# Patient Record
Sex: Female | Born: 1985 | Race: White | Hispanic: No | Marital: Married
Health system: Midwestern US, Academic
[De-identification: ages and names within clinical notes are randomized; demographics above are authoritative.]

---

## 2016-07-14 MED ORDER — ZOLPIDEM 10 MG PO TAB
ORAL_TABLET | Freq: Every day | ORAL | 0 refills | 30.00000 days | Status: DC | PRN
Start: 2016-07-14 — End: 2016-11-15

## 2016-07-21 MED ORDER — LIDOCAINE (PF) 10 MG/ML (1 %) IJ SOLN
5 mL | Freq: Once | SUBCUTANEOUS | 0 refills | Status: CP
Start: 2016-07-21 — End: ?

## 2016-07-21 MED ORDER — GOSERELIN 3.6 MG SC IMPL
3.6 mg | Freq: Once | SUBCUTANEOUS | 0 refills | Status: CP
Start: 2016-07-21 — End: ?

## 2016-08-16 MED ORDER — TAMOXIFEN 20 MG PO TAB
ORAL_TABLET | Freq: Every day | 1 refills | Status: DC
Start: 2016-08-16 — End: 2017-02-06

## 2016-08-19 MED ORDER — LIDOCAINE (PF) 10 MG/ML (1 %) IJ SOLN
5 mL | Freq: Once | SUBCUTANEOUS | 0 refills | Status: CP
Start: 2016-08-19 — End: ?

## 2016-08-19 MED ORDER — GOSERELIN 3.6 MG SC IMPL
3.6 mg | Freq: Once | SUBCUTANEOUS | 0 refills | Status: CP
Start: 2016-08-19 — End: ?

## 2016-09-12 ENCOUNTER — Encounter: Admit: 2016-09-12 | Discharge: 2016-09-12 | Payer: BC Managed Care – PPO

## 2016-09-12 DIAGNOSIS — C50919 Malignant neoplasm of unspecified site of unspecified female breast: Principal | ICD-10-CM

## 2016-09-12 NOTE — Telephone Encounter
Debra Fleming called wanting to change Debra Fleming Zoladex injection on Friday, June 22nd to Monday, June 25th. Appointment was rescheduled to 09/19/16 at 1100. Patient voiced understanding and had no further questions.

## 2016-09-13 ENCOUNTER — Ambulatory Visit: Admit: 2016-10-12 | Discharge: 2016-10-12 | Payer: BC Managed Care – PPO

## 2016-09-13 DIAGNOSIS — C50919 Malignant neoplasm of unspecified site of unspecified female breast: Secondary | ICD-10-CM

## 2016-09-19 ENCOUNTER — Encounter: Admit: 2016-09-19 | Discharge: 2016-09-19 | Payer: BC Managed Care – PPO

## 2016-09-19 DIAGNOSIS — Z5111 Encounter for antineoplastic chemotherapy: Principal | ICD-10-CM

## 2016-09-19 DIAGNOSIS — C50411 Malignant neoplasm of upper-outer quadrant of right female breast: ICD-10-CM

## 2016-09-19 DIAGNOSIS — Z9013 Acquired absence of bilateral breasts and nipples: ICD-10-CM

## 2016-09-19 DIAGNOSIS — Z79810 Long term (current) use of selective estrogen receptor modulators (SERMs): ICD-10-CM

## 2016-09-19 DIAGNOSIS — Z170 Estrogen receptor positive status [ER+]: ICD-10-CM

## 2016-09-19 DIAGNOSIS — C50511 Malignant neoplasm of lower-outer quadrant of right female breast: ICD-10-CM

## 2016-09-19 DIAGNOSIS — C50811 Malignant neoplasm of overlapping sites of right female breast: ICD-10-CM

## 2016-09-19 MED ORDER — LIDOCAINE (PF) 10 MG/ML (1 %) IJ SOLN
5 mL | Freq: Once | SUBCUTANEOUS | 0 refills | Status: CP
Start: 2016-09-19 — End: ?
  Administered 2016-09-19: 16:00:00 2 mL via SUBCUTANEOUS

## 2016-09-19 MED ORDER — GOSERELIN 3.6 MG SC IMPL
3.6 mg | Freq: Once | SUBCUTANEOUS | 0 refills | Status: CP
Start: 2016-09-19 — End: ?
  Administered 2016-09-19: 16:00:00 3.6 mg via SUBCUTANEOUS

## 2016-09-19 NOTE — Progress Notes
Patient received Zoladex injection and tolerated without difficulty.  No pertinent changes since last assessment.

## 2016-09-19 NOTE — Progress Notes
[facility] phone triage updates complete with patient for surgery on 10/12/16 with Dr. Rolanda Jay. She denies any major changes to medical hx or functional status since her last surgery on 04/01/16. She tolerated anesthesia well with that surgery but has a hx of severe PONV. Pt does not meet phone triage criteria for a [facility] appointment at this time.    Preoperative and medication instructions reviewed. Reminded no NSAIDs 7 days before surgery. NPO after 11 PM, may have water until two hours prior to check-in. Okay to take gabapentin, Tamoxifen, venlafaxine that morning with a sip of water as well. Pt verbalized understanding, declined needing a copy of these instructions sent to her.

## 2016-09-26 ENCOUNTER — Ambulatory Visit: Admit: 2016-09-26 | Discharge: 2016-09-26 | Payer: BC Managed Care – PPO

## 2016-09-26 ENCOUNTER — Encounter: Admit: 2016-09-26 | Discharge: 2016-09-26 | Payer: BC Managed Care – PPO

## 2016-09-26 DIAGNOSIS — N650 Deformity of reconstructed breast: ICD-10-CM

## 2016-09-26 DIAGNOSIS — K929 Disease of digestive system, unspecified: ICD-10-CM

## 2016-09-26 DIAGNOSIS — Z9221 Personal history of antineoplastic chemotherapy: ICD-10-CM

## 2016-09-26 DIAGNOSIS — Z789 Other specified health status: ICD-10-CM

## 2016-09-26 DIAGNOSIS — R112 Nausea with vomiting, unspecified: ICD-10-CM

## 2016-09-26 DIAGNOSIS — K219 Gastro-esophageal reflux disease without esophagitis: ICD-10-CM

## 2016-09-26 DIAGNOSIS — Z421 Encounter for breast reconstruction following mastectomy: Principal | ICD-10-CM

## 2016-09-26 DIAGNOSIS — C50911 Malignant neoplasm of unspecified site of right female breast: Principal | ICD-10-CM

## 2016-09-26 MED ORDER — DIAZEPAM 5 MG PO TAB
5 mg | ORAL_TABLET | ORAL | 0 refills | 7.00000 days | Status: AC | PRN
Start: 2016-09-26 — End: 2017-11-02

## 2016-09-26 MED ORDER — HYDROCODONE-ACETAMINOPHEN 5-325 MG PO TAB
1-2 | ORAL_TABLET | ORAL | 0 refills | 15.00000 days | Status: AC | PRN
Start: 2016-09-26 — End: 2017-06-26

## 2016-09-26 NOTE — Progress Notes
Debra Fleming is a 31 y.o. female.    Subjective:            Cancer Staging  Malignant neoplasm of upper-outer quadrant of right female breast [facility])  Staging form: Breast, AJCC 7th Edition  - Clinical: Stage IIA (T2, N0, cM0) - Signed by Cordelia Poche, MD on 09/26/2014  - Pathologic: Stage IIA (T2, N0, cM0) - Signed by Cordelia Poche, MD on 10/31/2014      HPI  Bilateral fat grafting from abdomen and thighs to breasts (right 280 cc and left 260 cc) with left breast mound revision and right breast reconstruction  (Bilateral)  DOS 04/01/16  Bilateral breast reconstruction with [id number] 05/04/2015  Have discussed ALCL  Anxious to exchange for larger implants that are smooth.  Review of Systems   Constitutional: Negative.    HENT: Negative.    Eyes: Negative.    Respiratory: Negative.    Cardiovascular: Negative.    Gastrointestinal: Negative.    Endocrine: Negative.    Genitourinary: Negative.    Musculoskeletal: Negative.    Skin: Negative.    Allergic/Immunologic: Negative.    Neurological: Negative.    Hematological: Negative.    Psychiatric/Behavioral: Negative.        Current Medication List:  ??? acetaminophen (TYLENOL) 500 mg tablet Take 500-1,000 mg by mouth every 6 hours as needed for Pain. Max of 4,000 mg of acetaminophen in 24 hours.   ??? amitriptyline (ELAVIL) 25 mg tablet Take 25 mg by mouth at bedtime as needed.   ??? diazePAM (VALIUM) 5 mg tablet Take 1 tablet by mouth every 8 hours as needed for Anxiety. Indications: MUSCLE SPASM   ??? gabapentin (NEURONTIN) 100 mg capsule Take 2 capsules by mouth every 8 hours.   ??? gabapentin (NEURONTIN) 300 mg capsule Take 3 capsules by mouth at bedtime daily.   ??? goserelin (ZOLADEX) 3.6 mg implant syringe Inject 3.6 mg under the skin once.   ??? LIDOCAINE HCL/PF (LIDOCAINE (PF) IJ) Inject  to area(s) as directed.   ??? SUMAtriptan succinate (IMITREX) 50 mg tablet Take 50 mg by mouth as Needed for Migraine symptoms. Dose may be repeated in 2 hours if needed. Max of 2 tablets in 24 hours.   ??? tamoxifen (NOLVADEX) 20 mg tablet TAKE ONE TABLET BY MOUTH ONCE DAILY   ??? venlafaxine XR (EFFEXOR XR) 37.5 mg capsule Take 1 capsule by mouth daily. Take with food.   ??? venlafaxine XR (EFFEXOR XR) 75 mg capsule Take 1 capsule by mouth daily. Take with food.   ??? zolpidem (AMBIEN) 10 mg tablet TAKE 1 TABLET BY MOUTH DAILY AT BEDTIME AS NEEDED FOR SLEEP     Vitals:    09/26/16 1131   BP: (!) 140/95   Pulse: 79   Weight: 89.4 kg (197 lb)   Height: 168.9 cm (66.5)     Body mass index is 31.32 kg/m???.       Physical Exam   Constitutional: She appears well-developed and well-nourished.   HENT:   Head: Normocephalic.   Neck: Normal range of motion. Neck supple.   Cardiovascular: Normal rate and regular rhythm.    Pulmonary/Chest: Effort normal and breath sounds normal.   Bilateral mastectomy with implants  17-18 cm max breast base width  Good soft tissue envelope  Axillary fold soft tissue excess   Abdominal: Soft. Bowel sounds are normal.   Psychiatric: She has a normal mood and affect. Her behavior is normal.   Nursing note and vitals reviewed.  Assessment and Plan:    The plan will be to proceed with bilateral breast mound revisions with implant exchange and liposuction of the axillary folds. The implants available will be Natrelle SCX-700, SCX-750 and SCX-800.  Will need 3 of each.  We discussed the expected result as well as the recovery.  Informed consent paperwork has been supplied.  She will be followed up in the office approximately 2 weeks postoperatively.

## 2016-10-11 ENCOUNTER — Ambulatory Visit: Admit: 2016-10-11 | Discharge: 2016-10-11 | Payer: BC Managed Care – PPO

## 2016-10-11 DIAGNOSIS — Z421 Encounter for breast reconstruction following mastectomy: Principal | ICD-10-CM

## 2016-10-12 ENCOUNTER — Encounter: Admit: 2016-10-12 | Discharge: 2016-10-12 | Payer: BC Managed Care – PPO

## 2016-10-12 ENCOUNTER — Ambulatory Visit: Admit: 2016-10-12 | Discharge: 2016-10-12 | Payer: BC Managed Care – PPO

## 2016-10-12 DIAGNOSIS — K929 Disease of digestive system, unspecified: ICD-10-CM

## 2016-10-12 DIAGNOSIS — Z9013 Acquired absence of bilateral breasts and nipples: ICD-10-CM

## 2016-10-12 DIAGNOSIS — Z9889 Other specified postprocedural states: ICD-10-CM

## 2016-10-12 DIAGNOSIS — Z789 Other specified health status: ICD-10-CM

## 2016-10-12 DIAGNOSIS — L987 Excessive and redundant skin and subcutaneous tissue: ICD-10-CM

## 2016-10-12 DIAGNOSIS — Z9221 Personal history of antineoplastic chemotherapy: ICD-10-CM

## 2016-10-12 DIAGNOSIS — Z421 Encounter for breast reconstruction following mastectomy: Principal | ICD-10-CM

## 2016-10-12 DIAGNOSIS — Z880 Allergy status to penicillin: ICD-10-CM

## 2016-10-12 DIAGNOSIS — C50911 Malignant neoplasm of unspecified site of right female breast: Principal | ICD-10-CM

## 2016-10-12 DIAGNOSIS — R112 Nausea with vomiting, unspecified: ICD-10-CM

## 2016-10-12 DIAGNOSIS — Z853 Personal history of malignant neoplasm of breast: ICD-10-CM

## 2016-10-12 DIAGNOSIS — K219 Gastro-esophageal reflux disease without esophagitis: ICD-10-CM

## 2016-10-12 LAB — PREGNANCY TEST-URINE
Lab: 1
Lab: NEGATIVE

## 2016-10-12 MED ORDER — LIDOCAINE (PF) 200 MG/10 ML (2 %) IJ SYRG
0 refills | Status: DC
Start: 2016-10-12 — End: 2016-10-12
  Administered 2016-10-12: 16:00:00 100 mg via INTRAVENOUS

## 2016-10-12 MED ORDER — OXYCODONE 5 MG PO TAB
5-10 mg | Freq: Once | ORAL | 0 refills | Status: CP | PRN
Start: 2016-10-12 — End: ?
  Administered 2016-10-12: 19:00:00 10 mg via ORAL

## 2016-10-12 MED ORDER — BUPIVACAINE-EPINEPHRINE 0.25 %-1:200,000 IJ SOLN
0 refills | Status: DC
Start: 2016-10-12 — End: 2016-10-12
  Administered 2016-10-12: 17:00:00 14 mL via INTRAMUSCULAR

## 2016-10-12 MED ORDER — FENTANYL CITRATE (PF) 50 MCG/ML IJ SOLN
25 ug | INTRAVENOUS | 0 refills | Status: DC | PRN
Start: 2016-10-12 — End: 2016-10-12

## 2016-10-12 MED ORDER — DIPHENHYDRAMINE HCL 50 MG/ML IJ SOLN
25 mg | Freq: Once | INTRAVENOUS | 0 refills | Status: DC | PRN
Start: 2016-10-12 — End: 2016-10-12

## 2016-10-12 MED ORDER — ORTHO IRRIGATION BOTTLE
0 refills | Status: DC
Start: 2016-10-12 — End: 2016-10-12
  Administered 2016-10-12 (×3): 1000 mL

## 2016-10-12 MED ORDER — CLINDAMYCIN IN 5 % DEXTROSE 900 MG/50 ML IV PGBK
0 refills | Status: DC
Start: 2016-10-12 — End: 2016-10-12
  Administered 2016-10-12: 17:00:00 900 mg via INTRAVENOUS

## 2016-10-12 MED ORDER — PROPOFOL 10 MG/ML IV EMUL (INFUSION)(AM)(OR)
INTRAVENOUS | 0 refills | Status: DC
Start: 2016-10-12 — End: 2016-10-12
  Administered 2016-10-12: 16:00:00 125 ug/kg/min via INTRAVENOUS

## 2016-10-12 MED ORDER — MUPIROCIN 2 % TP OINT
0 refills | Status: DC
Start: 2016-10-12 — End: 2016-10-12
  Administered 2016-10-12: 18:00:00 1 via TOPICAL

## 2016-10-12 MED ORDER — MEPERIDINE (PF) 25 MG/ML IJ SYRG
12.5 mg | INTRAVENOUS | 0 refills | Status: DC | PRN
Start: 2016-10-12 — End: 2016-10-12

## 2016-10-12 MED ORDER — KETAMINE 10 MG/ML IJ SOLN
0 refills | Status: DC
Start: 2016-10-12 — End: 2016-10-12
  Administered 2016-10-12: 17:00:00 10 mg via INTRAVENOUS
  Administered 2016-10-12: 17:00:00 20 mg via INTRAVENOUS

## 2016-10-12 MED ORDER — LACTATED RINGERS IV SOLP
1000 mL | INTRAVENOUS | 0 refills | Status: DC
Start: 2016-10-12 — End: 2016-10-12
  Administered 2016-10-12: 18:00:00 1000.000 mL via INTRAVENOUS

## 2016-10-12 MED ORDER — MIDAZOLAM 1 MG/ML IJ SOLN
INTRAVENOUS | 0 refills | Status: DC
Start: 2016-10-12 — End: 2016-10-12
  Administered 2016-10-12: 16:00:00 2 mg via INTRAVENOUS

## 2016-10-12 MED ORDER — LIDOCAINE (PF) 1% 30 ML/EPINEPHRINE 1MG/LR 1000 ML IRR (OR)
Freq: Once | 0 refills | Status: DC
Start: 2016-10-12 — End: 2016-10-12

## 2016-10-12 MED ORDER — PROMETHAZINE 25 MG/ML IJ SOLN
6.25 mg | INTRAVENOUS | 0 refills | Status: DC | PRN
Start: 2016-10-12 — End: 2016-10-12

## 2016-10-12 MED ORDER — FENTANYL CITRATE (PF) 50 MCG/ML IJ SOLN
50 ug | INTRAVENOUS | 0 refills | Status: DC | PRN
Start: 2016-10-12 — End: 2016-10-12
  Administered 2016-10-12: 19:00:00 50 ug via INTRAVENOUS

## 2016-10-12 MED ORDER — CEFAZOLIN INJ 1GM IVP
2 g | Freq: Once | INTRAVENOUS | 0 refills | Status: DC
Start: 2016-10-12 — End: 2016-10-12

## 2016-10-12 MED ORDER — ONDANSETRON HCL (PF) 4 MG/2 ML IJ SOLN
INTRAVENOUS | 0 refills | Status: DC
Start: 2016-10-12 — End: 2016-10-12
  Administered 2016-10-12: 18:00:00 4 mg via INTRAVENOUS

## 2016-10-12 MED ORDER — LIDOCAINE (PF) 1% 30 ML/EPINEPHRINE 1MG/LR 1000 ML IRR (OR)
0 refills | Status: DC
Start: 2016-10-12 — End: 2016-10-12
  Administered 2016-10-12 (×3): 300 mL

## 2016-10-12 MED ORDER — HALOPERIDOL LACTATE 5 MG/ML IJ SOLN
1 mg | Freq: Once | INTRAVENOUS | 0 refills | Status: DC | PRN
Start: 2016-10-12 — End: 2016-10-12

## 2016-10-12 MED ORDER — HYDROMORPHONE (PF) 2 MG/ML IJ SYRG
1 mg | INTRAVENOUS | 0 refills | Status: DC | PRN
Start: 2016-10-12 — End: 2016-10-12

## 2016-10-12 MED ORDER — FENTANYL CITRATE (PF) 50 MCG/ML IJ SOLN
0 refills | Status: DC
Start: 2016-10-12 — End: 2016-10-12
  Administered 2016-10-12 (×4): 25 ug via INTRAVENOUS
  Administered 2016-10-12 (×2): 50 ug via INTRAVENOUS

## 2016-10-12 MED ORDER — ACETAMINOPHEN 500 MG PO TAB
1000 mg | Freq: Once | ORAL | 0 refills | Status: CP
Start: 2016-10-12 — End: ?
  Administered 2016-10-12: 15:00:00 1000 mg via ORAL

## 2016-10-12 MED ORDER — LIDOCAINE (PF) 10 MG/ML (1 %) IJ SOLN
.1-2 mL | INTRAMUSCULAR | 0 refills | Status: DC | PRN
Start: 2016-10-12 — End: 2016-10-12

## 2016-10-12 MED ORDER — DEXAMETHASONE SODIUM PHOSPHATE 4 MG/ML IJ SOLN
INTRAVENOUS | 0 refills | Status: DC
Start: 2016-10-12 — End: 2016-10-12
  Administered 2016-10-12: 18:00:00 4 mg via INTRAVENOUS

## 2016-10-12 MED ORDER — PROPOFOL INJ 10 MG/ML IV VIAL
INTRAVENOUS | 0 refills | Status: DC
Start: 2016-10-12 — End: 2016-10-12
  Administered 2016-10-12: 17:00:00 70 mg via INTRAVENOUS
  Administered 2016-10-12: 16:00:00 10 mg via INTRAVENOUS

## 2016-10-12 MED ADMIN — LACTATED RINGERS IV SOLP [4318]: 1000 mL | INTRAVENOUS | @ 15:00:00 | Stop: 2016-10-12 | NDC 00338011704

## 2016-10-12 NOTE — Anesthesia Post-Procedure Evaluation
Post-Anesthesia Evaluation    Name: Debra Fleming      MRN: [id number]     DOB: 1985/04/10     Age: 31 y.o.     Sex: female   __________________________________________________________________________     Procedure Date: 10/12/2016  Procedure: Procedure(s) with comments:  BILATERAL BREAST REVISION WITH CASULOTOMY AND IMPLANT EXCHANGE - CASE LENGTH 2 HOURS  LIPOSUCTION TO AXILLARY FOLDS      Surgeon: Surgeon(s):  Marylyn Ishihara, MD  Korentager, Nanine Means, MD    Post-Anesthesia Vitals  BP: 147/81 (07/18 1345)  Temp: 36.6 C (97.9 F) (07/18 1322)  Pulse: 92 (07/18 1345)  Respirations: 16 PER MINUTE (07/18 1345)  SpO2: 100 % (07/18 1345)  O2 Delivery: None (Room Air) (07/18 1345)  SpO2 Pulse: 95 (07/18 1345)      Post Anesthesia Evaluation Note    Evaluation location: Pre/Post  Patient participation: recovered; patient participated in evaluation  Level of consciousness: alert  Pain management: adequate    Hydration: normovolemia  Temperature: 36.0C - 38.4C  Airway patency: adequate    Perioperative Events  Perioperative events:  no       Post-op nausea and vomiting: no PONV    Postoperative Status  Cardiovascular status: hemodynamically stable  Respiratory status: spontaneous ventilation  Follow-up needed: none      Perioperative Events  Perioperative Event: No

## 2016-10-12 NOTE — Progress Notes
Discharge instructions given to patient and family members. No further questions at this time. All verbalized understanding.

## 2016-10-12 NOTE — Operative Report(Direct Entry)
OPERATIVE REPORT    Name: Debra Fleming is a 31 y.o. female     DOB: 1985-10-05             MRN#: [id number]    DATE OF OPERATION: 10/12/2016    Surgeon(s) and Role:     Cleda Clarks, MD - Resident - Assisting     * Berna Gitto, Pearletha Furl, MD - Primary        Preoperative Diagnosis:    Malignant neoplasm of female breast, unspecified estrogen receptor status, unspecified laterality, unspecified site of breast (HCC) [C50.919]    Post-op Diagnosis      * Malignant neoplasm of female breast, unspecified estrogen receptor status, unspecified laterality, unspecified site of breast (HCC) [C50.919]    Procedure(s) (LRB):  BILATERAL BREAST MOUND REVISIONS WITH CAPSULOTOMIES AND IMPLANT EXCHANGE FOR NATRELLE INSPIRA SCX-800 (Bilateral)  LIPOSUCTION TO AXILLARY FOLDS (Bilateral)    Anesthesia Type: General    Description and Findings of Operative Procedure: The patient was taken to the operating room put to sleep with a general anesthetic and intubated with a laryngeal mask.  Initially on the left and then on the right the proposed incisions were infiltrated with 0.25% Marcaine with epinephrine.  The areas of the redundant adipose in the axillary fold region was infiltrated with tumescent solution.  Again first on the left and then on the right the old mastectomy scar was excised soft tissues divided exposing the capsule.  The capsule was entered and there was found to be a moderate amount of mucinous seroma fluid.  There is no indication of any infection.  The 410-615 implants show double capsule formation on the back wall.  They were removed and then the pockets copiously irrigated with antibiotic solution.  Medial superior and superior lateral capsulotomies were then completed.  The pockets were irrigated once again and any bleeding controlled with pressure and cautery.  Next the axillary fold regions were liposuction with a 3 mm cannula and a total of 300 cc was aspirated.  Approximately half of this was adipose.  Sizers were then used and I felt that the best option would be an 800 cc implant for her.  Two Natrelle Inspira cohesive breast implants were brought out onto the field.  They had a reference number of SCX???800 and the right had a lot number of [id number] and the left [id number].  They were placed in antibiotic solution.  After final check for hemostasis we changed gloves and reprepped with Betadine.  10 cc of the antibiotic Marcaine solution was placed into each pocket.  Implants were placed into their respective pockets.  This gave a very nice symmetric result.  Wounds were then closed utilizing 3-0 Monocryl running locking suture for the ADM that was well incorporated and 3-0 Monocryl as a deep dermal suture.  4 oh intracuticular Monocryl was used for skin.  The axillary stab incisions were closed with 4-0 Monocryl.  She was then placed in the dressing with Therabond and Tegaderm along with antibiotic ointment and Band-Aids over the axillary liposuction sites.  She was placed into a postoperative bra.  She was then awakened taken recovery in stable condition.    Estimated Blood Loss: min.     Specimen(s) Removed/Disposition:   ID Type Source Tests Collected by Time Destination   1 : breast implants for disposal only Tissue Breast SURGICAL PATHOLOGY          Evianna Chandran, Pearletha Furl, MD 10/12/2016 1240  Attestation: I performed this procedure with a resident.    Ardelle Anton, MD  Pager [phone]

## 2016-10-12 NOTE — Interval H&P Note
History and Physical Update Note    Allergies:  Other [unclassified drug] and Penicillin g    Lab/Radiology/Other Diagnostic Tests:  No pertinent labs  Point of Care Testing:  (Last 24 hours):     Marked and ready for surgery    I have examined the patient, and there are no significant changes in their condition, from the previous H&P performed on in the office.    Ardelle Anton, MD  Pager [phone]    --------------------------------------------------------------------------------------------------------------------------------------------

## 2016-10-13 ENCOUNTER — Encounter: Admit: 2016-10-13 | Discharge: 2016-10-13 | Payer: BC Managed Care – PPO

## 2016-10-13 MED ORDER — DOXYCYCLINE HYCLATE 100 MG PO TAB
100 mg | ORAL_TABLET | Freq: Two times a day (BID) | ORAL | 0 refills | 8.00000 days | Status: AC
Start: 2016-10-13 — End: ?

## 2016-10-14 ENCOUNTER — Ambulatory Visit: Admit: 2016-10-14 | Discharge: 2016-10-14 | Payer: BC Managed Care – PPO

## 2016-10-14 ENCOUNTER — Encounter: Admit: 2016-10-14 | Discharge: 2016-10-14 | Payer: BC Managed Care – PPO

## 2016-10-14 DIAGNOSIS — Z421 Encounter for breast reconstruction following mastectomy: Principal | ICD-10-CM

## 2016-10-14 DIAGNOSIS — K929 Disease of digestive system, unspecified: ICD-10-CM

## 2016-10-14 DIAGNOSIS — Z79810 Long term (current) use of selective estrogen receptor modulators (SERMs): ICD-10-CM

## 2016-10-14 DIAGNOSIS — K219 Gastro-esophageal reflux disease without esophagitis: ICD-10-CM

## 2016-10-14 DIAGNOSIS — R112 Nausea with vomiting, unspecified: ICD-10-CM

## 2016-10-14 DIAGNOSIS — Z789 Other specified health status: ICD-10-CM

## 2016-10-14 DIAGNOSIS — Z170 Estrogen receptor positive status [ER+]: ICD-10-CM

## 2016-10-14 DIAGNOSIS — Z9221 Personal history of antineoplastic chemotherapy: ICD-10-CM

## 2016-10-14 DIAGNOSIS — Z9013 Acquired absence of bilateral breasts and nipples: ICD-10-CM

## 2016-10-14 DIAGNOSIS — C50811 Malignant neoplasm of overlapping sites of right female breast: Principal | ICD-10-CM

## 2016-10-14 DIAGNOSIS — C50911 Malignant neoplasm of unspecified site of right female breast: Principal | ICD-10-CM

## 2016-10-14 DIAGNOSIS — C50511 Malignant neoplasm of lower-outer quadrant of right female breast: ICD-10-CM

## 2016-10-14 DIAGNOSIS — C50411 Malignant neoplasm of upper-outer quadrant of right female breast: Secondary | ICD-10-CM

## 2016-10-14 MED ORDER — LIDOCAINE (PF) 10 MG/ML (1 %) IJ SOLN
5 mL | Freq: Once | SUBCUTANEOUS | 0 refills | Status: CP
Start: 2016-10-14 — End: ?
  Administered 2016-10-14: 16:00:00 5 mL via SUBCUTANEOUS

## 2016-10-14 MED ORDER — GOSERELIN 3.6 MG SC IMPL
3.6 mg | Freq: Once | SUBCUTANEOUS | 0 refills | Status: CP
Start: 2016-10-14 — End: ?
  Administered 2016-10-14: 16:00:00 3.6 mg via SUBCUTANEOUS

## 2016-10-14 NOTE — Progress Notes
Patient received Zoladex and tolerated without difficulty.  No pertinent changes since last assessment.

## 2016-10-14 NOTE — Progress Notes
Subjective:  Debra Fleming presents to the clinic 1 weeks following BILATERAL BREAST MOUND REVISIONS WITH CAPSULOTOMIES AND IMPLANT EXCHANGE FOR NATRELLE INSPIRA SCX-800 (Bilateral) . She is eating a regular diet without difficulty. Bowel movement are Normal. Pain is controlled with current analgesics.  Medication(s) being used: acetaminophen    Objective:  BP 126/87  - Pulse 102  - Ht 168.9 cm (66.5")  - Wt 89.4 kg (197 lb)  - BMI 31.32 kg/m   General: Appearance: alert and cooperative     Body Area:         Site: Breasts  Drains: No  Signs or symptoms of infection -             (erythema, induration, heat, pain): No  Seroma/hematoma: No  Drainage: yes - type:  serous and on the right likely coming from the axillary tumescent, amount -  minimally saturated  Periwound/surrounding skin: WNL  Incisions good, no separation, no necrotic edges     Assessment:  Doing well postoperatively.    Plan:  1. Continue any current medications.  2. Wound care: redressed.  3. Pt is to increase activities as tolerated.

## 2016-10-28 ENCOUNTER — Encounter: Admit: 2016-10-28 | Discharge: 2016-10-28 | Payer: BC Managed Care – PPO

## 2016-10-28 MED ORDER — FLUCONAZOLE 150 MG PO TAB
150 mg | ORAL_TABLET | Freq: Once | ORAL | 1 refills | 3.00000 days | Status: AC
Start: 2016-10-28 — End: ?

## 2016-10-31 ENCOUNTER — Ambulatory Visit: Admit: 2016-10-31 | Discharge: 2016-11-01 | Payer: BC Managed Care – PPO

## 2016-10-31 ENCOUNTER — Encounter: Admit: 2016-10-31 | Discharge: 2016-10-31 | Payer: BC Managed Care – PPO

## 2016-10-31 DIAGNOSIS — K929 Disease of digestive system, unspecified: ICD-10-CM

## 2016-10-31 DIAGNOSIS — K219 Gastro-esophageal reflux disease without esophagitis: ICD-10-CM

## 2016-10-31 DIAGNOSIS — R112 Nausea with vomiting, unspecified: ICD-10-CM

## 2016-10-31 DIAGNOSIS — Z9221 Personal history of antineoplastic chemotherapy: ICD-10-CM

## 2016-10-31 DIAGNOSIS — Z789 Other specified health status: ICD-10-CM

## 2016-10-31 DIAGNOSIS — C50911 Malignant neoplasm of unspecified site of right female breast: Principal | ICD-10-CM

## 2016-10-31 NOTE — Progress Notes
Debra Fleming is a 31 y.o. female.    Subjective:    HPI  BILATERAL BREAST MOUND REVISIONS WITH CAPSULOTOMIES AND IMPLANT EXCHANGE FOR NATRELLE INSPIRA SCX-800 (Bilateral)  LIPOSUCTION TO AXILLARY FOLDS (Bilateral)  Pleased and doing well  Review of Systems   Constitutional: Positive for diaphoresis and malaise/fatigue.        UNEXPECTED WEIGHT CHANGE   Psychiatric/Behavioral: The patient is nervous/anxious.    All other systems reviewed and are negative.      Current Outpatient Prescriptions   Medication Sig Dispense Refill   ??? amitriptyline (ELAVIL) 25 mg tablet Take 25 mg by mouth at bedtime as needed.     ??? diazePAM (VALIUM) 5 mg tablet Take 1 tablet by mouth every 8 hours as needed for Anxiety. 20 tablet 0   ??? gabapentin (NEURONTIN) 100 mg capsule Take 2 capsules by mouth every 8 hours. 120 capsule 3   ??? gabapentin (NEURONTIN) 300 mg capsule Take 3 capsules by mouth at bedtime daily. 270 capsule 2   ??? goserelin (ZOLADEX) 3.6 mg implant syringe Inject 3.6 mg under the skin once.     ??? HYDROcodone/acetaminophen (NORCO) 5/325 mg tablet Take 1-2 tablets by mouth every 4 hours as needed for Pain 25 tablet 0   ??? SUMAtriptan succinate (IMITREX) 50 mg tablet Take 50 mg by mouth as Needed for Migraine symptoms. Dose may be repeated in 2 hours if needed. Max of 2 tablets in 24 hours.     ??? tamoxifen (NOLVADEX) 20 mg tablet TAKE ONE TABLET BY MOUTH ONCE DAILY 90 tablet 1   ??? venlafaxine XR (EFFEXOR XR) 37.5 mg capsule Take 1 capsule by mouth daily. Take with food. 90 capsule 3   ??? venlafaxine XR (EFFEXOR XR) 75 mg capsule Take 1 capsule by mouth daily. Take with food. 90 capsule 3   ??? zolpidem (AMBIEN) 10 mg tablet TAKE 1 TABLET BY MOUTH DAILY AT BEDTIME AS NEEDED FOR SLEEP 30 tablet 0       Objective:    Vitals:    10/31/16 1621   BP: 135/83   Pulse: 87   Resp: 16   Temp: 36.7 ???C (98.1 ???F)   TempSrc: Oral   SpO2: 98%   Weight: 98.2 kg (216 lb 6.4 oz)   Height: 168.9 cm (66.5) Estimated body mass index is 34.4 kg/m??? as calculated from the following:    Height as of this encounter: 168.9 cm (66.5).    Weight as of this encounter: 98.2 kg (216 lb 6.4 oz).   Physical Exam  Shape and contour very good  Incisions good  Assessment:    No diagnosis found.    Plan:  Check in 4-6 months  No problem-specific Assessment & Plan notes found for this encounter.

## 2016-11-01 DIAGNOSIS — Z421 Encounter for breast reconstruction following mastectomy: Principal | ICD-10-CM

## 2016-11-08 ENCOUNTER — Encounter: Admit: 2016-11-08 | Discharge: 2016-11-08 | Payer: BC Managed Care – PPO

## 2016-11-10 ENCOUNTER — Encounter: Admit: 2016-11-10 | Discharge: 2016-11-10 | Payer: BC Managed Care – PPO

## 2016-11-10 NOTE — Telephone Encounter
Patient cancelling appointment today due to strep. Patient rescheduled to 8/23 patient accepted the date and time of the appointment and has no further questions at this time. Clinic notified of cancellation

## 2016-11-14 ENCOUNTER — Encounter: Admit: 2016-11-14 | Discharge: 2016-11-14 | Payer: BC Managed Care – PPO

## 2016-11-15 MED ORDER — GABAPENTIN 300 MG PO CAP
900 mg | ORAL_CAPSULE | Freq: Every evening | ORAL | 2 refills | Status: AC
Start: 2016-11-15 — End: 2017-07-14

## 2016-11-15 MED ORDER — ZOLPIDEM 10 MG PO TAB
ORAL_TABLET | Freq: Every day | ORAL | 3 refills | 30.00000 days | Status: AC | PRN
Start: 2016-11-15 — End: 2016-11-18

## 2016-11-16 ENCOUNTER — Encounter: Admit: 2016-11-16 | Discharge: 2016-11-16 | Payer: BC Managed Care – PPO

## 2016-11-17 ENCOUNTER — Encounter: Admit: 2016-11-17 | Discharge: 2016-11-17 | Payer: BC Managed Care – PPO

## 2016-11-17 DIAGNOSIS — C50811 Malignant neoplasm of overlapping sites of right female breast: Principal | ICD-10-CM

## 2016-11-17 DIAGNOSIS — C50411 Malignant neoplasm of upper-outer quadrant of right female breast: ICD-10-CM

## 2016-11-17 DIAGNOSIS — C50511 Malignant neoplasm of lower-outer quadrant of right female breast: ICD-10-CM

## 2016-11-17 DIAGNOSIS — Z79810 Long term (current) use of selective estrogen receptor modulators (SERMs): ICD-10-CM

## 2016-11-17 DIAGNOSIS — Z801 Family history of malignant neoplasm of trachea, bronchus and lung: ICD-10-CM

## 2016-11-17 DIAGNOSIS — Z170 Estrogen receptor positive status [ER+]: ICD-10-CM

## 2016-11-17 DIAGNOSIS — Z9013 Acquired absence of bilateral breasts and nipples: ICD-10-CM

## 2016-11-17 LAB — ESTRADIOL (E2): Lab: <20 pg/mL

## 2016-11-17 MED ORDER — LIDOCAINE (PF) 10 MG/ML (1 %) IJ SOLN
2 mL | Freq: Once | SUBCUTANEOUS | 0 refills | Status: CP
Start: 2016-11-17 — End: ?
  Administered 2016-11-17: 18:00:00 2 mL via SUBCUTANEOUS

## 2016-11-17 MED ORDER — GOSERELIN 3.6 MG SC IMPL
3.6 mg | Freq: Once | SUBCUTANEOUS | 0 refills | Status: CP
Start: 2016-11-17 — End: ?
  Administered 2016-11-17: 18:00:00 3.6 mg via SUBCUTANEOUS

## 2016-11-17 NOTE — Progress Notes
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  Debra Fleming felt a right breast lump in April 2016 and when it started to increase in size, she went to her PCP. Bilateral diagnostic mammogram 09/08/14 ([facility]) revealed a 2.3 cm irregular mass 9:00, 7 cm FTN in the right breast.  There were a few other focal symmetries in the right breast at 10:00, 12:00, and 6:00. In the left breast there was a 7 mm asymmetry in the lower inner quadrant, 7 cm FTN. Bilateral breast ultrasound 09/08/14 ([facility]) revealed in the right breast a 2.3 cm irregular hypoechoic mass at 9:00. There was also a 1.5 cm mass at 10:00, 7 cm FTN. Additional suspicious findings were noted at 6:00, 7 cm FTN measuring 7 mm and 12:00, 4 cm FTN measuring 5 mm. The right axilla appeared normal. In the left breast there was a 7 mm hypoechoic cluster of cysts with no internal vascularity. Right breast biopsy was recommended. A 6 month follow up left breast ultrasound was recommended (BI-RADS 5).      [facility]11[facility]14.  Bilateral breast US 09/11/14 revealed an irregular right breast mass measuring up to 3.5cm at 9:00, 7cm FTN.  There was also an 8mm area at 10:00, 5cm FTN, a 4mm area at 12:00 4cm FTN, 7mm focus at 6:00 4cm FTN, 1cm area at 12:00 2cm FTN, and at 12:30 subareolar ,measuring 2 cm x 0.5 cm.  The left breast revealed a 9x4x20mm mass at 9:30, 2cm FTN.     She had three core needle biopsies 09/11/14.  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  Right breast biopsy at 12:00, 2cm FTN revealed usual ductal hyperplasia, no evidence of malignancy.  Right breast biopsy at 9:00, 7cm FTN revealed grade II IDC (ER 85%, PR 98%, Her2 1+, Hi-67: 6%). She had a bilateral mastectomy on 10/22/14 which revealed multicentric right, grade II, IDC. Tumor #1 at 9:00 measured 2.3cm (ER 90%, PR 100%, Her2 2+, FISH: 1.5 with avg #Her2 signals 4.01 (negative), Ki-67: 6%).  Tumor #2 at 6:00 measured 6 mm (ER 96%, PR 100%, Her2 1+, Ki-67 7%).  Tumor #3 (found incidentally) in the Left outer quadrant measured 2mm (ER and PR 100%, HER2 IHC 2+, Ki-67 17%, HER2 FISH negative); 4 negative SLN.  There was a distance of 2.5cm between the first and second mass.  Distance between second and the third lesion are unknown.     She received 4 cycles of adjuvant AC (8/18-10/8/16) followed by Taxol 10/24-12/29/16 - last dose was dose dense.    PRESENT THERAPY: Tamoxifen (started 06/2015), zoladex 12/2015 (rise in E2)    CHANGES SINCE LAST INTERVENTION: Debra Fleming returns to clinic for follow-up. She continues on monthly zoladex and tamoxifen Fleming.         Review of Systems   Constitutional: Positive for fatigue (stable). Negative for unexpected weight change.        Hot flashes stable     HENT: Negative.    Eyes: Negative.  Negative for visual disturbance.   Respiratory: Negative.  Negative for cough and shortness of breath.    Cardiovascular: Negative.  Negative for  chest pain.   Gastrointestinal: Negative.  Negative for abdominal pain, constipation, diarrhea, nausea and vomiting.   Endocrine: Negative.    Genitourinary: Negative.  Negative for difficulty urinating.   Musculoskeletal: Negative.  Negative for arthralgias and back pain.   Skin: Negative.  Negative for rash.   Allergic/Immunologic: Negative.    Neurological: Positive for numbness (stable). Negative for dizziness, weakness and headaches.   Hematological: Negative.    Psychiatric/Behavioral: Positive for sleep disturbance (improved).       PMH: Negative  FAMILY HX: No family history of breast cancer.  Paternal grandmother (fallopian ca- genetic test negative). Maternal grandmother and grandfather with lung cancer. Maternal uncle throat cancer (age 54).  GYN: Menarche age 68. G2P2, ovaries/uterus intact.  On birth control x10 years, stopped at diagnosis  SOCIAL:  Hair dresser, married, lives in [place]    Objective:         ??? amitriptyline (ELAVIL) 25 mg tablet Take 25 mg by mouth at bedtime as needed.   ??? diazePAM (VALIUM) 5 mg tablet Take 1 tablet by mouth every 8 hours as needed for Anxiety.   ??? gabapentin (NEURONTIN) 100 mg capsule Take 2 capsules by mouth every 8 hours.   ??? gabapentin (NEURONTIN) 300 mg capsule TAKE 3 CAPSULES BY MOUTH AT BEDTIME Fleming.   ??? goserelin (ZOLADEX) 3.6 mg implant syringe Inject 3.6 mg under the skin once.   ??? HYDROcodone/acetaminophen (NORCO) 5/325 mg tablet Take 1-2 tablets by mouth every 4 hours as needed for Pain   ??? SUMAtriptan succinate (IMITREX) 50 mg tablet Take 50 mg by mouth as Needed for Migraine symptoms. Dose may be repeated in 2 hours if needed. Max of 2 tablets in 24 hours.   ??? tamoxifen (NOLVADEX) 20 mg tablet TAKE ONE TABLET BY MOUTH ONCE Fleming   ??? venlafaxine XR (EFFEXOR XR) 37.5 mg capsule Take 1 capsule by mouth Fleming. Take with food.   ??? venlafaxine XR (EFFEXOR XR) 75 mg capsule Take 1 capsule by mouth Fleming. Take with food.   ??? zolpidem (AMBIEN) 10 mg tablet TAKE 1 TABLET BY MOUTH Fleming AT BEDTIME AS NEEDED FOR SLEEP     There were no vitals filed for this visit.    There is no height or weight on file to calculate BMI.           Pain Addressed:  N/A    Patient Evaluated for a Clinical Trial: Patient not eligible for a treatment trial (including not needing treatment, needs palliative care, in remission).     [place] Cooperative Oncology Group performance status is 0, Fully active, able to carry on all pre-disease performance without restriction.Marland Kitchen     Physical Exam   Constitutional: She is oriented to person, place, and time. She appears well-developed and well-nourished. No distress.   HENT:   Head: Normocephalic and atraumatic. Eyes: Conjunctivae and EOM are normal. No scleral icterus.   Neck: Normal range of motion.   Cardiovascular: Normal rate, regular rhythm and normal heart sounds.    Pulmonary/Chest: Effort normal and breath sounds normal. She has no wheezes. She exhibits no tenderness.       Musculoskeletal: Normal range of motion. She exhibits no edema or tenderness.   Lymphadenopathy:     She has no cervical adenopathy.     She has no axillary adenopathy.        Right: No supraclavicular adenopathy present.        Left: No supraclavicular adenopathy present.   Neurological: She is  alert and oriented to person, place, and time. No cranial nerve deficit. Coordination normal.   Skin: Skin is warm and dry. No rash noted. No erythema.   Psychiatric: She has a normal mood and affect. Her behavior is normal. Judgment and thought content normal.   Vitals reviewed.            E2 05/14/15: 23  E2 07/02/15: 41  E2 10/01/15: 23  E2 12/31/15: 34  E2 07/21/16: 29  E2 11/17/16: <20           Assessment and Plan:  1.  31 y.o. pre-menopausal female with right, multicentric grade II IDC (ER 85%, PR 98%, Her2 1+, Hi-67: 6%).  S/P bilateral mastectomy/R SLNB, pT2N0.  She was found to have 3 areas of malignancy: Tumor #1 at 9:00 measured 2.3cm (ER 90%, PR 100%, Her2 2+, FISH: 1.5 with avg #Her2 signals 4.01 (negative), Ki-67: 6%).  Tumor #2 at 6:00 measured 6 mm (ER 96%, PR 100%, Her2 1+, Ki-67 7%).  Tumor #3 in the Left outer quadrant measured 2mm (ER and PR 100%, HER2 IHC 2+, Ki-67 17%, HER2 FISH negative); 4 negative SLN. Completed adjuvant AC-Taxol 02/2015.  Started adjuvant tamoxifen in 04/2015 and OFS in 12/2015 for rising E2.  Continue with tamoxifen as menopausal symptoms are still bothersome.    2. Long term plan includes 5-10 years of endocrine therapy.  E2 prior to starting tamoxifen was 23. Level from 09/2015 was 23 and was 220 on 12/31/15. At that time, we started Zoladex for OFS. Tolerating reasonably well, except for hot flashes. 3. Hot flashes: Increase Effexor to 112.5 mg PO QDAY and Gabapentin 900 mg PO QHS.      4. 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  Also takes lorazepam at night.     5.  Genetic testing. [employer] 26 gene panel testing was negative.    6. Weight gain:  Patient reports 30-45 minutes of exercise 5 days a week and reports eating mostly protein and vegetables. Encouraged her to keep a food diary.     RTC in 6 months with zoladex       Marylene Land, APRN

## 2016-11-17 NOTE — Progress Notes
Patient received zoladex and tolerated without difficulty.  No pertinent changes since last assessment.

## 2016-11-18 MED ORDER — ZOLPIDEM 10 MG PO TAB
ORAL_TABLET | Freq: Every day | ORAL | 3 refills | 30.00000 days | Status: AC | PRN
Start: 2016-11-18 — End: 2017-03-07

## 2016-12-13 ENCOUNTER — Encounter: Admit: 2016-12-13 | Discharge: 2016-12-13 | Payer: BC Managed Care – PPO

## 2016-12-13 MED ORDER — GABAPENTIN 100 MG PO CAP
200 mg | ORAL_CAPSULE | ORAL | 3 refills | Status: AC
Start: 2016-12-13 — End: 2017-02-06

## 2016-12-16 ENCOUNTER — Encounter: Admit: 2016-12-16 | Discharge: 2016-12-16 | Payer: BC Managed Care – PPO

## 2016-12-16 DIAGNOSIS — C50411 Malignant neoplasm of upper-outer quadrant of right female breast: Principal | ICD-10-CM

## 2016-12-16 DIAGNOSIS — Z170 Estrogen receptor positive status [ER+]: ICD-10-CM

## 2016-12-16 DIAGNOSIS — Z79818 Long term (current) use of other agents affecting estrogen receptors and estrogen levels: ICD-10-CM

## 2016-12-16 MED ORDER — GOSERELIN 3.6 MG SC IMPL
3.6 mg | Freq: Once | SUBCUTANEOUS | 0 refills | Status: CP
Start: 2016-12-16 — End: ?
  Administered 2016-12-16: 20:00:00 3.6 mg via SUBCUTANEOUS

## 2016-12-16 MED ORDER — LIDOCAINE (PF) 10 MG/ML (1 %) IJ SOLN
5 mL | Freq: Once | SUBCUTANEOUS | 0 refills | Status: CP
Start: 2016-12-16 — End: ?
  Administered 2016-12-16: 20:00:00 5 mL via SUBCUTANEOUS

## 2016-12-16 NOTE — Progress Notes
Patient received Zoladex and tolerated without difficulty.  No pertinent changes since last assessment. Pt stable and denies any other needs. Pt left ambulatory at 1517.

## 2017-01-02 ENCOUNTER — Encounter: Admit: 2017-01-02 | Discharge: 2017-01-02 | Payer: BC Managed Care – PPO

## 2017-01-02 MED ORDER — GABAPENTIN 100 MG PO CAP
200 mg | ORAL_CAPSULE | ORAL | 3 refills
Start: 2017-01-02 — End: ?

## 2017-01-13 ENCOUNTER — Encounter: Admit: 2017-01-13 | Discharge: 2017-01-13 | Payer: BC Managed Care – PPO

## 2017-01-13 DIAGNOSIS — Z79810 Long term (current) use of selective estrogen receptor modulators (SERMs): ICD-10-CM

## 2017-01-13 DIAGNOSIS — Z9013 Acquired absence of bilateral breasts and nipples: ICD-10-CM

## 2017-01-13 DIAGNOSIS — C50811 Malignant neoplasm of overlapping sites of right female breast: Principal | ICD-10-CM

## 2017-01-13 DIAGNOSIS — C50511 Malignant neoplasm of lower-outer quadrant of right female breast: ICD-10-CM

## 2017-01-13 DIAGNOSIS — Z170 Estrogen receptor positive status [ER+]: ICD-10-CM

## 2017-01-13 DIAGNOSIS — C50411 Malignant neoplasm of upper-outer quadrant of right female breast: ICD-10-CM

## 2017-01-13 MED ORDER — LIDOCAINE (PF) 10 MG/ML (1 %) IJ SOLN
5 mL | Freq: Once | SUBCUTANEOUS | 0 refills | Status: CP
Start: 2017-01-13 — End: ?
  Administered 2017-01-13: 18:00:00 5 mL via SUBCUTANEOUS

## 2017-01-13 MED ORDER — GOSERELIN 3.6 MG SC IMPL
3.6 mg | Freq: Once | SUBCUTANEOUS | 0 refills | Status: CP
Start: 2017-01-13 — End: ?
  Administered 2017-01-13: 18:00:00 3.6 mg via SUBCUTANEOUS

## 2017-01-13 NOTE — Progress Notes
Patient received lidocaine PF 1% (10 mg/mL) injection 5 mL      and tolerated without difficulty.  No pertinent changes since last assessment. INjection to RLQ

## 2017-01-30 ENCOUNTER — Encounter: Admit: 2017-01-30 | Discharge: 2017-01-30 | Payer: BC Managed Care – PPO

## 2017-01-30 NOTE — Telephone Encounter
Debra Fleming called to have her Zoladex injections on 11/16 and 12/14 moved up earlier in the day. Left message for Debra Fleming stating her 11/16 appt is scheduled for 0730 and her 12/14 appt is scheduled for 1130.

## 2017-02-06 ENCOUNTER — Encounter: Admit: 2017-02-06 | Discharge: 2017-02-06 | Payer: BC Managed Care – PPO

## 2017-02-06 MED ORDER — TAMOXIFEN 20 MG PO TAB
ORAL_TABLET | Freq: Every day | 1 refills | Status: AC
Start: 2017-02-06 — End: 2017-07-24

## 2017-02-06 MED ORDER — GABAPENTIN 100 MG PO CAP
200 mg | ORAL_CAPSULE | ORAL | 3 refills | Status: AC
Start: 2017-02-06 — End: 2017-06-05

## 2017-02-10 ENCOUNTER — Encounter: Admit: 2017-02-10 | Discharge: 2017-02-10 | Payer: BC Managed Care – PPO

## 2017-02-10 DIAGNOSIS — C50811 Malignant neoplasm of overlapping sites of right female breast: Principal | ICD-10-CM

## 2017-02-10 DIAGNOSIS — Z79810 Long term (current) use of selective estrogen receptor modulators (SERMs): ICD-10-CM

## 2017-02-10 DIAGNOSIS — C50511 Malignant neoplasm of lower-outer quadrant of right female breast: ICD-10-CM

## 2017-02-10 DIAGNOSIS — C50411 Malignant neoplasm of upper-outer quadrant of right female breast: ICD-10-CM

## 2017-02-10 DIAGNOSIS — Z9013 Acquired absence of bilateral breasts and nipples: ICD-10-CM

## 2017-02-10 DIAGNOSIS — Z79818 Long term (current) use of other agents affecting estrogen receptors and estrogen levels: ICD-10-CM

## 2017-02-10 DIAGNOSIS — Z170 Estrogen receptor positive status [ER+]: ICD-10-CM

## 2017-02-10 MED ORDER — GOSERELIN 3.6 MG SC IMPL
3.6 mg | Freq: Once | SUBCUTANEOUS | 0 refills | Status: CP
Start: 2017-02-10 — End: ?
  Administered 2017-02-10: 20:00:00 3.6 mg via SUBCUTANEOUS

## 2017-02-10 MED ORDER — LIDOCAINE (PF) 10 MG/ML (1 %) IJ SOLN
5 mL | Freq: Once | SUBCUTANEOUS | 0 refills | Status: CP
Start: 2017-02-10 — End: ?
  Administered 2017-02-10: 20:00:00 5 mL via SUBCUTANEOUS

## 2017-02-10 NOTE — Progress Notes
Patient received goserelin (ZOLADEX) implant syringe 3.6 mg   and tolerated without difficulty.  No pertinent changes since last assessment.

## 2017-03-07 ENCOUNTER — Encounter: Admit: 2017-03-07 | Discharge: 2017-03-07 | Payer: BC Managed Care – PPO

## 2017-03-07 MED ORDER — ZOLPIDEM 10 MG PO TAB
ORAL_TABLET | Freq: Every day | ORAL | 1 refills | 30.00000 days | Status: AC | PRN
Start: 2017-03-07 — End: 2017-05-09

## 2017-03-10 ENCOUNTER — Encounter: Admit: 2017-03-10 | Discharge: 2017-03-10 | Payer: BC Managed Care – PPO

## 2017-03-10 DIAGNOSIS — C50411 Malignant neoplasm of upper-outer quadrant of right female breast: ICD-10-CM

## 2017-03-10 DIAGNOSIS — C50511 Malignant neoplasm of lower-outer quadrant of right female breast: ICD-10-CM

## 2017-03-10 DIAGNOSIS — Z79818 Long term (current) use of other agents affecting estrogen receptors and estrogen levels: ICD-10-CM

## 2017-03-10 DIAGNOSIS — Z9013 Acquired absence of bilateral breasts and nipples: ICD-10-CM

## 2017-03-10 DIAGNOSIS — Z5111 Encounter for antineoplastic chemotherapy: Principal | ICD-10-CM

## 2017-03-10 DIAGNOSIS — Z170 Estrogen receptor positive status [ER+]: ICD-10-CM

## 2017-03-10 DIAGNOSIS — C50811 Malignant neoplasm of overlapping sites of right female breast: ICD-10-CM

## 2017-03-10 MED ORDER — LIDOCAINE (PF) 10 MG/ML (1 %) IJ SOLN
2 mL | Freq: Once | SUBCUTANEOUS | 0 refills | Status: CP
Start: 2017-03-10 — End: ?
  Administered 2017-03-10: 17:00:00 2 mL via SUBCUTANEOUS

## 2017-03-10 MED ORDER — GOSERELIN 3.6 MG SC IMPL
3.6 mg | Freq: Once | SUBCUTANEOUS | 0 refills | Status: CP
Start: 2017-03-10 — End: ?
  Administered 2017-03-10: 17:00:00 3.6 mg via SUBCUTANEOUS

## 2017-03-10 NOTE — Progress Notes
Patient received zoladex and tolerated without difficulty.  No pertinent changes since last assessment.  Pt denies need for AVS, left ambulatory at 1116.

## 2017-04-03 ENCOUNTER — Encounter: Admit: 2017-04-03 | Discharge: 2017-04-03 | Payer: BC Managed Care – PPO

## 2017-04-04 ENCOUNTER — Encounter: Admit: 2017-04-04 | Discharge: 2017-04-04 | Payer: BC Managed Care – PPO

## 2017-04-06 ENCOUNTER — Encounter: Admit: 2017-04-06 | Discharge: 2017-04-06 | Payer: BC Managed Care – PPO

## 2017-04-06 DIAGNOSIS — C50411 Malignant neoplasm of upper-outer quadrant of right female breast: ICD-10-CM

## 2017-04-06 DIAGNOSIS — Z170 Estrogen receptor positive status [ER+]: ICD-10-CM

## 2017-04-06 DIAGNOSIS — Z79810 Long term (current) use of selective estrogen receptor modulators (SERMs): ICD-10-CM

## 2017-04-06 DIAGNOSIS — Z79818 Long term (current) use of other agents affecting estrogen receptors and estrogen levels: ICD-10-CM

## 2017-04-06 DIAGNOSIS — C50511 Malignant neoplasm of lower-outer quadrant of right female breast: ICD-10-CM

## 2017-04-06 DIAGNOSIS — C50811 Malignant neoplasm of overlapping sites of right female breast: Principal | ICD-10-CM

## 2017-04-06 DIAGNOSIS — Z9013 Acquired absence of bilateral breasts and nipples: ICD-10-CM

## 2017-04-06 MED ORDER — GOSERELIN 3.6 MG SC IMPL
3.6 mg | Freq: Once | SUBCUTANEOUS | 0 refills | Status: CP
Start: 2017-04-06 — End: ?
  Administered 2017-04-06: 23:00:00 3.6 mg via SUBCUTANEOUS

## 2017-04-06 MED ORDER — LIDOCAINE (PF) 10 MG/ML (1 %) IJ SOLN
2 mL | Freq: Once | SUBCUTANEOUS | 0 refills | Status: CP
Start: 2017-04-06 — End: ?
  Administered 2017-04-06: 23:00:00 2 mL via SUBCUTANEOUS

## 2017-05-01 ENCOUNTER — Encounter: Admit: 2017-05-01 | Discharge: 2017-05-01 | Payer: BC Managed Care – PPO

## 2017-05-01 ENCOUNTER — Ambulatory Visit: Admit: 2017-05-01 | Discharge: 2017-05-02 | Payer: BC Managed Care – PPO

## 2017-05-01 DIAGNOSIS — Z789 Other specified health status: ICD-10-CM

## 2017-05-01 DIAGNOSIS — R112 Nausea with vomiting, unspecified: ICD-10-CM

## 2017-05-01 DIAGNOSIS — K929 Disease of digestive system, unspecified: ICD-10-CM

## 2017-05-01 DIAGNOSIS — Z9221 Personal history of antineoplastic chemotherapy: ICD-10-CM

## 2017-05-01 DIAGNOSIS — C50911 Malignant neoplasm of unspecified site of right female breast: Principal | ICD-10-CM

## 2017-05-01 DIAGNOSIS — K219 Gastro-esophageal reflux disease without esophagitis: ICD-10-CM

## 2017-05-02 DIAGNOSIS — Z421 Encounter for breast reconstruction following mastectomy: Principal | ICD-10-CM

## 2017-05-04 ENCOUNTER — Encounter: Admit: 2017-05-04 | Discharge: 2017-05-04 | Payer: BC Managed Care – PPO

## 2017-05-04 DIAGNOSIS — R112 Nausea with vomiting, unspecified: ICD-10-CM

## 2017-05-04 DIAGNOSIS — Z170 Estrogen receptor positive status [ER+]: ICD-10-CM

## 2017-05-04 DIAGNOSIS — K219 Gastro-esophageal reflux disease without esophagitis: ICD-10-CM

## 2017-05-04 DIAGNOSIS — Z9221 Personal history of antineoplastic chemotherapy: ICD-10-CM

## 2017-05-04 DIAGNOSIS — Z789 Other specified health status: ICD-10-CM

## 2017-05-04 DIAGNOSIS — Z79810 Long term (current) use of selective estrogen receptor modulators (SERMs): ICD-10-CM

## 2017-05-04 DIAGNOSIS — C50911 Malignant neoplasm of unspecified site of right female breast: Principal | ICD-10-CM

## 2017-05-04 DIAGNOSIS — K929 Disease of digestive system, unspecified: ICD-10-CM

## 2017-05-04 DIAGNOSIS — C50411 Malignant neoplasm of upper-outer quadrant of right female breast: Principal | ICD-10-CM

## 2017-05-04 MED ORDER — GOSERELIN 3.6 MG SC IMPL
3.6 mg | Freq: Once | SUBCUTANEOUS | 0 refills | Status: CP
Start: 2017-05-04 — End: ?
  Administered 2017-05-04: 20:00:00 3.6 mg via SUBCUTANEOUS

## 2017-05-04 MED ORDER — LIDOCAINE (PF) 10 MG/ML (1 %) IJ SOLN
5 mL | Freq: Once | SUBCUTANEOUS | 0 refills | Status: CP
Start: 2017-05-04 — End: ?
  Administered 2017-05-04: 20:00:00 5 mL via SUBCUTANEOUS

## 2017-05-09 ENCOUNTER — Encounter: Admit: 2017-05-09 | Discharge: 2017-05-09 | Payer: BC Managed Care – PPO

## 2017-05-09 MED ORDER — ZOLPIDEM 10 MG PO TAB
ORAL_TABLET | Freq: Every day | 1 refills | Status: CN | PRN
Start: 2017-05-09 — End: ?

## 2017-05-09 MED ORDER — ZOLPIDEM 10 MG PO TAB
ORAL_TABLET | Freq: Every day | ORAL | 1 refills | 30.00000 days | Status: AC | PRN
Start: 2017-05-09 — End: 2017-06-30

## 2017-05-22 ENCOUNTER — Encounter: Admit: 2017-05-22 | Discharge: 2017-05-22 | Payer: BC Managed Care – PPO

## 2017-06-02 ENCOUNTER — Encounter: Admit: 2017-06-02 | Discharge: 2017-06-02 | Payer: BC Managed Care – PPO

## 2017-06-02 DIAGNOSIS — C50511 Malignant neoplasm of lower-outer quadrant of right female breast: ICD-10-CM

## 2017-06-02 DIAGNOSIS — Z79818 Long term (current) use of other agents affecting estrogen receptors and estrogen levels: ICD-10-CM

## 2017-06-02 DIAGNOSIS — C50411 Malignant neoplasm of upper-outer quadrant of right female breast: Secondary | ICD-10-CM

## 2017-06-02 DIAGNOSIS — C50811 Malignant neoplasm of overlapping sites of right female breast: ICD-10-CM

## 2017-06-02 DIAGNOSIS — Z79810 Long term (current) use of selective estrogen receptor modulators (SERMs): ICD-10-CM

## 2017-06-02 DIAGNOSIS — Z9013 Acquired absence of bilateral breasts and nipples: ICD-10-CM

## 2017-06-02 DIAGNOSIS — Z5111 Encounter for antineoplastic chemotherapy: Principal | ICD-10-CM

## 2017-06-02 DIAGNOSIS — Z170 Estrogen receptor positive status [ER+]: ICD-10-CM

## 2017-06-02 MED ORDER — LIDOCAINE (PF) 10 MG/ML (1 %) IJ SOLN
5 mL | Freq: Once | SUBCUTANEOUS | 0 refills | Status: CP
Start: 2017-06-02 — End: ?
  Administered 2017-06-02: 20:00:00 5 mL via SUBCUTANEOUS

## 2017-06-02 MED ORDER — GOSERELIN 3.6 MG SC IMPL
3.6 mg | Freq: Once | SUBCUTANEOUS | 0 refills | Status: CP
Start: 2017-06-02 — End: ?
  Administered 2017-06-02: 20:00:00 3.6 mg via SUBCUTANEOUS

## 2017-06-05 ENCOUNTER — Encounter: Admit: 2017-06-05 | Discharge: 2017-06-05 | Payer: BC Managed Care – PPO

## 2017-06-05 MED ORDER — GABAPENTIN 100 MG PO CAP
200 mg | ORAL_CAPSULE | ORAL | 3 refills | Status: AC
Start: 2017-06-05 — End: 2017-09-25

## 2017-06-26 ENCOUNTER — Ambulatory Visit: Admit: 2017-06-26 | Discharge: 2017-06-26 | Payer: BC Managed Care – PPO

## 2017-06-26 ENCOUNTER — Encounter: Admit: 2017-06-26 | Discharge: 2017-06-26 | Payer: BC Managed Care – PPO

## 2017-06-26 DIAGNOSIS — Z9221 Personal history of antineoplastic chemotherapy: ICD-10-CM

## 2017-06-26 DIAGNOSIS — C50911 Malignant neoplasm of unspecified site of right female breast: Principal | ICD-10-CM

## 2017-06-26 DIAGNOSIS — R112 Nausea with vomiting, unspecified: ICD-10-CM

## 2017-06-26 DIAGNOSIS — K929 Disease of digestive system, unspecified: ICD-10-CM

## 2017-06-26 DIAGNOSIS — K219 Gastro-esophageal reflux disease without esophagitis: ICD-10-CM

## 2017-06-26 DIAGNOSIS — R52 Pain, unspecified: ICD-10-CM

## 2017-06-26 DIAGNOSIS — Z789 Other specified health status: ICD-10-CM

## 2017-06-26 DIAGNOSIS — N650 Deformity of reconstructed breast: Principal | ICD-10-CM

## 2017-06-26 DIAGNOSIS — Z421 Encounter for breast reconstruction following mastectomy: ICD-10-CM

## 2017-06-29 ENCOUNTER — Encounter: Admit: 2017-06-29 | Discharge: 2017-06-29 | Payer: BC Managed Care – PPO

## 2017-06-29 DIAGNOSIS — C50911 Malignant neoplasm of unspecified site of right female breast: Principal | ICD-10-CM

## 2017-06-29 DIAGNOSIS — Z789 Other specified health status: ICD-10-CM

## 2017-06-29 DIAGNOSIS — K219 Gastro-esophageal reflux disease without esophagitis: ICD-10-CM

## 2017-06-29 DIAGNOSIS — K929 Disease of digestive system, unspecified: ICD-10-CM

## 2017-06-29 DIAGNOSIS — R112 Nausea with vomiting, unspecified: ICD-10-CM

## 2017-06-29 DIAGNOSIS — Z9221 Personal history of antineoplastic chemotherapy: ICD-10-CM

## 2017-06-30 ENCOUNTER — Encounter: Admit: 2017-06-30 | Discharge: 2017-06-30 | Payer: BC Managed Care – PPO

## 2017-06-30 DIAGNOSIS — Z79818 Long term (current) use of other agents affecting estrogen receptors and estrogen levels: ICD-10-CM

## 2017-06-30 DIAGNOSIS — Z5111 Encounter for antineoplastic chemotherapy: Principal | ICD-10-CM

## 2017-06-30 DIAGNOSIS — Z170 Estrogen receptor positive status [ER+]: ICD-10-CM

## 2017-06-30 DIAGNOSIS — C50411 Malignant neoplasm of upper-outer quadrant of right female breast: ICD-10-CM

## 2017-06-30 MED ORDER — LIDOCAINE (PF) 10 MG/ML (1 %) IJ SOLN
5 mL | Freq: Once | SUBCUTANEOUS | 0 refills | Status: CP
Start: 2017-06-30 — End: ?
  Administered 2017-06-30: 17:00:00 2 mL via SUBCUTANEOUS

## 2017-06-30 MED ORDER — GOSERELIN 3.6 MG SC IMPL
3.6 mg | Freq: Once | SUBCUTANEOUS | 0 refills | Status: CP
Start: 2017-06-30 — End: ?
  Administered 2017-06-30: 17:00:00 3.6 mg via SUBCUTANEOUS

## 2017-06-30 MED ORDER — VENLAFAXINE 37.5 MG PO CP24
37.5 mg | ORAL_CAPSULE | Freq: Every day | ORAL | 3 refills | Status: AC
Start: 2017-06-30 — End: 2018-08-14

## 2017-06-30 MED ORDER — VENLAFAXINE 75 MG PO CP24
75 mg | ORAL_CAPSULE | Freq: Every day | ORAL | 3 refills | Status: AC
Start: 2017-06-30 — End: 2018-06-11

## 2017-06-30 MED ORDER — ZOLPIDEM 10 MG PO TAB
ORAL_TABLET | Freq: Every day | ORAL | 1 refills | 30.00000 days | Status: AC | PRN
Start: 2017-06-30 — End: 2017-08-28

## 2017-06-30 MED ORDER — ZOLPIDEM 10 MG PO TAB
ORAL_TABLET | Freq: Every day | 1 refills | PRN
Start: 2017-06-30 — End: ?

## 2017-07-04 ENCOUNTER — Encounter: Admit: 2017-07-04 | Discharge: 2017-07-04 | Payer: BC Managed Care – PPO

## 2017-07-04 DIAGNOSIS — Z9882 Breast implant status: ICD-10-CM

## 2017-07-04 DIAGNOSIS — C50911 Malignant neoplasm of unspecified site of right female breast: Principal | ICD-10-CM

## 2017-07-06 ENCOUNTER — Encounter: Admit: 2017-07-06 | Discharge: 2017-07-06 | Payer: BC Managed Care – PPO

## 2017-07-13 ENCOUNTER — Encounter: Admit: 2017-07-13 | Discharge: 2017-07-13 | Payer: BC Managed Care – PPO

## 2017-07-14 ENCOUNTER — Encounter: Admit: 2017-07-14 | Discharge: 2017-07-14 | Payer: BC Managed Care – PPO

## 2017-07-14 DIAGNOSIS — Z789 Other specified health status: ICD-10-CM

## 2017-07-14 DIAGNOSIS — K929 Disease of digestive system, unspecified: ICD-10-CM

## 2017-07-14 DIAGNOSIS — R112 Nausea with vomiting, unspecified: ICD-10-CM

## 2017-07-14 DIAGNOSIS — K219 Gastro-esophageal reflux disease without esophagitis: ICD-10-CM

## 2017-07-14 DIAGNOSIS — Z9221 Personal history of antineoplastic chemotherapy: ICD-10-CM

## 2017-07-14 DIAGNOSIS — C50911 Malignant neoplasm of unspecified site of right female breast: Principal | ICD-10-CM

## 2017-07-24 ENCOUNTER — Encounter: Admit: 2017-07-24 | Discharge: 2017-07-24 | Payer: BC Managed Care – PPO

## 2017-07-24 MED ORDER — TAMOXIFEN 20 MG PO TAB
ORAL_TABLET | Freq: Every day | 1 refills | Status: AC
Start: 2017-07-24 — End: 2018-01-18

## 2017-07-25 MED ORDER — GABAPENTIN 300 MG PO CAP
900 mg | ORAL_CAPSULE | Freq: Every evening | ORAL | 2 refills | Status: AC
Start: 2017-07-25 — End: 2018-08-28

## 2017-07-26 ENCOUNTER — Ambulatory Visit: Admit: 2017-07-26 | Discharge: 2017-07-26 | Payer: BC Managed Care – PPO

## 2017-07-26 ENCOUNTER — Encounter: Admit: 2017-07-26 | Discharge: 2017-07-26 | Payer: BC Managed Care – PPO

## 2017-07-26 DIAGNOSIS — Z853 Personal history of malignant neoplasm of breast: ICD-10-CM

## 2017-07-26 DIAGNOSIS — R112 Nausea with vomiting, unspecified: ICD-10-CM

## 2017-07-26 DIAGNOSIS — N650 Deformity of reconstructed breast: Principal | ICD-10-CM

## 2017-07-26 DIAGNOSIS — K929 Disease of digestive system, unspecified: ICD-10-CM

## 2017-07-26 DIAGNOSIS — Z9013 Acquired absence of bilateral breasts and nipples: ICD-10-CM

## 2017-07-26 DIAGNOSIS — Z87891 Personal history of nicotine dependence: ICD-10-CM

## 2017-07-26 DIAGNOSIS — C50911 Malignant neoplasm of unspecified site of right female breast: Principal | ICD-10-CM

## 2017-07-26 DIAGNOSIS — Z9221 Personal history of antineoplastic chemotherapy: ICD-10-CM

## 2017-07-26 DIAGNOSIS — K219 Gastro-esophageal reflux disease without esophagitis: ICD-10-CM

## 2017-07-26 DIAGNOSIS — Z789 Other specified health status: ICD-10-CM

## 2017-07-26 MED ORDER — DIPHENHYDRAMINE HCL 50 MG/ML IJ SOLN
12.5 mg | Freq: Once | INTRAVENOUS | 0 refills | Status: DC | PRN
Start: 2017-07-26 — End: 2017-07-26

## 2017-07-26 MED ORDER — GENTAMICIN 40 MG/ML IJ SOLN
0 refills | Status: DC
Start: 2017-07-26 — End: 2017-07-26
  Administered 2017-07-26: 16:00:00 2 mL via TOPICAL

## 2017-07-26 MED ORDER — BACITRACIN 50,000 UN NS 1000 ML IRR BAG (OR)
0 refills | Status: DC
Start: 2017-07-26 — End: 2017-07-26
  Administered 2017-07-26 (×2): 1000 mL

## 2017-07-26 MED ORDER — OXYCODONE 5 MG PO TAB
5 mg | ORAL_TABLET | ORAL | 0 refills | 6.00000 days | Status: AC | PRN
Start: 2017-07-26 — End: 2017-07-26
  Filled 2017-07-26: qty 35, 9d supply, fill #1

## 2017-07-26 MED ORDER — ONDANSETRON 8 MG PO TBDI
8 mg | ORAL_TABLET | ORAL | 0 refills | 8.00000 days | Status: AC | PRN
Start: 2017-07-26 — End: 2017-07-26
  Filled 2017-07-26: qty 15, 5d supply, fill #1

## 2017-07-26 MED ORDER — DOXYCYCLINE HYCLATE 100 MG PO CAP
100 mg | ORAL_CAPSULE | Freq: Two times a day (BID) | ORAL | 0 refills | 8.00000 days | Status: AC
Start: 2017-07-26 — End: ?

## 2017-07-26 MED ORDER — DOXYCYCLINE HYCLATE 100 MG PO CAP
100 mg | ORAL_CAPSULE | Freq: Two times a day (BID) | ORAL | 0 refills | 8.00000 days | Status: AC
Start: 2017-07-26 — End: 2017-07-26

## 2017-07-26 MED ORDER — LACTATED RINGERS IV SOLP
INTRAVENOUS | 0 refills | Status: DC
Start: 2017-07-26 — End: 2017-07-26
  Administered 2017-07-26 (×2): 1000.000 mL via INTRAVENOUS

## 2017-07-26 MED ORDER — LIDOCAINE 1%-EPI 1:100,000 50 ML LR 1000 ML IRR (OR)
0 refills | Status: DC
Start: 2017-07-26 — End: 2017-07-26
  Administered 2017-07-26 (×6): 1000 mL

## 2017-07-26 MED ORDER — ACETAMINOPHEN 500 MG PO TAB
1000 mg | Freq: Once | ORAL | 0 refills | Status: CP
Start: 2017-07-26 — End: ?
  Administered 2017-07-26: 14:00:00 1000 mg via ORAL

## 2017-07-26 MED ORDER — OXYCODONE 5 MG PO TAB
5 mg | ORAL_TABLET | ORAL | 0 refills | 6.00000 days | Status: AC | PRN
Start: 2017-07-26 — End: 2017-11-03
  Filled 2017-07-26: qty 35, 9d supply, fill #1

## 2017-07-26 MED ORDER — CEFAZOLIN INJ 1GM IVP
2 g | Freq: Once | INTRAVENOUS | 0 refills | Status: CP
Start: 2017-07-26 — End: ?
  Administered 2017-07-26: 16:00:00 2 g via INTRAVENOUS

## 2017-07-26 MED ORDER — LIDOCAINE (PF) 10 MG/ML (1 %) IJ SOLN
.1-2 mL | INTRAMUSCULAR | 0 refills | Status: DC | PRN
Start: 2017-07-26 — End: 2017-07-26

## 2017-07-26 MED ORDER — METOCLOPRAMIDE HCL 5 MG/ML IJ SOLN
10 mg | Freq: Once | INTRAVENOUS | 0 refills | Status: DC | PRN
Start: 2017-07-26 — End: 2017-07-26

## 2017-07-26 MED ORDER — OXYCODONE 5 MG PO TAB
5 mg | ORAL_TABLET | ORAL | 0 refills | 6.00000 days | Status: AC | PRN
Start: 2017-07-26 — End: 2017-07-26

## 2017-07-26 MED ORDER — OXYCODONE 5 MG PO TAB
5-10 mg | Freq: Once | ORAL | 0 refills | Status: DC | PRN
Start: 2017-07-26 — End: 2017-07-26

## 2017-07-26 MED ORDER — FENTANYL CITRATE (PF) 50 MCG/ML IJ SOLN
25 ug | INTRAVENOUS | 0 refills | Status: DC | PRN
Start: 2017-07-26 — End: 2017-07-26

## 2017-07-26 MED ORDER — HYDROMORPHONE (PF) 2 MG/ML IJ SYRG
.5 mg | INTRAVENOUS | 0 refills | Status: DC | PRN
Start: 2017-07-26 — End: 2017-07-26

## 2017-07-26 MED ORDER — FAMOTIDINE (PF) 20 MG/2 ML IV SOLN
0 refills | Status: DC
Start: 2017-07-26 — End: 2017-07-26
  Administered 2017-07-26: 16:00:00 20 mg via INTRAVENOUS

## 2017-07-26 MED ORDER — DEXAMETHASONE SODIUM PHOSPHATE 4 MG/ML IJ SOLN
INTRAVENOUS | 0 refills | Status: DC
Start: 2017-07-26 — End: 2017-07-26
  Administered 2017-07-26: 16:00:00 4 mg via INTRAVENOUS

## 2017-07-26 MED ORDER — MIDAZOLAM 1 MG/ML IJ SOLN
INTRAVENOUS | 0 refills | Status: DC
Start: 2017-07-26 — End: 2017-07-26
  Administered 2017-07-26: 15:00:00 2 mg via INTRAVENOUS

## 2017-07-26 MED ORDER — DOXYCYCLINE HYCLATE 100 MG PO CAP
100 mg | ORAL_CAPSULE | Freq: Two times a day (BID) | ORAL | 0 refills | 8.00000 days | Status: AC
Start: 2017-07-26 — End: 2017-07-26
  Filled 2017-07-26 (×2): qty 20, 10d supply, fill #1

## 2017-07-26 MED ORDER — PROPOFOL INJ 10 MG/ML IV VIAL
0 refills | Status: DC
Start: 2017-07-26 — End: 2017-07-26
  Administered 2017-07-26: 16:00:00 200 mg via INTRAVENOUS

## 2017-07-26 MED ORDER — HALOPERIDOL LACTATE 5 MG/ML IJ SOLN
1 mg | Freq: Once | INTRAVENOUS | 0 refills | Status: DC | PRN
Start: 2017-07-26 — End: 2017-07-26

## 2017-07-26 MED ORDER — ONDANSETRON 8 MG PO TBDI
8 mg | ORAL_TABLET | ORAL | 0 refills | 8.00000 days | Status: AC | PRN
Start: 2017-07-26 — End: 2017-11-02

## 2017-07-26 MED ORDER — FENTANYL CITRATE (PF) 50 MCG/ML IJ SOLN
50 ug | INTRAVENOUS | 0 refills | Status: DC | PRN
Start: 2017-07-26 — End: 2017-07-26
  Administered 2017-07-26 (×2): 50 ug via INTRAVENOUS

## 2017-07-26 MED ORDER — BUPIVACAINE-EPINEPHRINE 0.5 %-1:200,000 IJ SOLN
0 refills | Status: DC
Start: 2017-07-26 — End: 2017-07-26
  Administered 2017-07-26: 16:00:00 7 mL via INTRAMUSCULAR

## 2017-07-26 MED ORDER — FENTANYL CITRATE (PF) 50 MCG/ML IJ SOLN
0 refills | Status: DC
Start: 2017-07-26 — End: 2017-07-26
  Administered 2017-07-26: 16:00:00 50 ug via INTRAVENOUS
  Administered 2017-07-26 (×2): 100 ug via INTRAVENOUS
  Administered 2017-07-26: 17:00:00 50 ug via INTRAVENOUS

## 2017-07-26 MED ORDER — ONDANSETRON HCL (PF) 4 MG/2 ML IJ SOLN
INTRAVENOUS | 0 refills | Status: DC
Start: 2017-07-26 — End: 2017-07-26
  Administered 2017-07-26: 17:00:00 4 mg via INTRAVENOUS

## 2017-07-26 MED ORDER — PROPOFOL 10 MG/ML IV EMUL (INFUSION)(AM)(OR)
0 refills | Status: DC
Start: 2017-07-26 — End: 2017-07-26
  Administered 2017-07-26: 16:00:00 150 ug/kg/min via INTRAVENOUS

## 2017-07-26 MED ORDER — LIDOCAINE (PF) 200 MG/10 ML (2 %) IJ SYRG
0 refills | Status: DC
Start: 2017-07-26 — End: 2017-07-26
  Administered 2017-07-26: 16:00:00 100 mg via INTRAVENOUS

## 2017-07-26 MED ADMIN — LIDOCAINE (PF) 10 MG/ML (1 %) IJ SOLN [95838]: 0.2 mL | INTRAMUSCULAR | @ 14:00:00 | Stop: 2017-07-26 | NDC 63323049227

## 2017-07-27 ENCOUNTER — Encounter: Admit: 2017-07-27 | Discharge: 2017-07-27 | Payer: BC Managed Care – PPO

## 2017-07-28 ENCOUNTER — Encounter: Admit: 2017-07-28 | Discharge: 2017-07-28 | Payer: BC Managed Care – PPO

## 2017-07-28 DIAGNOSIS — K929 Disease of digestive system, unspecified: ICD-10-CM

## 2017-07-28 DIAGNOSIS — Z789 Other specified health status: ICD-10-CM

## 2017-07-28 DIAGNOSIS — R112 Nausea with vomiting, unspecified: ICD-10-CM

## 2017-07-28 DIAGNOSIS — Z9221 Personal history of antineoplastic chemotherapy: ICD-10-CM

## 2017-07-28 DIAGNOSIS — C50911 Malignant neoplasm of unspecified site of right female breast: Principal | ICD-10-CM

## 2017-07-28 DIAGNOSIS — K219 Gastro-esophageal reflux disease without esophagitis: ICD-10-CM

## 2017-08-01 ENCOUNTER — Encounter: Admit: 2017-08-01 | Discharge: 2017-08-01 | Payer: BC Managed Care – PPO

## 2017-08-01 ENCOUNTER — Emergency Department
Admit: 2017-08-01 | Discharge: 2017-08-02 | Disposition: A | Discharge status: Home / Self Care | Payer: BC Managed Care – PPO

## 2017-08-01 ENCOUNTER — Emergency Department: Admit: 2017-08-01 | Discharge: 2017-08-01 | Payer: BC Managed Care – PPO

## 2017-08-01 DIAGNOSIS — Z9221 Personal history of antineoplastic chemotherapy: ICD-10-CM

## 2017-08-01 DIAGNOSIS — R112 Nausea with vomiting, unspecified: ICD-10-CM

## 2017-08-01 DIAGNOSIS — C50911 Malignant neoplasm of unspecified site of right female breast: Principal | ICD-10-CM

## 2017-08-01 DIAGNOSIS — K929 Disease of digestive system, unspecified: ICD-10-CM

## 2017-08-01 DIAGNOSIS — K219 Gastro-esophageal reflux disease without esophagitis: ICD-10-CM

## 2017-08-01 DIAGNOSIS — Z789 Other specified health status: ICD-10-CM

## 2017-08-01 LAB — POC TROPONIN: Lab: 0 ng/mL (ref 0.00–0.05)

## 2017-08-01 MED ORDER — SODIUM CHLORIDE 0.9 % IJ SOLN
50 mL | Freq: Once | INTRAVENOUS | 0 refills | Status: CP
Start: 2017-08-01 — End: ?
  Administered 2017-08-02: 03:00:00 50 mL via INTRAVENOUS

## 2017-08-01 MED ORDER — KETOROLAC 15 MG/ML IJ SOLN
15 mg | Freq: Once | INTRAVENOUS | 0 refills | Status: DC
Start: 2017-08-01 — End: 2017-08-02

## 2017-08-01 MED ORDER — ACETAMINOPHEN/LIDOCAINE/ANTACID DS(#) 1:1:3  PO SUSP
30 mL | Freq: Once | ORAL | 0 refills | Status: CP
Start: 2017-08-01 — End: ?
  Administered 2017-08-02: 04:00:00 30 mL via ORAL

## 2017-08-01 MED ORDER — IOHEXOL 350 MG IODINE/ML IV SOLN
70 mL | Freq: Once | INTRAVENOUS | 0 refills | Status: CP
Start: 2017-08-01 — End: ?
  Administered 2017-08-02: 03:00:00 70 mL via INTRAVENOUS

## 2017-08-01 MED ORDER — KETOROLAC 15 MG/ML IJ SOLN
15 mg | Freq: Once | INTRAVENOUS | 0 refills | Status: CP
Start: 2017-08-01 — End: ?
  Administered 2017-08-02: 04:00:00 15 mg via INTRAVENOUS

## 2017-08-01 MED ORDER — SODIUM CHLORIDE 0.9 % IV SOLP
1000 mL | INTRAVENOUS | 0 refills | Status: CP
Start: 2017-08-01 — End: ?
  Administered 2017-08-02: 01:00:00 1000 mL via INTRAVENOUS

## 2017-08-02 ENCOUNTER — Encounter: Admit: 2017-08-02 | Discharge: 2017-08-02 | Payer: BC Managed Care – PPO

## 2017-08-02 DIAGNOSIS — R079 Chest pain, unspecified: Principal | ICD-10-CM

## 2017-08-02 DIAGNOSIS — Z853 Personal history of malignant neoplasm of breast: ICD-10-CM

## 2017-08-02 DIAGNOSIS — Z87891 Personal history of nicotine dependence: ICD-10-CM

## 2017-08-02 LAB — TSH WITH FREE T4 REFLEX: Lab: 1.7 uU/mL (ref 0.35–5.00)

## 2017-08-02 LAB — COMPREHENSIVE METABOLIC PANEL
Lab: 0.2 mg/dL — ABNORMAL LOW (ref 0.3–1.2)
Lab: 0.7 mg/dL — ABNORMAL LOW (ref 0.4–1.00)
Lab: 10 mg/dL — ABNORMAL HIGH (ref 7–25)
Lab: 11 U/L (ref 7–56)
Lab: 128 mg/dL — ABNORMAL HIGH (ref 70–100)
Lab: 138 MMOL/L — ABNORMAL LOW (ref 137–147)
Lab: 26 MMOL/L (ref 21–30)
Lab: 3.9 g/dL (ref 3.5–5.0)
Lab: 6 10*3/uL (ref 3–12)
Lab: 7 g/dL (ref 6.0–8.0)
Lab: 83 U/L (ref 25–110)
Lab: 9 mg/dL (ref 8.5–10.6)
Lab: >60 mL/min (ref >60)
Lab: >60 mL/min (ref >60)

## 2017-08-02 LAB — CBC AND DIFF
Lab: 0 10*3/uL (ref 0–0.20)
Lab: 0.2 10*3/uL (ref 0–0.45)
Lab: 7.1 10*3/uL — ABNORMAL HIGH (ref 4.5–11.0)

## 2017-08-02 LAB — D-DIMER: Lab: 544 ng{FEU}/mL — ABNORMAL HIGH (ref <500)

## 2017-08-02 LAB — POC TROPONIN: Lab: 0 ng/mL (ref 0.00–0.05)

## 2017-08-02 LAB — POC LACTATE: Lab: 1.4 MMOL/L (ref 0.5–2.0)

## 2017-08-03 ENCOUNTER — Encounter: Admit: 2017-08-03 | Discharge: 2017-08-03 | Payer: BC Managed Care – PPO

## 2017-08-04 ENCOUNTER — Encounter: Admit: 2017-08-04 | Discharge: 2017-08-04 | Payer: BC Managed Care – PPO

## 2017-08-04 ENCOUNTER — Ambulatory Visit: Admit: 2017-08-04 | Discharge: 2017-08-05 | Payer: BC Managed Care – PPO

## 2017-08-04 DIAGNOSIS — C50911 Malignant neoplasm of unspecified site of right female breast: Principal | ICD-10-CM

## 2017-08-04 DIAGNOSIS — K929 Disease of digestive system, unspecified: ICD-10-CM

## 2017-08-04 DIAGNOSIS — Z5111 Encounter for antineoplastic chemotherapy: Principal | ICD-10-CM

## 2017-08-04 DIAGNOSIS — K219 Gastro-esophageal reflux disease without esophagitis: ICD-10-CM

## 2017-08-04 DIAGNOSIS — Z170 Estrogen receptor positive status [ER+]: ICD-10-CM

## 2017-08-04 DIAGNOSIS — Z421 Encounter for breast reconstruction following mastectomy: Principal | ICD-10-CM

## 2017-08-04 DIAGNOSIS — Z9221 Personal history of antineoplastic chemotherapy: ICD-10-CM

## 2017-08-04 DIAGNOSIS — R112 Nausea with vomiting, unspecified: ICD-10-CM

## 2017-08-04 DIAGNOSIS — Z789 Other specified health status: ICD-10-CM

## 2017-08-04 DIAGNOSIS — C50411 Malignant neoplasm of upper-outer quadrant of right female breast: ICD-10-CM

## 2017-08-04 MED ORDER — GOSERELIN 3.6 MG SC IMPL
3.6 mg | Freq: Once | SUBCUTANEOUS | 0 refills | Status: CP
Start: 2017-08-04 — End: ?
  Administered 2017-08-04: 18:00:00 3.6 mg via SUBCUTANEOUS

## 2017-08-04 MED ORDER — LIDOCAINE (PF) 10 MG/ML (1 %) IJ SOLN
5 mL | Freq: Once | SUBCUTANEOUS | 0 refills | Status: CP
Start: 2017-08-04 — End: ?
  Administered 2017-08-04: 18:00:00 5 mL via SUBCUTANEOUS

## 2017-08-25 ENCOUNTER — Encounter: Admit: 2017-08-25 | Discharge: 2017-08-25 | Payer: BC Managed Care – PPO

## 2017-08-28 ENCOUNTER — Encounter: Admit: 2017-08-28 | Discharge: 2017-08-28 | Payer: BC Managed Care – PPO

## 2017-08-28 MED ORDER — ZOLPIDEM 10 MG PO TAB
ORAL_TABLET | Freq: Every day | ORAL | 1 refills | 30.00000 days | Status: AC | PRN
Start: 2017-08-28 — End: 2017-11-15

## 2017-09-01 ENCOUNTER — Encounter: Admit: 2017-09-01 | Discharge: 2017-09-01 | Payer: BC Managed Care – PPO

## 2017-09-01 DIAGNOSIS — C50411 Malignant neoplasm of upper-outer quadrant of right female breast: ICD-10-CM

## 2017-09-01 DIAGNOSIS — C50511 Malignant neoplasm of lower-outer quadrant of right female breast: ICD-10-CM

## 2017-09-01 DIAGNOSIS — C50811 Malignant neoplasm of overlapping sites of right female breast: Principal | ICD-10-CM

## 2017-09-01 DIAGNOSIS — Z79818 Long term (current) use of other agents affecting estrogen receptors and estrogen levels: ICD-10-CM

## 2017-09-01 DIAGNOSIS — Z79810 Long term (current) use of selective estrogen receptor modulators (SERMs): ICD-10-CM

## 2017-09-01 DIAGNOSIS — Z170 Estrogen receptor positive status [ER+]: ICD-10-CM

## 2017-09-01 MED ORDER — LIDOCAINE (PF) 10 MG/ML (1 %) IJ SOLN
5 mL | Freq: Once | SUBCUTANEOUS | 0 refills | Status: CP
Start: 2017-09-01 — End: ?
  Administered 2017-09-01: 20:00:00 2 mL via SUBCUTANEOUS

## 2017-09-01 MED ORDER — GOSERELIN 3.6 MG SC IMPL
3.6 mg | Freq: Once | SUBCUTANEOUS | 0 refills | Status: CP
Start: 2017-09-01 — End: ?
  Administered 2017-09-01: 20:00:00 3.6 mg via SUBCUTANEOUS

## 2017-09-15 ENCOUNTER — Encounter: Admit: 2017-09-15 | Discharge: 2017-09-15 | Payer: BC Managed Care – PPO

## 2017-09-25 ENCOUNTER — Encounter: Admit: 2017-09-25 | Discharge: 2017-09-25 | Payer: BC Managed Care – PPO

## 2017-09-25 MED ORDER — GABAPENTIN 100 MG PO CAP
200 mg | ORAL_CAPSULE | ORAL | 3 refills | Status: AC
Start: 2017-09-25 — End: 2018-10-17

## 2017-10-11 ENCOUNTER — Encounter: Admit: 2017-10-11 | Discharge: 2017-10-11 | Payer: BC Managed Care – PPO

## 2017-10-12 ENCOUNTER — Encounter: Admit: 2017-10-12 | Discharge: 2017-10-12 | Payer: BC Managed Care – PPO

## 2017-10-12 DIAGNOSIS — Z79818 Long term (current) use of other agents affecting estrogen receptors and estrogen levels: ICD-10-CM

## 2017-10-12 DIAGNOSIS — Z170 Estrogen receptor positive status [ER+]: ICD-10-CM

## 2017-10-12 DIAGNOSIS — Z5111 Encounter for antineoplastic chemotherapy: Principal | ICD-10-CM

## 2017-10-12 DIAGNOSIS — C50511 Malignant neoplasm of lower-outer quadrant of right female breast: ICD-10-CM

## 2017-10-12 DIAGNOSIS — C50411 Malignant neoplasm of upper-outer quadrant of right female breast: ICD-10-CM

## 2017-10-12 DIAGNOSIS — Z79810 Long term (current) use of selective estrogen receptor modulators (SERMs): ICD-10-CM

## 2017-10-12 DIAGNOSIS — C50811 Malignant neoplasm of overlapping sites of right female breast: ICD-10-CM

## 2017-10-12 DIAGNOSIS — Z9013 Acquired absence of bilateral breasts and nipples: ICD-10-CM

## 2017-10-12 MED ORDER — LIDOCAINE (PF) 10 MG/ML (1 %) IJ SOLN
5 mL | Freq: Once | SUBCUTANEOUS | 0 refills | Status: CP
Start: 2017-10-12 — End: ?
  Administered 2017-10-12: 22:00:00 2 mL via SUBCUTANEOUS

## 2017-10-12 MED ORDER — GOSERELIN 3.6 MG SC IMPL
3.6 mg | Freq: Once | SUBCUTANEOUS | 0 refills | Status: CP
Start: 2017-10-12 — End: ?
  Administered 2017-10-12: 22:00:00 3.6 mg via SUBCUTANEOUS

## 2017-11-02 ENCOUNTER — Encounter: Admit: 2017-11-02 | Discharge: 2017-11-02 | Payer: BC Managed Care – PPO

## 2017-11-02 ENCOUNTER — Ambulatory Visit: Admit: 2017-11-02 | Discharge: 2017-11-02 | Payer: BC Managed Care – PPO

## 2017-11-02 DIAGNOSIS — C50911 Malignant neoplasm of unspecified site of right female breast: Principal | ICD-10-CM

## 2017-11-02 DIAGNOSIS — C50411 Malignant neoplasm of upper-outer quadrant of right female breast: Principal | ICD-10-CM

## 2017-11-02 DIAGNOSIS — Z79810 Long term (current) use of selective estrogen receptor modulators (SERMs): ICD-10-CM

## 2017-11-02 DIAGNOSIS — Z789 Other specified health status: ICD-10-CM

## 2017-11-02 DIAGNOSIS — R112 Nausea with vomiting, unspecified: ICD-10-CM

## 2017-11-02 DIAGNOSIS — K929 Disease of digestive system, unspecified: ICD-10-CM

## 2017-11-02 DIAGNOSIS — Z9221 Personal history of antineoplastic chemotherapy: ICD-10-CM

## 2017-11-02 DIAGNOSIS — Z170 Estrogen receptor positive status [ER+]: ICD-10-CM

## 2017-11-02 DIAGNOSIS — N951 Menopausal and female climacteric states: ICD-10-CM

## 2017-11-02 DIAGNOSIS — K219 Gastro-esophageal reflux disease without esophagitis: ICD-10-CM

## 2017-11-03 ENCOUNTER — Ambulatory Visit: Admit: 2017-11-03 | Discharge: 2017-11-04 | Payer: BC Managed Care – PPO

## 2017-11-03 ENCOUNTER — Encounter: Admit: 2017-11-03 | Discharge: 2017-11-03 | Payer: BC Managed Care – PPO

## 2017-11-03 DIAGNOSIS — Z9221 Personal history of antineoplastic chemotherapy: ICD-10-CM

## 2017-11-03 DIAGNOSIS — R112 Nausea with vomiting, unspecified: ICD-10-CM

## 2017-11-03 DIAGNOSIS — K219 Gastro-esophageal reflux disease without esophagitis: ICD-10-CM

## 2017-11-03 DIAGNOSIS — C50911 Malignant neoplasm of unspecified site of right female breast: Principal | ICD-10-CM

## 2017-11-03 DIAGNOSIS — Z789 Other specified health status: ICD-10-CM

## 2017-11-03 DIAGNOSIS — Z421 Encounter for breast reconstruction following mastectomy: Principal | ICD-10-CM

## 2017-11-03 DIAGNOSIS — K929 Disease of digestive system, unspecified: ICD-10-CM

## 2017-11-06 ENCOUNTER — Encounter: Admit: 2017-11-06 | Discharge: 2017-11-06 | Payer: BC Managed Care – PPO

## 2017-11-10 ENCOUNTER — Encounter: Admit: 2017-11-10 | Discharge: 2017-11-10 | Payer: BC Managed Care – PPO

## 2017-11-10 DIAGNOSIS — Z79810 Long term (current) use of selective estrogen receptor modulators (SERMs): ICD-10-CM

## 2017-11-10 DIAGNOSIS — Z79818 Long term (current) use of other agents affecting estrogen receptors and estrogen levels: ICD-10-CM

## 2017-11-10 DIAGNOSIS — C50511 Malignant neoplasm of lower-outer quadrant of right female breast: ICD-10-CM

## 2017-11-10 DIAGNOSIS — C50411 Malignant neoplasm of upper-outer quadrant of right female breast: ICD-10-CM

## 2017-11-10 DIAGNOSIS — Z170 Estrogen receptor positive status [ER+]: ICD-10-CM

## 2017-11-10 DIAGNOSIS — Z9013 Acquired absence of bilateral breasts and nipples: ICD-10-CM

## 2017-11-10 DIAGNOSIS — C50811 Malignant neoplasm of overlapping sites of right female breast: Principal | ICD-10-CM

## 2017-11-10 MED ORDER — GOSERELIN 3.6 MG SC IMPL
3.6 mg | Freq: Once | SUBCUTANEOUS | 0 refills | Status: CP
Start: 2017-11-10 — End: ?
  Administered 2017-11-10: 14:00:00 3.6 mg via SUBCUTANEOUS

## 2017-11-10 MED ORDER — LIDOCAINE (PF) 10 MG/ML (1 %) IJ SOLN
5 mL | Freq: Once | SUBCUTANEOUS | 0 refills | Status: CP
Start: 2017-11-10 — End: ?
  Administered 2017-11-10: 14:00:00 5 mL via SUBCUTANEOUS

## 2017-11-15 ENCOUNTER — Encounter: Admit: 2017-11-15 | Discharge: 2017-11-15 | Payer: BC Managed Care – PPO

## 2017-11-15 MED ORDER — ZOLPIDEM 10 MG PO TAB
ORAL_TABLET | Freq: Every day | ORAL | 2 refills | 30.00000 days | Status: AC | PRN
Start: 2017-11-15 — End: 2018-02-12

## 2017-11-19 ENCOUNTER — Encounter: Admit: 2017-11-19 | Discharge: 2017-11-19 | Payer: BC Managed Care – PPO

## 2017-11-20 ENCOUNTER — Ambulatory Visit: Admit: 2017-11-20 | Discharge: 2017-11-21 | Payer: BC Managed Care – PPO

## 2017-11-20 ENCOUNTER — Encounter: Admit: 2017-11-20 | Discharge: 2017-11-20 | Payer: BC Managed Care – PPO

## 2017-11-20 DIAGNOSIS — F419 Anxiety disorder, unspecified: ICD-10-CM

## 2017-11-20 DIAGNOSIS — Z9221 Personal history of antineoplastic chemotherapy: ICD-10-CM

## 2017-11-20 DIAGNOSIS — K929 Disease of digestive system, unspecified: ICD-10-CM

## 2017-11-20 DIAGNOSIS — Z789 Other specified health status: ICD-10-CM

## 2017-11-20 DIAGNOSIS — R112 Nausea with vomiting, unspecified: ICD-10-CM

## 2017-11-20 DIAGNOSIS — C50911 Malignant neoplasm of unspecified site of right female breast: Principal | ICD-10-CM

## 2017-11-20 DIAGNOSIS — M47816 Spondylosis without myelopathy or radiculopathy, lumbar region: Principal | ICD-10-CM

## 2017-11-20 DIAGNOSIS — K219 Gastro-esophageal reflux disease without esophagitis: ICD-10-CM

## 2017-12-08 ENCOUNTER — Encounter: Admit: 2017-12-08 | Discharge: 2017-12-08 | Payer: BC Managed Care – PPO

## 2017-12-08 DIAGNOSIS — Z170 Estrogen receptor positive status [ER+]: ICD-10-CM

## 2017-12-08 DIAGNOSIS — Z789 Other specified health status: ICD-10-CM

## 2017-12-08 DIAGNOSIS — C50511 Malignant neoplasm of lower-outer quadrant of right female breast: ICD-10-CM

## 2017-12-08 DIAGNOSIS — C50411 Malignant neoplasm of upper-outer quadrant of right female breast: ICD-10-CM

## 2017-12-08 DIAGNOSIS — Z79810 Long term (current) use of selective estrogen receptor modulators (SERMs): ICD-10-CM

## 2017-12-08 DIAGNOSIS — C50811 Malignant neoplasm of overlapping sites of right female breast: ICD-10-CM

## 2017-12-08 DIAGNOSIS — K219 Gastro-esophageal reflux disease without esophagitis: ICD-10-CM

## 2017-12-08 DIAGNOSIS — Z9221 Personal history of antineoplastic chemotherapy: ICD-10-CM

## 2017-12-08 DIAGNOSIS — C50911 Malignant neoplasm of unspecified site of right female breast: Principal | ICD-10-CM

## 2017-12-08 DIAGNOSIS — Z5111 Encounter for antineoplastic chemotherapy: Principal | ICD-10-CM

## 2017-12-08 DIAGNOSIS — Z9013 Acquired absence of bilateral breasts and nipples: ICD-10-CM

## 2017-12-08 DIAGNOSIS — F419 Anxiety disorder, unspecified: ICD-10-CM

## 2017-12-08 DIAGNOSIS — Z79818 Long term (current) use of other agents affecting estrogen receptors and estrogen levels: ICD-10-CM

## 2017-12-08 DIAGNOSIS — K929 Disease of digestive system, unspecified: ICD-10-CM

## 2017-12-08 DIAGNOSIS — R112 Nausea with vomiting, unspecified: ICD-10-CM

## 2017-12-08 MED ORDER — GOSERELIN 3.6 MG SC IMPL
3.6 mg | Freq: Once | SUBCUTANEOUS | 0 refills | Status: CP
Start: 2017-12-08 — End: ?
  Administered 2017-12-08: 16:00:00 3.6 mg via SUBCUTANEOUS

## 2017-12-08 MED ORDER — LIDOCAINE (PF) 10 MG/ML (1 %) IJ SOLN
5 mL | Freq: Once | SUBCUTANEOUS | 0 refills | Status: CP
Start: 2017-12-08 — End: ?
  Administered 2017-12-08: 16:00:00 5 mL via SUBCUTANEOUS

## 2017-12-11 ENCOUNTER — Encounter: Admit: 2017-12-11 | Discharge: 2017-12-11 | Payer: BC Managed Care – PPO

## 2017-12-15 ENCOUNTER — Encounter: Admit: 2017-12-15 | Discharge: 2017-12-15 | Payer: BC Managed Care – PPO

## 2018-01-05 ENCOUNTER — Encounter: Admit: 2018-01-05 | Discharge: 2018-01-05 | Payer: BC Managed Care – PPO

## 2018-01-05 DIAGNOSIS — C50411 Malignant neoplasm of upper-outer quadrant of right female breast: ICD-10-CM

## 2018-01-05 DIAGNOSIS — Z170 Estrogen receptor positive status [ER+]: ICD-10-CM

## 2018-01-05 DIAGNOSIS — Z9013 Acquired absence of bilateral breasts and nipples: ICD-10-CM

## 2018-01-05 DIAGNOSIS — Z79818 Long term (current) use of other agents affecting estrogen receptors and estrogen levels: ICD-10-CM

## 2018-01-05 DIAGNOSIS — C50811 Malignant neoplasm of overlapping sites of right female breast: Principal | ICD-10-CM

## 2018-01-05 DIAGNOSIS — C50511 Malignant neoplasm of lower-outer quadrant of right female breast: ICD-10-CM

## 2018-01-05 DIAGNOSIS — Z79810 Long term (current) use of selective estrogen receptor modulators (SERMs): ICD-10-CM

## 2018-01-05 MED ORDER — LIDOCAINE (PF) 10 MG/ML (1 %) IJ SOLN
5 mL | Freq: Once | SUBCUTANEOUS | 0 refills | Status: CP
Start: 2018-01-05 — End: ?
  Administered 2018-01-05: 16:00:00 5 mL via SUBCUTANEOUS

## 2018-01-05 MED ORDER — GOSERELIN 3.6 MG SC IMPL
3.6 mg | Freq: Once | SUBCUTANEOUS | 0 refills | Status: CP
Start: 2018-01-05 — End: ?
  Administered 2018-01-05: 16:00:00 3.6 mg via SUBCUTANEOUS

## 2018-01-18 ENCOUNTER — Encounter: Admit: 2018-01-18 | Discharge: 2018-01-18 | Payer: BC Managed Care – PPO

## 2018-01-18 MED ORDER — TAMOXIFEN 20 MG PO TAB
ORAL_TABLET | Freq: Every day | 3 refills | Status: AC
Start: 2018-01-18 — End: 2019-01-09

## 2018-02-02 ENCOUNTER — Encounter: Admit: 2018-02-02 | Discharge: 2018-02-02 | Payer: BC Managed Care – PPO

## 2018-02-02 DIAGNOSIS — Z5111 Encounter for antineoplastic chemotherapy: Principal | ICD-10-CM

## 2018-02-02 DIAGNOSIS — Z170 Estrogen receptor positive status [ER+]: ICD-10-CM

## 2018-02-02 DIAGNOSIS — Z79810 Long term (current) use of selective estrogen receptor modulators (SERMs): ICD-10-CM

## 2018-02-02 DIAGNOSIS — Z9013 Acquired absence of bilateral breasts and nipples: ICD-10-CM

## 2018-02-02 DIAGNOSIS — Z79818 Long term (current) use of other agents affecting estrogen receptors and estrogen levels: ICD-10-CM

## 2018-02-02 DIAGNOSIS — C50811 Malignant neoplasm of overlapping sites of right female breast: ICD-10-CM

## 2018-02-02 DIAGNOSIS — C50411 Malignant neoplasm of upper-outer quadrant of right female breast: ICD-10-CM

## 2018-02-02 DIAGNOSIS — C50511 Malignant neoplasm of lower-outer quadrant of right female breast: ICD-10-CM

## 2018-02-02 MED ORDER — LIDOCAINE (PF) 10 MG/ML (1 %) IJ SOLN
5 mL | Freq: Once | SUBCUTANEOUS | 0 refills | Status: CP
Start: 2018-02-02 — End: ?
  Administered 2018-02-02: 17:00:00 5 mL via SUBCUTANEOUS

## 2018-02-02 MED ORDER — GOSERELIN 3.6 MG SC IMPL
3.6 mg | Freq: Once | SUBCUTANEOUS | 0 refills | Status: CP
Start: 2018-02-02 — End: ?
  Administered 2018-02-02: 17:00:00 3.6 mg via SUBCUTANEOUS

## 2018-02-05 ENCOUNTER — Encounter: Admit: 2018-02-05 | Discharge: 2018-02-05 | Payer: BC Managed Care – PPO

## 2018-02-12 ENCOUNTER — Encounter: Admit: 2018-02-12 | Discharge: 2018-02-12 | Payer: BC Managed Care – PPO

## 2018-02-12 MED ORDER — ZOLPIDEM 10 MG PO TAB
ORAL_TABLET | Freq: Every evening | ORAL | 2 refills | 30.00000 days | Status: AC | PRN
Start: 2018-02-12 — End: 2018-05-14

## 2018-02-13 ENCOUNTER — Encounter: Admit: 2018-02-13 | Discharge: 2018-02-13 | Payer: BC Managed Care – PPO

## 2018-02-20 ENCOUNTER — Ambulatory Visit: Admit: 2018-02-20 | Discharge: 2018-02-21 | Payer: BC Managed Care – PPO

## 2018-02-20 ENCOUNTER — Encounter: Admit: 2018-02-20 | Discharge: 2018-02-20 | Payer: BC Managed Care – PPO

## 2018-02-20 DIAGNOSIS — T8544XD Capsular contracture of breast implant, subsequent encounter: ICD-10-CM

## 2018-02-20 DIAGNOSIS — Z421 Encounter for breast reconstruction following mastectomy: Principal | ICD-10-CM

## 2018-02-20 DIAGNOSIS — K929 Disease of digestive system, unspecified: ICD-10-CM

## 2018-02-20 DIAGNOSIS — Z789 Other specified health status: ICD-10-CM

## 2018-02-20 DIAGNOSIS — K219 Gastro-esophageal reflux disease without esophagitis: ICD-10-CM

## 2018-02-20 DIAGNOSIS — R112 Nausea with vomiting, unspecified: ICD-10-CM

## 2018-02-20 DIAGNOSIS — Z9221 Personal history of antineoplastic chemotherapy: ICD-10-CM

## 2018-02-20 DIAGNOSIS — C50911 Malignant neoplasm of unspecified site of right female breast: Principal | ICD-10-CM

## 2018-02-20 DIAGNOSIS — N650 Deformity of reconstructed breast: ICD-10-CM

## 2018-02-20 DIAGNOSIS — F419 Anxiety disorder, unspecified: ICD-10-CM

## 2018-03-02 ENCOUNTER — Encounter: Admit: 2018-03-02 | Discharge: 2018-03-02 | Payer: BC Managed Care – PPO

## 2018-03-02 DIAGNOSIS — C50411 Malignant neoplasm of upper-outer quadrant of right female breast: Secondary | ICD-10-CM

## 2018-03-02 DIAGNOSIS — Z5111 Encounter for antineoplastic chemotherapy: Principal | ICD-10-CM

## 2018-03-02 DIAGNOSIS — C50811 Malignant neoplasm of overlapping sites of right female breast: ICD-10-CM

## 2018-03-02 DIAGNOSIS — Z170 Estrogen receptor positive status [ER+]: ICD-10-CM

## 2018-03-02 DIAGNOSIS — Z79818 Long term (current) use of other agents affecting estrogen receptors and estrogen levels: ICD-10-CM

## 2018-03-02 DIAGNOSIS — Z9013 Acquired absence of bilateral breasts and nipples: ICD-10-CM

## 2018-03-02 DIAGNOSIS — Z79810 Long term (current) use of selective estrogen receptor modulators (SERMs): ICD-10-CM

## 2018-03-02 DIAGNOSIS — C50511 Malignant neoplasm of lower-outer quadrant of right female breast: ICD-10-CM

## 2018-03-02 MED ORDER — LIDOCAINE (PF) 10 MG/ML (1 %) IJ SOLN
5 mL | Freq: Once | SUBCUTANEOUS | 0 refills | Status: CP
Start: 2018-03-02 — End: ?
  Administered 2018-03-02: 17:00:00 5 mL via SUBCUTANEOUS

## 2018-03-02 MED ORDER — GOSERELIN 3.6 MG SC IMPL
3.6 mg | Freq: Once | SUBCUTANEOUS | 0 refills | Status: CP
Start: 2018-03-02 — End: ?
  Administered 2018-03-02: 17:00:00 3.6 mg via SUBCUTANEOUS

## 2018-03-22 ENCOUNTER — Encounter: Admit: 2018-03-22 | Discharge: 2018-03-22 | Payer: BC Managed Care – PPO

## 2018-03-29 ENCOUNTER — Encounter: Admit: 2018-03-29 | Discharge: 2018-03-29 | Payer: BC Managed Care – PPO

## 2018-03-30 ENCOUNTER — Encounter: Admit: 2018-03-30 | Discharge: 2018-03-30 | Payer: BC Managed Care – PPO

## 2018-03-30 DIAGNOSIS — C50411 Malignant neoplasm of upper-outer quadrant of right female breast: Secondary | ICD-10-CM

## 2018-03-30 DIAGNOSIS — Z170 Estrogen receptor positive status [ER+]: Secondary | ICD-10-CM

## 2018-03-30 DIAGNOSIS — C50511 Malignant neoplasm of lower-outer quadrant of right female breast: Secondary | ICD-10-CM

## 2018-03-30 DIAGNOSIS — Z5111 Encounter for antineoplastic chemotherapy: Secondary | ICD-10-CM

## 2018-03-30 DIAGNOSIS — Z9013 Acquired absence of bilateral breasts and nipples: Secondary | ICD-10-CM

## 2018-03-30 DIAGNOSIS — Z79810 Long term (current) use of selective estrogen receptor modulators (SERMs): Secondary | ICD-10-CM

## 2018-03-30 DIAGNOSIS — Z79818 Long term (current) use of other agents affecting estrogen receptors and estrogen levels: Secondary | ICD-10-CM

## 2018-03-30 DIAGNOSIS — C50811 Malignant neoplasm of overlapping sites of right female breast: Secondary | ICD-10-CM

## 2018-03-30 MED ORDER — GOSERELIN 3.6 MG SC IMPL
3.6 mg | Freq: Once | SUBCUTANEOUS | 0 refills | Status: CP
Start: 2018-03-30 — End: ?
  Administered 2018-03-30: 22:00:00 3.6 mg via SUBCUTANEOUS

## 2018-03-30 MED ORDER — LIDOCAINE (PF) 10 MG/ML (1 %) IJ SOLN
5 mL | Freq: Once | SUBCUTANEOUS | 0 refills | Status: CP
Start: 2018-03-30 — End: ?
  Administered 2018-03-30: 22:00:00 5 mL via SUBCUTANEOUS

## 2018-04-13 ENCOUNTER — Encounter: Admit: 2018-04-13 | Discharge: 2018-04-13 | Payer: BC Managed Care – PPO

## 2018-04-28 ENCOUNTER — Encounter: Admit: 2018-04-28 | Discharge: 2018-04-28 | Payer: BC Managed Care – PPO

## 2018-04-28 DIAGNOSIS — C50811 Malignant neoplasm of overlapping sites of right female breast: ICD-10-CM

## 2018-04-28 DIAGNOSIS — Z170 Estrogen receptor positive status [ER+]: ICD-10-CM

## 2018-04-28 DIAGNOSIS — Z5111 Encounter for antineoplastic chemotherapy: Principal | ICD-10-CM

## 2018-04-28 DIAGNOSIS — Z79818 Long term (current) use of other agents affecting estrogen receptors and estrogen levels: ICD-10-CM

## 2018-04-28 DIAGNOSIS — C50411 Malignant neoplasm of upper-outer quadrant of right female breast: ICD-10-CM

## 2018-04-28 DIAGNOSIS — Z9013 Acquired absence of bilateral breasts and nipples: ICD-10-CM

## 2018-04-28 DIAGNOSIS — Z79810 Long term (current) use of selective estrogen receptor modulators (SERMs): ICD-10-CM

## 2018-04-28 DIAGNOSIS — C50511 Malignant neoplasm of lower-outer quadrant of right female breast: ICD-10-CM

## 2018-04-28 MED ORDER — GOSERELIN 3.6 MG SC IMPL
3.6 mg | Freq: Once | SUBCUTANEOUS | 0 refills | Status: CP
Start: 2018-04-28 — End: ?
  Administered 2018-04-28: 16:00:00 3.6 mg via SUBCUTANEOUS

## 2018-04-28 MED ORDER — LIDOCAINE (PF) 10 MG/ML (1 %) IJ SOLN
5 mL | Freq: Once | SUBCUTANEOUS | 0 refills | Status: CP
Start: 2018-04-28 — End: ?
  Administered 2018-04-28: 16:00:00 5 mL via SUBCUTANEOUS

## 2018-04-30 ENCOUNTER — Encounter: Admit: 2018-04-30 | Discharge: 2018-04-30 | Payer: BC Managed Care – PPO

## 2018-05-01 ENCOUNTER — Encounter: Admit: 2018-05-01 | Discharge: 2018-05-01 | Payer: BC Managed Care – PPO

## 2018-05-14 ENCOUNTER — Encounter: Admit: 2018-05-14 | Discharge: 2018-05-14 | Payer: BC Managed Care – PPO

## 2018-05-14 MED ORDER — ZOLPIDEM 10 MG PO TAB
ORAL_TABLET | Freq: Every evening | ORAL | 1 refills | 30.00000 days | Status: AC
Start: 2018-05-14 — End: 2018-07-11

## 2018-05-21 ENCOUNTER — Encounter: Admit: 2018-05-21 | Discharge: 2018-05-21 | Payer: BC Managed Care – PPO

## 2018-05-25 ENCOUNTER — Encounter: Admit: 2018-05-25 | Discharge: 2018-05-25 | Payer: BC Managed Care – PPO

## 2018-05-25 DIAGNOSIS — N951 Menopausal and female climacteric states: ICD-10-CM

## 2018-05-25 DIAGNOSIS — Z79810 Long term (current) use of selective estrogen receptor modulators (SERMs): ICD-10-CM

## 2018-05-25 DIAGNOSIS — C50411 Malignant neoplasm of upper-outer quadrant of right female breast: Principal | ICD-10-CM

## 2018-05-25 DIAGNOSIS — Z170 Estrogen receptor positive status [ER+]: ICD-10-CM

## 2018-05-25 NOTE — Progress Notes
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  Debra Fleming felt a right breast lump in April 2016 and when it started to increase in size, she went to her PCP. Bilateral diagnostic mammogram 09/08/14 ([facility]) revealed a 2.3 cm irregular mass 9:00, 7 cm FTN in the right breast.  There were a few other focal symmetries in the right breast at 10:00, 12:00, and 6:00. In the left breast there was a 7 mm asymmetry in the lower inner quadrant, 7 cm FTN. Bilateral breast ultrasound 09/08/14 ([facility]) revealed in the right breast a 2.3 cm irregular hypoechoic mass at 9:00. There was also a 1.5 cm mass at 10:00, 7 cm FTN. Additional suspicious findings were noted at 6:00, 7 cm FTN measuring 7 mm and 12:00, 4 cm FTN measuring 5 mm. The right axilla appeared normal. In the left breast there was a 7 mm hypoechoic cluster of cysts with no internal vascularity. Right breast biopsy was recommended. A 6 month follow up left breast ultrasound was recommended (BI-RADS 5).      Imaging was repeated at [facility] 09/11/14.  Bilateral breast US 09/11/14 revealed an irregular right breast mass measuring up to 3.5cm at 9:00, 7cm FTN.  There was also an 8mm area at 10:00, 5cm FTN, a 4mm area at 12:00 4cm FTN, 7mm focus at 6:00 4cm FTN, 1cm area at 12:00 2cm FTN, and at 12:30 subareolar ,measuring 2 cm x 0.5 cm.  The left breast revealed a 9x4x42mm mass at 9:30, 2cm FTN.     She had three core needle biopsies 09/11/14.  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  Right breast biopsy at 12:00, 2cm FTN revealed usual ductal hyperplasia, no evidence of malignancy.  Right breast biopsy at 9:00, 7cm FTN revealed grade II IDC (ER 85%, PR 98%, Her2 1+, Hi-67: 6%). FAMILY HX: No family history of breast cancer.  Paternal grandmother (fallopian ca- genetic test negative). Maternal grandmother and grandfather with lung cancer. Maternal uncle throat cancer (age 86).  GYN: Menarche age 81. G2P2, ovaries/uterus intact.  On birth control x10 years, stopped at diagnosis  SOCIAL:  Hair dresser, married, lives in [place]    Objective:         ??? amitriptyline (ELAVIL) 25 mg tablet Take 25 mg by mouth at bedtime daily. Indications: Migraine Prevention   ??? gabapentin (NEURONTIN) 100 mg capsule TAKE 2 CAPSULES BY MOUTH EVERY 8 HOURS.   ??? gabapentin (NEURONTIN) 300 mg capsule TAKE 3 CAPSULES BY MOUTH AT BEDTIME DAILY.   ??? goserelin (ZOLADEX) 3.6 mg implant syringe Inject 3.6 mg under the skin once.   ??? solifenacin(+) (VESICARE) 10 mg tablet Take 10 mg by mouth daily.   ??? SUMAtriptan succinate (IMITREX) 50 mg tablet Take 50 mg by mouth as Needed for Migraine symptoms. Dose may be repeated in 2 hours if needed. Max of 2 tablets in 24 hours.   ??? tamoxifen (NOLVADEX) 20 mg tablet TAKE ONE TABLET BY MOUTH ONCE DAILY   ??? venlafaxine XR (EFFEXOR XR) 37.5 mg capsule TAKE 1 CAPSULE BY MOUTH DAILY. TAKE WITH FOOD.   ??? venlafaxine XR (EFFEXOR XR) 75 mg capsule TAKE 1 CAPSULE  BY MOUTH DAILY. TAKE WITH FOOD.   ??? zolpidem (AMBIEN) 10 mg tablet TAKE 1 TABLET BY MOUTH EVERY NIGHT AT BEDTIME     Vitals:    05/25/18 0921   BP: 119/79   Pulse: 92   Resp: 16   Temp: 36.7 ???C (98.1 ???F)   SpO2: 98%       Body mass index is 33.8 kg/m???.     Pain Score: Zero     Pain Addressed:  N/A    Patient Evaluated for a Clinical Trial: Patient not eligible for a treatment trial (including not needing treatment, needs palliative care, in remission).     [place] Cooperative Oncology Group performance status is 0, Fully active, able to carry on all pre-disease performance without restriction.Marland Kitchen     Physical Exam  Vitals signs reviewed.   Constitutional:       General: She is not in acute distress. Appearance: She is well-developed.   HENT:      Head: Normocephalic and atraumatic.   Eyes:      General: No scleral icterus.        Right eye: No discharge.         Left eye: No discharge.      Conjunctiva/sclera: Conjunctivae normal.   Neck:      Musculoskeletal: Normal range of motion.   Cardiovascular:      Rate and Rhythm: Normal rate and regular rhythm.      Heart sounds: Normal heart sounds.   Pulmonary:      Effort: Pulmonary effort is normal. No respiratory distress.      Breath sounds: Normal breath sounds. No wheezing.   Chest:      Chest wall: No tenderness.       Abdominal:      General: There is no distension.      Palpations: Abdomen is soft.      Tenderness: There is no abdominal tenderness.   Musculoskeletal: Normal range of motion.         General: No tenderness.   Lymphadenopathy:      Cervical: No cervical adenopathy.      Upper Body:      Right upper body: No supraclavicular adenopathy.      Left upper body: No supraclavicular adenopathy.   Skin:     General: Skin is warm and dry.      Findings: No erythema or rash.   Neurological:      Mental Status: She is alert and oriented to person, place, and time.      Cranial Nerves: No cranial nerve deficit.      Sensory: No sensory deficit.      Motor: No weakness.   Psychiatric:         Behavior: Behavior normal.         Thought Content: Thought content normal.         Judgment: Judgment normal.             Labs:  E2 05/14/15: 23  E2 07/02/15: 41  E2 10/01/15: 23  E2 12/31/15: 34  E2 07/21/16: 29  E2 11/17/16: <20    BMD 11/02/17: NORMAL            Assessment and Plan:  1.  33 y.o. pre-menopausal female with right, multicentric grade II IDC (ER 85%, PR 98%, Her2 1+, Hi-67: 6%).  S/P bilateral mastectomy/R SLNB, pT2N0.  She was found to have 3 areas of malignancy: Tumor #1 at 9:00 measured 2.3cm (  ER 90%, PR 100%, Her2 2+, FISH: 1.5 with avg #Her2 signals 4.01 (negative), Ki-67: 6%).  Tumor #2 at 6:00 measured 6 mm (ER 96%, PR

## 2018-05-25 NOTE — Patient Instructions
Team Members:  Dr. Priyanka Sharma, MD  Jaimie Heldstab, APRN  Lisa Her, RN  Anh Mangano, RN    Contact information:   Office phone: [phone]  Office fax: [phone]  After hours phone: [phone]    Address:  [address], [address]  [place], [place] [address]

## 2018-05-28 ENCOUNTER — Encounter: Admit: 2018-05-28 | Discharge: 2018-05-28 | Payer: BC Managed Care – PPO

## 2018-05-28 DIAGNOSIS — C50811 Malignant neoplasm of overlapping sites of right female breast: Principal | ICD-10-CM

## 2018-05-28 DIAGNOSIS — Z170 Estrogen receptor positive status [ER+]: ICD-10-CM

## 2018-05-28 DIAGNOSIS — Z79810 Long term (current) use of selective estrogen receptor modulators (SERMs): ICD-10-CM

## 2018-05-28 DIAGNOSIS — C50511 Malignant neoplasm of lower-outer quadrant of right female breast: ICD-10-CM

## 2018-05-28 DIAGNOSIS — Z9013 Acquired absence of bilateral breasts and nipples: ICD-10-CM

## 2018-05-28 DIAGNOSIS — C50411 Malignant neoplasm of upper-outer quadrant of right female breast: ICD-10-CM

## 2018-05-28 LAB — ESTRADIOL (E2): Lab: <15 pg/mL

## 2018-05-28 MED ORDER — GOSERELIN 3.6 MG SC IMPL
3.6 mg | Freq: Once | SUBCUTANEOUS | 0 refills | Status: CP
Start: 2018-05-28 — End: ?
  Administered 2018-05-28: 21:00:00 3.6 mg via SUBCUTANEOUS

## 2018-05-28 MED ORDER — LIDOCAINE (PF) 10 MG/ML (1 %) IJ SOLN
5 mL | Freq: Once | SUBCUTANEOUS | 0 refills | Status: CP
Start: 2018-05-28 — End: ?
  Administered 2018-05-28: 21:00:00 5 mL via SUBCUTANEOUS

## 2018-06-11 ENCOUNTER — Encounter: Admit: 2018-06-11 | Discharge: 2018-06-11 | Payer: BC Managed Care – PPO

## 2018-06-11 MED ORDER — VENLAFAXINE 75 MG PO CP24
75 mg | ORAL_CAPSULE | Freq: Every day | ORAL | 2 refills | Status: AC
Start: 2018-06-11 — End: 2019-03-11

## 2018-06-19 ENCOUNTER — Encounter: Admit: 2018-06-19 | Discharge: 2018-06-19 | Payer: BC Managed Care – PPO

## 2018-06-19 DIAGNOSIS — K219 Gastro-esophageal reflux disease without esophagitis: ICD-10-CM

## 2018-06-19 DIAGNOSIS — Z789 Other specified health status: ICD-10-CM

## 2018-06-19 DIAGNOSIS — K929 Disease of digestive system, unspecified: ICD-10-CM

## 2018-06-19 DIAGNOSIS — R112 Nausea with vomiting, unspecified: ICD-10-CM

## 2018-06-19 DIAGNOSIS — Z9221 Personal history of antineoplastic chemotherapy: ICD-10-CM

## 2018-06-19 DIAGNOSIS — F419 Anxiety disorder, unspecified: ICD-10-CM

## 2018-06-19 DIAGNOSIS — C50911 Malignant neoplasm of unspecified site of right female breast: Principal | ICD-10-CM

## 2018-06-29 ENCOUNTER — Encounter: Admit: 2018-06-29 | Discharge: 2018-06-29 | Payer: BC Managed Care – PPO

## 2018-06-29 DIAGNOSIS — Z170 Estrogen receptor positive status [ER+]: ICD-10-CM

## 2018-06-29 DIAGNOSIS — C50411 Malignant neoplasm of upper-outer quadrant of right female breast: ICD-10-CM

## 2018-06-29 DIAGNOSIS — Z9013 Acquired absence of bilateral breasts and nipples: ICD-10-CM

## 2018-06-29 DIAGNOSIS — C50811 Malignant neoplasm of overlapping sites of right female breast: Principal | ICD-10-CM

## 2018-06-29 DIAGNOSIS — C50511 Malignant neoplasm of lower-outer quadrant of right female breast: ICD-10-CM

## 2018-06-29 DIAGNOSIS — Z79810 Long term (current) use of selective estrogen receptor modulators (SERMs): ICD-10-CM

## 2018-06-29 DIAGNOSIS — Z79818 Long term (current) use of other agents affecting estrogen receptors and estrogen levels: ICD-10-CM

## 2018-06-29 MED ORDER — LIDOCAINE (PF) 10 MG/ML (1 %) IJ SOLN
5 mL | Freq: Once | SUBCUTANEOUS | 0 refills | Status: CP
Start: 2018-06-29 — End: ?
  Administered 2018-06-29: 16:00:00 5 mL via SUBCUTANEOUS

## 2018-06-29 MED ORDER — GOSERELIN 3.6 MG SC IMPL
3.6 mg | Freq: Once | SUBCUTANEOUS | 0 refills | Status: CP
Start: 2018-06-29 — End: ?
  Administered 2018-06-29: 16:00:00 3.6 mg via SUBCUTANEOUS

## 2018-06-29 NOTE — Progress Notes
Patient received Zoladex and tolerated without difficulty.  No pertinent changes since last assessment.

## 2018-07-10 ENCOUNTER — Encounter: Admit: 2018-07-10 | Discharge: 2018-07-10 | Payer: BC Managed Care – PPO

## 2018-07-11 ENCOUNTER — Encounter: Admit: 2018-07-11 | Discharge: 2018-07-11 | Payer: BC Managed Care – PPO

## 2018-07-11 MED ORDER — ZOLPIDEM 10 MG PO TAB
ORAL_TABLET | Freq: Every evening | 0 refills
Start: 2018-07-11 — End: ?

## 2018-07-11 MED ORDER — ZOLPIDEM 10 MG PO TAB
ORAL_TABLET | Freq: Every evening | ORAL | 1 refills | 30.00000 days | Status: DC
Start: 2018-07-11 — End: 2018-09-17

## 2018-07-27 ENCOUNTER — Encounter: Admit: 2018-07-27 | Discharge: 2018-07-27 | Payer: BC Managed Care – PPO

## 2018-07-27 DIAGNOSIS — Z79810 Long term (current) use of selective estrogen receptor modulators (SERMs): ICD-10-CM

## 2018-07-27 DIAGNOSIS — C50411 Malignant neoplasm of upper-outer quadrant of right female breast: Secondary | ICD-10-CM

## 2018-07-27 DIAGNOSIS — Z170 Estrogen receptor positive status [ER+]: ICD-10-CM

## 2018-07-27 DIAGNOSIS — K219 Gastro-esophageal reflux disease without esophagitis: ICD-10-CM

## 2018-07-27 DIAGNOSIS — Z79818 Long term (current) use of other agents affecting estrogen receptors and estrogen levels: ICD-10-CM

## 2018-07-27 DIAGNOSIS — C50911 Malignant neoplasm of unspecified site of right female breast: Principal | ICD-10-CM

## 2018-07-27 DIAGNOSIS — Z9221 Personal history of antineoplastic chemotherapy: ICD-10-CM

## 2018-07-27 DIAGNOSIS — F419 Anxiety disorder, unspecified: ICD-10-CM

## 2018-07-27 DIAGNOSIS — Z789 Other specified health status: ICD-10-CM

## 2018-07-27 DIAGNOSIS — K929 Disease of digestive system, unspecified: ICD-10-CM

## 2018-07-27 DIAGNOSIS — C50811 Malignant neoplasm of overlapping sites of right female breast: Principal | ICD-10-CM

## 2018-07-27 DIAGNOSIS — R112 Nausea with vomiting, unspecified: ICD-10-CM

## 2018-07-27 DIAGNOSIS — Z9013 Acquired absence of bilateral breasts and nipples: ICD-10-CM

## 2018-07-27 DIAGNOSIS — C50511 Malignant neoplasm of lower-outer quadrant of right female breast: ICD-10-CM

## 2018-07-27 MED ORDER — GOSERELIN 3.6 MG SC IMPL
3.6 mg | Freq: Once | SUBCUTANEOUS | 0 refills | Status: CP
Start: 2018-07-27 — End: ?
  Administered 2018-07-27: 16:00:00 3.6 mg via SUBCUTANEOUS

## 2018-07-27 MED ORDER — LIDOCAINE (PF) 10 MG/ML (1 %) IJ SOLN
5 mL | Freq: Once | SUBCUTANEOUS | 0 refills | Status: CP
Start: 2018-07-27 — End: ?
  Administered 2018-07-27: 16:00:00 5 mL via SUBCUTANEOUS

## 2018-08-14 ENCOUNTER — Encounter: Admit: 2018-08-14 | Discharge: 2018-08-14 | Payer: BC Managed Care – PPO

## 2018-08-14 MED ORDER — VENLAFAXINE 37.5 MG PO CP24
37.5 mg | ORAL_CAPSULE | Freq: Every day | ORAL | 2 refills | Status: DC
Start: 2018-08-14 — End: 2019-08-02

## 2018-08-27 ENCOUNTER — Encounter: Admit: 2018-08-27 | Discharge: 2018-08-27

## 2018-08-27 DIAGNOSIS — Z79818 Long term (current) use of other agents affecting estrogen receptors and estrogen levels: Secondary | ICD-10-CM

## 2018-08-27 DIAGNOSIS — Z79810 Long term (current) use of selective estrogen receptor modulators (SERMs): Secondary | ICD-10-CM

## 2018-08-27 DIAGNOSIS — Z170 Estrogen receptor positive status [ER+]: Secondary | ICD-10-CM

## 2018-08-27 DIAGNOSIS — C50411 Malignant neoplasm of upper-outer quadrant of right female breast: Secondary | ICD-10-CM

## 2018-08-27 DIAGNOSIS — C50811 Malignant neoplasm of overlapping sites of right female breast: Secondary | ICD-10-CM

## 2018-08-27 DIAGNOSIS — C50511 Malignant neoplasm of lower-outer quadrant of right female breast: Secondary | ICD-10-CM

## 2018-08-27 DIAGNOSIS — Z9013 Acquired absence of bilateral breasts and nipples: Secondary | ICD-10-CM

## 2018-08-27 MED ORDER — GOSERELIN 3.6 MG SC IMPL
3.6 mg | Freq: Once | SUBCUTANEOUS | 0 refills | Status: CP
Start: 2018-08-27 — End: ?
  Administered 2018-08-27: 16:00:00 3.6 mg via SUBCUTANEOUS

## 2018-08-27 MED ORDER — LIDOCAINE (PF) 10 MG/ML (1 %) IJ SOLN
5 mL | Freq: Once | SUBCUTANEOUS | 0 refills | Status: CP
Start: 2018-08-27 — End: ?
  Administered 2018-08-27: 16:00:00 5 mL via SUBCUTANEOUS

## 2018-08-27 NOTE — Progress Notes
Patient received zoladex and tolerated without difficulty.  No pertinent changes since last assessment.  Pt denies need for AVS, left ambulatory at 1103.

## 2018-08-28 MED ORDER — GABAPENTIN 300 MG PO CAP
900 mg | ORAL_CAPSULE | Freq: Every evening | ORAL | 2 refills | Status: DC
Start: 2018-08-28 — End: 2019-06-17

## 2018-09-15 ENCOUNTER — Encounter: Admit: 2018-09-15 | Discharge: 2018-09-15

## 2018-09-17 ENCOUNTER — Encounter: Admit: 2018-09-17 | Discharge: 2018-09-17

## 2018-09-17 MED ORDER — ZOLPIDEM 10 MG PO TAB
ORAL_TABLET | Freq: Every evening | ORAL | 1 refills | 30.00000 days | Status: DC
Start: 2018-09-17 — End: 2018-11-19

## 2018-09-17 MED ORDER — ZOLPIDEM 10 MG PO TAB
ORAL_TABLET | Freq: Every evening | 1 refills
Start: 2018-09-17 — End: ?

## 2018-09-26 ENCOUNTER — Encounter: Admit: 2018-09-26 | Discharge: 2018-09-26

## 2018-09-27 ENCOUNTER — Encounter: Admit: 2018-09-27 | Discharge: 2018-09-27

## 2018-09-27 DIAGNOSIS — Z79818 Long term (current) use of other agents affecting estrogen receptors and estrogen levels: Secondary | ICD-10-CM

## 2018-09-27 DIAGNOSIS — Z9013 Acquired absence of bilateral breasts and nipples: Secondary | ICD-10-CM

## 2018-09-27 DIAGNOSIS — C50411 Malignant neoplasm of upper-outer quadrant of right female breast: Secondary | ICD-10-CM

## 2018-09-27 DIAGNOSIS — Z170 Estrogen receptor positive status [ER+]: Secondary | ICD-10-CM

## 2018-09-27 DIAGNOSIS — C50811 Malignant neoplasm of overlapping sites of right female breast: Secondary | ICD-10-CM

## 2018-09-27 DIAGNOSIS — Z79810 Long term (current) use of selective estrogen receptor modulators (SERMs): Secondary | ICD-10-CM

## 2018-09-27 DIAGNOSIS — C50511 Malignant neoplasm of lower-outer quadrant of right female breast: Secondary | ICD-10-CM

## 2018-09-27 MED ORDER — GOSERELIN 3.6 MG SC IMPL
3.6 mg | Freq: Once | SUBCUTANEOUS | 0 refills | Status: CP
Start: 2018-09-27 — End: ?
  Administered 2018-09-27: 21:00:00 3.6 mg via SUBCUTANEOUS

## 2018-09-27 MED ORDER — LIDOCAINE (PF) 10 MG/ML (1 %) IJ SOLN
5 mL | Freq: Once | SUBCUTANEOUS | 0 refills | Status: CP
Start: 2018-09-27 — End: ?
  Administered 2018-09-27: 21:00:00 5 mL via SUBCUTANEOUS

## 2018-09-27 NOTE — Progress Notes
Patient received lidocaine/zoladex and tolerated without difficulty.  No pertinent changes since last assessment.

## 2018-10-10 ENCOUNTER — Encounter: Admit: 2018-10-10 | Discharge: 2018-10-10

## 2018-10-17 ENCOUNTER — Encounter: Admit: 2018-10-17 | Discharge: 2018-10-17

## 2018-10-17 MED ORDER — GABAPENTIN 100 MG PO CAP
ORAL_CAPSULE | 3 refills | Status: DC
Start: 2018-10-17 — End: 2018-11-01

## 2018-10-19 ENCOUNTER — Encounter: Admit: 2018-10-19 | Discharge: 2018-10-19

## 2018-10-19 ENCOUNTER — Ambulatory Visit: Admit: 2018-10-19 | Discharge: 2018-10-20

## 2018-10-19 DIAGNOSIS — K929 Disease of digestive system, unspecified: Secondary | ICD-10-CM

## 2018-10-19 DIAGNOSIS — Z421 Encounter for breast reconstruction following mastectomy: Secondary | ICD-10-CM

## 2018-10-19 DIAGNOSIS — C50911 Malignant neoplasm of unspecified site of right female breast: Secondary | ICD-10-CM

## 2018-10-19 DIAGNOSIS — F419 Anxiety disorder, unspecified: Secondary | ICD-10-CM

## 2018-10-19 DIAGNOSIS — N650 Deformity of reconstructed breast: Secondary | ICD-10-CM

## 2018-10-19 DIAGNOSIS — T8544XD Capsular contracture of breast implant, subsequent encounter: Secondary | ICD-10-CM

## 2018-10-19 DIAGNOSIS — Z9221 Personal history of antineoplastic chemotherapy: Secondary | ICD-10-CM

## 2018-10-19 DIAGNOSIS — K219 Gastro-esophageal reflux disease without esophagitis: Secondary | ICD-10-CM

## 2018-10-19 DIAGNOSIS — Z789 Other specified health status: Secondary | ICD-10-CM

## 2018-10-19 DIAGNOSIS — R112 Nausea with vomiting, unspecified: Secondary | ICD-10-CM

## 2018-10-19 NOTE — Progress Notes
Subjective:  HPI    Debra Fleming is a 33 y.o. female.  10/12/2016???NATRELLE INSPIRA [id number]???  07/26/2017???TISSUE GRAFT - Bilateral breast fat grafting from abdominal donor site (Bilateral)  PERIPROSTHETIC CAPSULECTOMY BREAST-Left lateral capsulectomy/capsulopexy???(Left)  REVISION RECONSTRUCTED BREAST (Right)  RIGHT BREAST FAT GRAFT 153 CC AND LEFT 207 CC  She is having more problems with the right implant position and finds it is painful.  She would like to proceed with downsizing the implants and continue with fat grafting.  She is comfortable with smaller implants    Review of Systems   Constitutional: Negative.    HENT: Negative.    Eyes: Negative.    Respiratory: Negative.    Cardiovascular: Negative.    Gastrointestinal: Negative.    Genitourinary: Negative.    Musculoskeletal: Negative.    Skin: Negative.    Neurological: Negative.    Endo/Heme/Allergies: Negative.    Psychiatric/Behavioral: Negative.        Current Outpatient Medications   Medication Sig Dispense Refill   ??? amitriptyline (ELAVIL) 25 mg tablet Take 25 mg by mouth at bedtime daily. Indications: Migraine Prevention     ??? gabapentin (NEURONTIN) 100 mg capsule TAKE 2 CAPSULES BY MOUTH EVERY 8HOURS. 120 capsule 3   ??? gabapentin (NEURONTIN) 300 mg capsule TAKE 3 CAPSULES BY MOUTH AT BEDTIME DAILY. 270 capsule 2   ??? goserelin (ZOLADEX) 3.6 mg implant syringe Inject 3.6 mg under the skin once.     ??? solifenacin(+) (VESICARE) 10 mg tablet Take 10 mg by mouth daily.     ??? SUMAtriptan succinate (IMITREX) 50 mg tablet Take 50 mg by mouth as Needed for Migraine symptoms. Dose may be repeated in 2 hours if needed. Max of 2 tablets in 24 hours.     ??? tamoxifen (NOLVADEX) 20 mg tablet TAKE ONE TABLET BY MOUTH ONCE DAILY 90 tablet 3   ??? venlafaxine XR (EFFEXOR XR) 37.5 mg capsule TAKE 1 CAPSULE BY MOUTH DAILY. TAKE WITH FOOD. 90 capsule 2   ??? venlafaxine XR (EFFEXOR XR) 75 mg capsule TAKE 1 CAPSULE BY MOUTH DAILY. TAKE WITH FOOD. 90 capsule 2 ??? zolpidem (AMBIEN) 10 mg tablet TAKE 1 TABLET BY MOUTH EVERY NIGHT AT BEDTIME 30 tablet 1       Objective:    Vitals:    10/19/18 1211   BP: (!) 143/88   Pulse: 89   Temp: 36.7 ???C (98 ???F)   Weight: 93.4 kg (206 lb)   Height: 167.6 cm (66)     Estimated body mass index is 33.25 kg/m??? as calculated from the following:    Height as of this encounter: 167.6 cm (66).    Weight as of this encounter: 93.4 kg (206 lb).  Body surface area is 2.09 meters squared.   Physical Exam  left16 cm   Right ?flipped  Lateral tight  Assessment:    No diagnosis found.    Plan:  Switch to sientra [id number] implants (order 3) and bilateral breast fat grafting abdomen/thigh donor sites  2 hr GA DS  Aware of size change and the expected result as well as recovery  No problem-specific Assessment & Plan notes found for this encounter.

## 2018-10-22 ENCOUNTER — Encounter: Admit: 2018-10-22 | Discharge: 2018-10-22

## 2018-11-01 ENCOUNTER — Encounter: Admit: 2018-11-01 | Discharge: 2018-11-01

## 2018-11-01 DIAGNOSIS — C50911 Malignant neoplasm of unspecified site of right female breast: Secondary | ICD-10-CM

## 2018-11-01 DIAGNOSIS — Z789 Other specified health status: Secondary | ICD-10-CM

## 2018-11-01 DIAGNOSIS — Z170 Estrogen receptor positive status [ER+]: Secondary | ICD-10-CM

## 2018-11-01 DIAGNOSIS — R112 Nausea with vomiting, unspecified: Secondary | ICD-10-CM

## 2018-11-01 DIAGNOSIS — K219 Gastro-esophageal reflux disease without esophagitis: Secondary | ICD-10-CM

## 2018-11-01 DIAGNOSIS — N951 Menopausal and female climacteric states: Secondary | ICD-10-CM

## 2018-11-01 DIAGNOSIS — Z9221 Personal history of antineoplastic chemotherapy: Secondary | ICD-10-CM

## 2018-11-01 DIAGNOSIS — Z79810 Long term (current) use of selective estrogen receptor modulators (SERMs): Secondary | ICD-10-CM

## 2018-11-01 DIAGNOSIS — C50411 Malignant neoplasm of upper-outer quadrant of right female breast: Secondary | ICD-10-CM

## 2018-11-01 DIAGNOSIS — F419 Anxiety disorder, unspecified: Secondary | ICD-10-CM

## 2018-11-01 DIAGNOSIS — K929 Disease of digestive system, unspecified: Secondary | ICD-10-CM

## 2018-11-01 LAB — ESTRADIOL (E2): Lab: <15 pg/mL (ref 80–100)

## 2018-11-01 MED ORDER — LIDOCAINE (PF) 10 MG/ML (1 %) IJ SOLN
5 mL | Freq: Once | SUBCUTANEOUS | 0 refills | Status: CP
Start: 2018-11-01 — End: ?
  Administered 2018-11-01: 17:00:00 5 mL via SUBCUTANEOUS

## 2018-11-01 MED ORDER — GOSERELIN 3.6 MG SC IMPL
3.6 mg | Freq: Once | SUBCUTANEOUS | 0 refills | Status: CP
Start: 2018-11-01 — End: ?
  Administered 2018-11-01: 17:00:00 3.6 mg via SUBCUTANEOUS

## 2018-11-01 NOTE — Progress Notes
Patient received prolia and tolerated without difficulty.  No pertinent changes since last assessment.

## 2018-11-01 NOTE — Patient Instructions
Team Members:  Dr. Priyanka Sharma, MD  Jaimie Heldstab, APRN  Lisa Her, RN  Hakim Minniefield, RN    Contact information:   Office phone: [phone]  Office fax: [phone]  After hours phone: [phone]    Address:  [address], [address]  [place], [place] [address]

## 2018-11-01 NOTE — Progress Notes
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  Debra Fleming felt a right breast lump in April 2016 and when it started to increase in size, she went to her PCP. Bilateral diagnostic mammogram 09/08/14 ([facility]) revealed a 2.3 cm irregular mass 9:00, 7 cm FTN in the right breast.  There were a few other focal symmetries in the right breast at 10:00, 12:00, and 6:00. In the left breast there was a 7 mm asymmetry in the lower inner quadrant, 7 cm FTN. Bilateral breast ultrasound 09/08/14 ([facility]) revealed in the right breast a 2.3 cm irregular hypoechoic mass at 9:00. There was also a 1.5 cm mass at 10:00, 7 cm FTN. Additional suspicious findings were noted at 6:00, 7 cm FTN measuring 7 mm and 12:00, 4 cm FTN measuring 5 mm. The right axilla appeared normal. In the left breast there was a 7 mm hypoechoic cluster of cysts with no internal vascularity. Right breast biopsy was recommended. A 6 month follow up left breast ultrasound was recommended (BI-RADS 5).      Imaging was repeated at [facility] 09/11/14.  Bilateral breast US 09/11/14 revealed an irregular right breast mass measuring up to 3.5cm at 9:00, 7cm FTN.  There was also an 8mm area at 10:00, 5cm FTN, a 4mm area at 12:00 4cm FTN, 7mm focus at 6:00 4cm FTN, 1cm area at 12:00 2cm FTN, and at 12:30 subareolar ,measuring 2 cm x 0.5 cm.  The left breast revealed a 9x4x43mm mass at 9:30, 2cm FTN.     She had three core needle biopsies 09/11/14.  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  Right breast biopsy at 12:00, 2cm FTN revealed usual ductal hyperplasia, no evidence of malignancy.  Right breast biopsy at 9:00, 7cm FTN revealed grade II IDC (ER 85%, PR 98%, Her2 1+, Hi-67: 6%). She had a bilateral mastectomy on 10/22/14 which revealed multicentric right, grade II, IDC. Tumor #1 at 9:00 measured 2.3cm (ER 90%, PR 100%, Her2 2+, FISH: 1.5 with avg #Her2 signals 4.01 (negative), Ki-67: 6%).  Tumor #2 at 6:00 measured 6 mm (ER 96%, PR 100%, Her2 1+, Ki-67 7%).  Tumor #3 (found incidentally) in the Left outer quadrant measured 2mm (ER and PR 100%, HER2 IHC 2+, Ki-67 17%, HER2 FISH negative); 4 negative SLN.  There was a distance of 2.5cm between the first and second mass.  Distance between second and the third lesion are unknown.     She received 4 cycles of adjuvant AC (8/18-10/8/16) followed by Taxol 10/24-12/29/16 - last dose was dose dense.    PRESENT THERAPY: Tamoxifen (started 06/2015), zoladex 12/2015 (rise in E2)    CHANGES SINCE LAST INTERVENTION: Debra Fleming returns to clinic for follow-up. She continues on monthly zoladex and tamoxifen daily. Hot flashes persist, but tolerable.  She has lost >10 pounds since her last visit and reports improvement in the way she feels. She is having smaller implants placed in September since hers keep flipping.           Review of Systems   Constitutional: Positive for fatigue (improved). Negative for unexpected weight change.        Hot flashes  HENT: Negative.    Eyes: Negative.  Negative for visual disturbance.   Respiratory: Negative.  Negative for cough and shortness of breath.    Cardiovascular: Negative.  Negative for chest pain.   Gastrointestinal: Negative.  Negative for abdominal pain, constipation, diarrhea, nausea and vomiting.   Endocrine: Negative.    Genitourinary: Negative.  Negative for difficulty urinating.   Musculoskeletal: Negative for arthralgias and back pain.   Skin: Negative.  Negative for rash.   Allergic/Immunologic: Negative.    Neurological: Positive for numbness (feet, hands, stable). Negative for dizziness, weakness and headaches.   Hematological: Negative.  Negative for adenopathy. Psychiatric/Behavioral: Positive for sleep disturbance (improved with ambien). The patient is not nervous/anxious.        PMH: Negative  FAMILY HX: No family history of breast cancer.  Paternal grandmother (fallopian ca- genetic test negative). Maternal grandmother and grandfather with lung cancer. Maternal uncle throat cancer (age 43).  GYN: Menarche age 59. G2P2, ovaries/uterus intact.  On birth control x10 years, stopped at diagnosis  SOCIAL:  Hair dresser, married, lives in [place]    Objective:         ??? amitriptyline (ELAVIL) 25 mg tablet Take 25 mg by mouth at bedtime daily. Indications: Migraine Prevention   ??? gabapentin (NEURONTIN) 300 mg capsule TAKE 3 CAPSULES BY MOUTH AT BEDTIME DAILY.   ??? goserelin (ZOLADEX) 3.6 mg implant syringe Inject 3.6 mg under the skin once.   ??? solifenacin(+) (VESICARE) 10 mg tablet Take 10 mg by mouth daily.   ??? SUMAtriptan succinate (IMITREX) 50 mg tablet Take 50 mg by mouth as Needed for Migraine symptoms. Dose may be repeated in 2 hours if needed. Max of 2 tablets in 24 hours.   ??? tamoxifen (NOLVADEX) 20 mg tablet TAKE ONE TABLET BY MOUTH ONCE DAILY   ??? venlafaxine XR (EFFEXOR XR) 37.5 mg capsule TAKE 1 CAPSULE BY MOUTH DAILY. TAKE WITH FOOD.   ??? venlafaxine XR (EFFEXOR XR) 75 mg capsule TAKE 1 CAPSULE BY MOUTH DAILY. TAKE WITH FOOD.   ??? zolpidem (AMBIEN) 10 mg tablet TAKE 1 TABLET BY MOUTH EVERY NIGHT AT BEDTIME     Vitals:    11/01/18 1030   BP: 126/74   Pulse: 89   Resp: 16   Temp: 36.4 ???C (97.5 ???F)   SpO2: 100%       Body mass index is 31.07 kg/m???.     Pain Score: Zero     Pain Addressed:  N/A    Patient Evaluated for a Clinical Trial: Patient not eligible for a treatment trial (including not needing treatment, needs palliative care, in remission).     [place] Cooperative Oncology Group performance status is 0, Fully active, able to carry on all pre-disease performance without restriction.Marland Kitchen     Physical Exam  Vitals signs reviewed.   Constitutional: General: She is not in acute distress.     Appearance: She is well-developed.   HENT:      Head: Normocephalic and atraumatic.   Eyes:      General: No scleral icterus.        Right eye: No discharge.         Left eye: No discharge.      Conjunctiva/sclera: Conjunctivae normal.   Neck:      Musculoskeletal: Normal range of motion.   Cardiovascular:      Rate and Rhythm: Normal rate and regular rhythm.      Heart sounds: Normal heart sounds.   Pulmonary:  Effort: Pulmonary effort is normal. No respiratory distress.      Breath sounds: Normal breath sounds. No wheezing.   Chest:      Chest wall: No tenderness.       Abdominal:      General: There is no distension.      Palpations: Abdomen is soft.   Musculoskeletal: Normal range of motion.         General: No tenderness.   Lymphadenopathy:      Cervical: No cervical adenopathy.      Upper Body:      Right upper body: No supraclavicular adenopathy.      Left upper body: No supraclavicular adenopathy.   Skin:     General: Skin is warm and dry.      Coloration: Skin is not jaundiced or pale.      Findings: No erythema or rash.   Neurological:      Mental Status: She is alert and oriented to person, place, and time.      Cranial Nerves: No cranial nerve deficit.      Sensory: No sensory deficit.      Motor: No weakness.   Psychiatric:         Behavior: Behavior normal.         Thought Content: Thought content normal.         Judgment: Judgment normal.             Labs:  E2 05/14/15: 23  E2 07/02/15: 41  E2 10/01/15: 23  E2 12/31/15: 34  E2 07/21/16: 29  E2 11/17/16: <20  E2 05/28/18: <15.0    Bone Health:  BMD 11/02/17: NORMAL            Assessment and Plan:  1.  33 y.o. pre-menopausal female with right, multicentric grade II IDC (ER 85%, PR 98%, Her2 1+, Hi-67: 6%).  S/P bilateral mastectomy/R SLNB, pT2N0.  She was found to have 3 areas of malignancy: Tumor #1 at 9:00 measured 2.3cm (ER 90%, PR 100%, Her2 2+, FISH: 1.5 with avg #Her2 signals 4.01 (negative), Ki-67: 6%).  Tumor #2 at 6:00 measured 6 mm (ER 96%, PR 100%, Her2 1+, Ki-67 7%).  Tumor #3 in the Left outer quadrant measured 2mm (ER and PR 100%, HER2 IHC 2+, Ki-67 17%, HER2 FISH negative); 4 negative SLN. Completed adjuvant AC-Taxol 02/2015.  Started adjuvant tamoxifen in 04/2015 and OFS in 12/2015 for rising E2.  Continue with tamoxifen as menopausal symptoms are still present.    2. Long term plan includes 5-10 years of endocrine therapy.  E2 prior to starting tamoxifen was 23. Level from 09/2015 was 23 and was 220 on 12/31/15. At that time, we started Zoladex for OFS. While hot flashes are still present, she does want to continue with monthly injections.     3. Hot flashes:  Effexor at 112.5 mg PO QDAY and Gabapentin 300 qam and 600 mg PO QHS.      4. 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     5. Genetic testing. [employer] 26 gene panel testing was negative.    6. BMD normal for expected age (T score not provided). Repeat 10/2019.     7.  Debra Fleming has last >10 pounds since her last visit.  I encouraged her to continue with healthy diet and exercise.      8.  Will have implants exchanged in September, ok to continue  tamoxifen.     RTC in 6 months with Dr Raleigh Callas. Continue with monthly zoladex       Francie Massing, APRN-NP    Collaborating MD: Osborn Coho

## 2018-11-02 ENCOUNTER — Encounter: Admit: 2018-11-02 | Discharge: 2018-11-02

## 2018-11-16 ENCOUNTER — Encounter: Admit: 2018-11-16 | Discharge: 2018-11-16

## 2018-11-19 ENCOUNTER — Encounter: Admit: 2018-11-19 | Discharge: 2018-11-19

## 2018-11-19 MED ORDER — ZOLPIDEM 10 MG PO TAB
ORAL_TABLET | Freq: Every evening | ORAL | 2 refills | 30.00000 days | Status: DC
Start: 2018-11-19 — End: 2018-11-20

## 2018-11-20 MED ORDER — ZOLPIDEM 10 MG PO TAB
ORAL_TABLET | Freq: Every evening | ORAL | 4 refills | 30.00000 days | Status: DC
Start: 2018-11-20 — End: 2019-04-10

## 2018-11-30 ENCOUNTER — Encounter: Admit: 2018-11-30 | Discharge: 2018-11-30

## 2018-11-30 DIAGNOSIS — C50411 Malignant neoplasm of upper-outer quadrant of right female breast: Principal | ICD-10-CM

## 2018-11-30 DIAGNOSIS — Z170 Estrogen receptor positive status [ER+]: Secondary | ICD-10-CM

## 2018-11-30 MED ORDER — LIDOCAINE (PF) 10 MG/ML (1 %) IJ SOLN
5 mL | Freq: Once | SUBCUTANEOUS | 0 refills | Status: CP
Start: 2018-11-30 — End: ?
  Administered 2018-11-30: 20:00:00 5 mL via SUBCUTANEOUS

## 2018-11-30 MED ORDER — GOSERELIN 3.6 MG SC IMPL
3.6 mg | Freq: Once | SUBCUTANEOUS | 0 refills | Status: CP
Start: 2018-11-30 — End: ?
  Administered 2018-11-30: 20:00:00 3.6 mg via SUBCUTANEOUS

## 2018-11-30 NOTE — Telephone Encounter
Spoke to pt regarding rescheduling of appts as requested.  She asked me to change the 1/1 to 1/4 as she is having surgery on 12/28 and doesn't know if she'll have recovered.  She also asked that we change the appts on 1/28 to 2/4 because she can only have one injection per month per her insurance or they will not pay.  I told her i'd pass this info on to cnc and if you needed to speak with her, you would reach out to her.

## 2018-11-30 NOTE — Telephone Encounter
-----   Message from [place] Her, RN sent at 11/30/2018  9:32 AM CDT -----  Regarding: Zoladex  Hello this patient is needing her injections reschedule starting from October 2.    10/2 - moved to 10/8 3:30pm or later  11/6 - zoladex 3:30pm or later  12/4 - zoladex 3:30pm or later  1/1 - zoladex 3:30pm or later  1/21 appts moved to 1/28 around the same time

## 2018-11-30 NOTE — Progress Notes
Patient arrived to CC treatment for Zoladex. VS stable. Patient received Zoladex and tolerated without difficulty. Schedule reviewed and given to patient. Pt left clinic without further questions or concerns. - Latonja Bobeck, RN

## 2019-01-01 ENCOUNTER — Encounter: Admit: 2019-01-01 | Discharge: 2019-01-01 | Payer: BC Managed Care – PPO

## 2019-01-03 MED ORDER — GOSERELIN 3.6 MG SC IMPL
3.6 mg | Freq: Once | SUBCUTANEOUS | 0 refills | Status: CP
Start: 2019-01-03 — End: ?
  Administered 2019-01-03: 21:00:00 3.6 mg via SUBCUTANEOUS

## 2019-01-03 MED ORDER — LIDOCAINE (PF) 10 MG/ML (1 %) IJ SOLN
5 mL | Freq: Once | SUBCUTANEOUS | 0 refills | Status: CP
Start: 2019-01-03 — End: ?
  Administered 2019-01-03: 21:00:00 5 mL via SUBCUTANEOUS

## 2019-01-03 NOTE — Progress Notes
Per orders of Dr. Donneta Romberg, injection of Lidocaine/Zoladex given by Christiana Fuchs, RN. Patient instructed to remain in clinic for 20 minutes afterwards, and to report any adverse reaction to me immediately.

## 2019-01-09 ENCOUNTER — Encounter: Admit: 2019-01-09 | Discharge: 2019-01-09 | Payer: BC Managed Care – PPO

## 2019-01-09 MED ORDER — TAMOXIFEN 20 MG PO TAB
ORAL_TABLET | Freq: Every day | 3 refills | Status: AC
Start: 2019-01-09 — End: ?

## 2019-01-12 ENCOUNTER — Encounter: Admit: 2019-01-12 | Discharge: 2019-01-12 | Payer: BC Managed Care – PPO

## 2019-01-18 ENCOUNTER — Encounter: Admit: 2019-01-18 | Discharge: 2019-01-18 | Payer: BC Managed Care – PPO

## 2019-01-21 MED ORDER — GABAPENTIN 100 MG PO CAP
ORAL_CAPSULE | Freq: Three times a day (TID) | 3 refills
Start: 2019-01-21 — End: ?

## 2019-01-28 ENCOUNTER — Encounter: Admit: 2019-01-28 | Discharge: 2019-01-28 | Payer: BC Managed Care – PPO

## 2019-01-29 MED ORDER — GABAPENTIN 100 MG PO CAP
ORAL_CAPSULE | Freq: Three times a day (TID) | 3 refills
Start: 2019-01-29 — End: ?

## 2019-02-01 ENCOUNTER — Encounter: Admit: 2019-02-01 | Discharge: 2019-02-01 | Payer: BC Managed Care – PPO

## 2019-02-01 DIAGNOSIS — C50411 Malignant neoplasm of upper-outer quadrant of right female breast: Secondary | ICD-10-CM

## 2019-02-01 MED ORDER — GOSERELIN 3.6 MG SC IMPL
3.6 mg | Freq: Once | SUBCUTANEOUS | 0 refills | Status: CP
Start: 2019-02-01 — End: ?

## 2019-02-01 MED ORDER — LIDOCAINE (PF) 10 MG/ML (1 %) IJ SOLN
5 mL | Freq: Once | SUBCUTANEOUS | 0 refills | Status: CP
Start: 2019-02-01 — End: ?

## 2019-02-01 NOTE — Progress Notes
Per orders of Dr. Donneta Romberg, injection of lidocaine/Zoladex given by Christiana Fuchs, RN. Patient instructed to remain in clinic for 20 minutes afterwards, and to report any adverse reaction to me immediately.

## 2019-02-08 ENCOUNTER — Encounter: Admit: 2019-02-08 | Discharge: 2019-02-08 | Payer: BC Managed Care – PPO

## 2019-02-11 MED ORDER — GABAPENTIN 100 MG PO CAP
ORAL_CAPSULE | Freq: Three times a day (TID) | 3 refills | Status: DC
Start: 2019-02-11 — End: 2019-06-17

## 2019-02-12 ENCOUNTER — Encounter: Admit: 2019-02-12 | Discharge: 2019-02-12 | Payer: BC Managed Care – PPO

## 2019-02-13 ENCOUNTER — Encounter: Admit: 2019-02-13 | Discharge: 2019-02-13 | Payer: BC Managed Care – PPO

## 2019-02-22 ENCOUNTER — Encounter: Admit: 2019-02-22 | Discharge: 2019-02-22 | Payer: BC Managed Care – PPO

## 2019-02-26 ENCOUNTER — Encounter: Admit: 2019-02-26 | Discharge: 2019-02-26 | Payer: BC Managed Care – PPO

## 2019-02-26 ENCOUNTER — Ambulatory Visit: Admit: 2019-02-26 | Discharge: 2019-02-26 | Payer: BC Managed Care – PPO

## 2019-02-26 DIAGNOSIS — Z9889 Other specified postprocedural states: Secondary | ICD-10-CM

## 2019-02-26 DIAGNOSIS — Z20828 Contact with and (suspected) exposure to other viral communicable diseases: Secondary | ICD-10-CM

## 2019-02-26 DIAGNOSIS — C50919 Malignant neoplasm of unspecified site of unspecified female breast: Secondary | ICD-10-CM

## 2019-03-01 ENCOUNTER — Encounter: Admit: 2019-03-01 | Discharge: 2019-03-01 | Payer: BC Managed Care – PPO

## 2019-03-01 DIAGNOSIS — C50411 Malignant neoplasm of upper-outer quadrant of right female breast: Secondary | ICD-10-CM

## 2019-03-01 MED ORDER — LIDOCAINE (PF) 10 MG/ML (1 %) IJ SOLN
5 mL | Freq: Once | SUBCUTANEOUS | 0 refills | Status: CP
Start: 2019-03-01 — End: ?
  Administered 2019-03-01: 22:00:00 5 mL via SUBCUTANEOUS

## 2019-03-01 MED ORDER — GOSERELIN 3.6 MG SC IMPL
3.6 mg | Freq: Once | SUBCUTANEOUS | 0 refills | Status: CP
Start: 2019-03-01 — End: ?
  Administered 2019-03-01: 22:00:00 3.6 mg via SUBCUTANEOUS

## 2019-03-01 NOTE — Progress Notes
Patient received [id number] and tolerated without difficulty. No pertinent changes since last assessment.

## 2019-03-04 ENCOUNTER — Encounter: Admit: 2019-03-04 | Discharge: 2019-03-04 | Payer: BC Managed Care – PPO

## 2019-03-05 ENCOUNTER — Encounter: Admit: 2019-03-05 | Discharge: 2019-03-05 | Payer: BC Managed Care – PPO

## 2019-03-05 ENCOUNTER — Ambulatory Visit: Admit: 2019-03-05 | Discharge: 2019-03-06 | Payer: BC Managed Care – PPO

## 2019-03-05 DIAGNOSIS — Z789 Other specified health status: Secondary | ICD-10-CM

## 2019-03-05 DIAGNOSIS — Z9221 Personal history of antineoplastic chemotherapy: Secondary | ICD-10-CM

## 2019-03-05 DIAGNOSIS — Z421 Encounter for breast reconstruction following mastectomy: Secondary | ICD-10-CM

## 2019-03-05 DIAGNOSIS — F419 Anxiety disorder, unspecified: Secondary | ICD-10-CM

## 2019-03-05 DIAGNOSIS — R112 Nausea with vomiting, unspecified: Secondary | ICD-10-CM

## 2019-03-05 DIAGNOSIS — C50911 Malignant neoplasm of unspecified site of right female breast: Secondary | ICD-10-CM

## 2019-03-05 DIAGNOSIS — K219 Gastro-esophageal reflux disease without esophagitis: Secondary | ICD-10-CM

## 2019-03-05 DIAGNOSIS — K929 Disease of digestive system, unspecified: Secondary | ICD-10-CM

## 2019-03-05 MED ORDER — OXYCODONE 5 MG PO TAB
5-10 mg | ORAL_TABLET | ORAL | 0 refills | 6.00000 days | Status: AC | PRN
Start: 2019-03-05 — End: ?

## 2019-03-05 MED ORDER — DOXYCYCLINE HYCLATE 100 MG PO TAB
100 mg | ORAL_TABLET | Freq: Two times a day (BID) | ORAL | 0 refills | 8.00000 days | Status: AC
Start: 2019-03-05 — End: ?

## 2019-03-05 MED ORDER — DIAZEPAM 5 MG PO TAB
5 mg | ORAL_TABLET | ORAL | 0 refills | 7.00000 days | Status: AC | PRN
Start: 2019-03-05 — End: ?

## 2019-03-05 MED ORDER — DIAZEPAM 5 MG PO TAB
5 mg | ORAL_TABLET | ORAL | 0 refills | 7.00000 days | Status: DC | PRN
Start: 2019-03-05 — End: 2019-03-05

## 2019-03-05 MED ORDER — ONDANSETRON HCL 4 MG PO TAB
4 mg | ORAL_TABLET | ORAL | 0 refills | 8.00000 days | Status: AC | PRN
Start: 2019-03-05 — End: ?

## 2019-03-05 NOTE — Progress Notes
Subjective:  HPI    Debra Fleming is a 33 y.o. female.  10/12/2016?NATRELLE INSPIRA [id number]?  07/26/2017?TISSUE GRAFT - Bilateral breast fat grafting from abdominal donor site (Bilateral)  PERIPROSTHETIC CAPSULECTOMY BREAST-Left lateral capsulectomy/capsulopexy?(Left)  REVISION RECONSTRUCTED BREAST (Right)  RIGHT BREAST FAT GRAFT 153 CC AND LEFT 207 CC    Debra Fleming presents today for her preop visit.  She is scheduled for a bilateral implant exchange and fat grafting with Dr. Loney Laurence Jan 6th.  She has had multiple problems with her current implants flipping.  It has always occurred on the right, but now she feels that it may have also occurred on the left.  She is looking forward to downsizing her implants.  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      Review of Systems   Constitutional: Negative.    HENT: Negative.    Eyes: Negative.    Respiratory: Negative.    Cardiovascular: Negative.    Gastrointestinal: Negative.    Genitourinary: Negative.    Musculoskeletal: Negative.    Skin: Negative.    Neurological: Negative.    Endo/Heme/Allergies: Negative.    Psychiatric/Behavioral: Negative.        Current Outpatient Medications   Medication Sig Dispense Refill   ? amitriptyline (ELAVIL) 25 mg tablet Take 25 mg by mouth at bedtime daily. Indications: Migraine Prevention     ? diazePAM (VALIUM) 5 mg tablet Take one tablet by mouth every 8 hours as needed for Anxiety. Indications: muscle spasm 30 tablet 0   ? doxycycline (VIBRAMYCIN) 100 mg tablet Take one tablet by mouth twice daily for 14 days. 28 tablet 0   ? gabapentin (NEURONTIN) 100 mg capsule take 2 capsules BY MOUTH EVERY 8 HOURS 120 capsule 3   ? gabapentin (NEURONTIN) 300 mg capsule TAKE 3 CAPSULES BY MOUTH AT BEDTIME DAILY. 270 capsule 2   ? goserelin (ZOLADEX) 3.6 mg implant syringe Inject 3.6 mg under the skin once. ? ondansetron (ZOFRAN) 4 mg tablet Take one tablet by mouth every 8 hours as needed for Nausea or Vomiting. 20 tablet 0   ? oxyCODONE (ROXICODONE) 5 mg tablet Take one tablet to two tablets by mouth every 4 hours as needed for Pain 20 tablet 0   ? solifenacin(+) (VESICARE) 10 mg tablet Take 10 mg by mouth daily.     ? SUMAtriptan succinate (IMITREX) 50 mg tablet Take 50 mg by mouth as Needed for Migraine symptoms. Dose may be repeated in 2 hours if needed. Max of 2 tablets in 24 hours.     ? tamoxifen (NOLVADEX) 20 mg tablet TAKE ONE TABLET BY MOUTH ONCE DAILY 90 tablet 3   ? venlafaxine XR (EFFEXOR XR) 37.5 mg capsule TAKE 1 CAPSULE BY MOUTH DAILY. TAKE WITH FOOD. 90 capsule 2   ? venlafaxine XR (EFFEXOR XR) 75 mg capsule TAKE 1 CAPSULE BY MOUTH DAILY. TAKE WITH FOOD. 90 capsule 2   ? zolpidem (AMBIEN) 10 mg tablet take 1 tablet BY MOUTH EVERY NIGHT AT BEDTIME 30 tablet 4     Objective:    Vitals:    03/05/19 0900   BP: (!) 125/90   BP Source: Arm, Left Upper   Patient Position: Sitting   Pulse: 88   Temp: 36.2 ?C (97.2 ?F)   Weight: 88.5 kg (195 lb)   Height: 167.6 cm (66)   PainSc: Zero     Estimated body mass index is 31.47 kg/m? as  calculated from the following:    Height as of this encounter: 167.6 cm (66).    Weight as of this encounter: 88.5 kg (195 lb).  Body surface area is 2.03 meters squared.     Physical Exam     Bilateral breast surgically absent; bilateral silicone implants intact; well healed mastectomy incisions with lateral tightness; good upper pole fill bilaterally    Assessment:    1. Encounter for breast reconstruction following mastectomy        Plan:  Bilateral Breast Implant Exchange; Bilateral Breast Fat Grafting  Discussed post op instructions with patient; questions answered.   Consent signed today.  Post-op scripts sent to pharmacy for Doxycycline; Oxycodone; Zofran and Valium.    Wilhemina Cash [id number] implants x3  No problem-specific Assessment & Plan notes found for this encounter. Orders Placed This Encounter   ? doxycycline (VIBRAMYCIN) 100 mg tablet   ? oxyCODONE (ROXICODONE) 5 mg tablet   ? ondansetron (ZOFRAN) 4 mg tablet   ? diazePAM (VALIUM) 5 mg tablet

## 2019-03-09 ENCOUNTER — Encounter: Admit: 2019-03-09 | Discharge: 2019-03-09 | Payer: BC Managed Care – PPO

## 2019-03-11 MED ORDER — VENLAFAXINE 75 MG PO CP24
75 mg | ORAL_CAPSULE | Freq: Every day | ORAL | 2 refills | Status: AC
Start: 2019-03-11 — End: ?

## 2019-03-12 ENCOUNTER — Encounter: Admit: 2019-03-12 | Discharge: 2019-03-12 | Payer: BC Managed Care – PPO

## 2019-03-12 DIAGNOSIS — C50911 Malignant neoplasm of unspecified site of right female breast: Secondary | ICD-10-CM

## 2019-03-12 DIAGNOSIS — R112 Nausea with vomiting, unspecified: Secondary | ICD-10-CM

## 2019-03-12 DIAGNOSIS — K929 Disease of digestive system, unspecified: Secondary | ICD-10-CM

## 2019-03-12 DIAGNOSIS — Z789 Other specified health status: Secondary | ICD-10-CM

## 2019-03-12 DIAGNOSIS — K219 Gastro-esophageal reflux disease without esophagitis: Secondary | ICD-10-CM

## 2019-03-12 DIAGNOSIS — F419 Anxiety disorder, unspecified: Secondary | ICD-10-CM

## 2019-03-12 DIAGNOSIS — Z9221 Personal history of antineoplastic chemotherapy: Secondary | ICD-10-CM

## 2019-03-13 ENCOUNTER — Encounter: Admit: 2019-03-13 | Discharge: 2019-03-14 | Payer: BC Managed Care – PPO

## 2019-03-13 DIAGNOSIS — Z20828 Contact with and (suspected) exposure to other viral communicable diseases: Principal | ICD-10-CM

## 2019-03-14 ENCOUNTER — Encounter: Admit: 2019-03-14 | Discharge: 2019-03-14 | Payer: BC Managed Care – PPO

## 2019-03-14 NOTE — Progress Notes
Pt called after receiving new of positive COVID screen for pre-op clearance. Pt reports she did test positive at another facility  02/11/19, quarantined as directed, and has been cleared. Pt denies fever/cough, any concerns, remains asymptomatic. MD notified, anesthesia notified, and ID notified. Test results from outside facility obtained. OK to proceed as scheduled per surgery team. Tilda Burrow, RN

## 2019-03-14 NOTE — Telephone Encounter
Left message for patient to return call for test results.    Demetrius Charity, BSN, RN

## 2019-03-14 NOTE — Telephone Encounter
Called patient and verified full name and date of birth. Informed patient of positive COVID-19 Test Result. Patient reports having a positive COVID test in [place] around 6 weeks ago and completed her quarantine at that time.  Patient currently is not experiencing any new symptoms of COVID-19 but was swabbed for upcoming procedure.  Patient reported talking to Plastic Surgery this morning to see if surgery could still be performed and waiting call back at this time.        Patient enrolled in [place]: No (no active infection)    Demetrius Charity, BSN, RN

## 2019-03-15 ENCOUNTER — Ambulatory Visit: Admit: 2019-03-15 | Discharge: 2019-03-15 | Payer: BC Managed Care – PPO

## 2019-03-15 ENCOUNTER — Encounter: Admit: 2019-03-15 | Discharge: 2019-03-15 | Payer: BC Managed Care – PPO

## 2019-03-15 DIAGNOSIS — K219 Gastro-esophageal reflux disease without esophagitis: Secondary | ICD-10-CM

## 2019-03-15 DIAGNOSIS — Z789 Other specified health status: Secondary | ICD-10-CM

## 2019-03-15 DIAGNOSIS — R112 Nausea with vomiting, unspecified: Secondary | ICD-10-CM

## 2019-03-15 DIAGNOSIS — F419 Anxiety disorder, unspecified: Secondary | ICD-10-CM

## 2019-03-15 DIAGNOSIS — C50911 Malignant neoplasm of unspecified site of right female breast: Secondary | ICD-10-CM

## 2019-03-15 DIAGNOSIS — K929 Disease of digestive system, unspecified: Secondary | ICD-10-CM

## 2019-03-15 DIAGNOSIS — Z9221 Personal history of antineoplastic chemotherapy: Secondary | ICD-10-CM

## 2019-03-15 MED ORDER — DEXAMETHASONE SODIUM PHOSPHATE 10 MG/ML IJ SOLN
0 refills | Status: DC
Start: 2019-03-15 — End: 2019-03-15
  Administered 2019-03-15: 21:00:00 10 mg via INTRAVENOUS

## 2019-03-15 MED ORDER — KETAMINE 10 MG/ML IJ SOLN
0 refills | Status: DC
Start: 2019-03-15 — End: 2019-03-15
  Administered 2019-03-15: 21:00:00 30 mg via INTRAVENOUS

## 2019-03-15 MED ORDER — ACETAMINOPHEN 500 MG PO TAB
1000 mg | Freq: Once | ORAL | 0 refills | Status: CP
Start: 2019-03-15 — End: ?

## 2019-03-15 MED ORDER — CELECOXIB 200 MG PO CAP
200 mg | Freq: Once | ORAL | 0 refills | Status: CN
Start: 2019-03-15 — End: ?

## 2019-03-15 MED ORDER — BACITRACIN-POLYMYXIN B 500-10,000 UNIT/GRAM TP OINT
0 refills | Status: DC
Start: 2019-03-15 — End: 2019-03-15
  Administered 2019-03-15: 23:00:00 14 g via TOPICAL

## 2019-03-15 MED ORDER — FENTANYL CITRATE (PF) 50 MCG/ML IJ SOLN
0 refills | Status: DC
Start: 2019-03-15 — End: 2019-03-15
  Administered 2019-03-15: 22:00:00 25 ug via INTRAVENOUS
  Administered 2019-03-15: 20:00:00 150 ug via INTRAVENOUS
  Administered 2019-03-15: 23:00:00 25 ug via INTRAVENOUS
  Administered 2019-03-15 (×2): 100 ug via INTRAVENOUS
  Administered 2019-03-15: 22:00:00 25 ug via INTRAVENOUS

## 2019-03-15 MED ORDER — OXYCODONE 5 MG PO TAB
5 mg | ORAL_TABLET | ORAL | 0 refills | 6.00000 days | Status: AC | PRN
Start: 2019-03-15 — End: ?
  Filled 2019-03-15: qty 10, 2d supply, fill #1

## 2019-03-15 MED ORDER — ONDANSETRON HCL (PF) 4 MG/2 ML IJ SOLN
INTRAVENOUS | 0 refills | Status: DC
Start: 2019-03-15 — End: 2019-03-15
  Administered 2019-03-15: 23:00:00 4 mg via INTRAVENOUS

## 2019-03-15 MED ORDER — KETOROLAC 30 MG/ML (1 ML) IJ SOLN
0 refills | Status: DC
Start: 2019-03-15 — End: 2019-03-15
  Administered 2019-03-15: 23:00:00 30 mg via INTRAVENOUS

## 2019-03-15 MED ORDER — FENTANYL CITRATE (PF) 50 MCG/ML IJ SOLN
25 ug | INTRAVENOUS | 0 refills | Status: DC | PRN
Start: 2019-03-15 — End: 2019-03-16

## 2019-03-15 MED ORDER — ONDANSETRON HCL (PF) 4 MG/2 ML IJ SOLN
4 mg | Freq: Once | INTRAVENOUS | 0 refills | Status: DC | PRN
Start: 2019-03-15 — End: 2019-03-16

## 2019-03-15 MED ORDER — EPINEPHRINE 1 MG/ML IJ SOLN
0 refills | Status: DC
Start: 2019-03-15 — End: 2019-03-15
  Administered 2019-03-15 (×3): 1 mg via INTRAVENOUS

## 2019-03-15 MED ORDER — PROMETHAZINE 25 MG/ML IJ SOLN
6.25 mg | INTRAVENOUS | 0 refills | Status: DC | PRN
Start: 2019-03-15 — End: 2019-03-15

## 2019-03-15 MED ORDER — FENTANYL CITRATE (PF) 50 MCG/ML IJ SOLN
50 ug | INTRAVENOUS | 0 refills | Status: DC | PRN
Start: 2019-03-15 — End: 2019-03-16
  Administered 2019-03-15 – 2019-03-16 (×2): 50 ug via INTRAVENOUS

## 2019-03-15 MED ORDER — PROPOFOL 10 MG/ML IV EMUL 100 ML (INFUSION)(AM)(OR)
0 refills | Status: DC
Start: 2019-03-15 — End: 2019-03-15
  Administered 2019-03-15: 22:00:00 100.000 mL via INTRAVENOUS
  Administered 2019-03-15: 21:00:00 150 ug/kg/min via INTRAVENOUS
  Administered 2019-03-15: 21:00:00 200 ug/kg/min via INTRAVENOUS

## 2019-03-15 MED ORDER — DOXYCYCLINE HYCLATE 100 MG PO TAB
100 mg | ORAL_TABLET | Freq: Two times a day (BID) | ORAL | 0 refills | Status: CN
Start: 2019-03-15 — End: ?

## 2019-03-15 MED ORDER — FAMOTIDINE (PF) 20 MG/2 ML IV SOLN
0 refills | Status: DC
Start: 2019-03-15 — End: 2019-03-15
  Administered 2019-03-15: 21:00:00 20 mg via INTRAVENOUS

## 2019-03-15 MED ORDER — LIDOCAINE HCL 10 MG/ML (1 %) IJ SOLN
0 refills | Status: DC
Start: 2019-03-15 — End: 2019-03-15
  Administered 2019-03-15 (×3): 30 mL via INTRAMUSCULAR

## 2019-03-15 MED ORDER — BUPIVACAINE-EPINEPHRINE 0.5 %-1:200,000 IJ SOLN
0 refills | Status: DC
Start: 2019-03-15 — End: 2019-03-15
  Administered 2019-03-15: 21:00:00 14 mL via INTRAMUSCULAR

## 2019-03-15 MED ORDER — ACETAMINOPHEN 500 MG PO TAB
1000 mg | Freq: Once | ORAL | 0 refills | Status: CN
Start: 2019-03-15 — End: ?

## 2019-03-15 MED ORDER — OXYCODONE 5 MG PO TAB
5-10 mg | Freq: Once | ORAL | 0 refills | Status: DC | PRN
Start: 2019-03-15 — End: 2019-03-15

## 2019-03-15 MED ORDER — PROMETHAZINE 25 MG/ML IJ SOLN
6.25 mg | INTRAVENOUS | 0 refills | Status: DC | PRN
Start: 2019-03-15 — End: 2019-03-16

## 2019-03-15 MED ORDER — OXYCODONE 5 MG PO TAB
5-10 mg | Freq: Once | ORAL | 0 refills | Status: CP | PRN
Start: 2019-03-15 — End: ?
  Administered 2019-03-15: 10 mg via ORAL

## 2019-03-15 MED ORDER — FENTANYL CITRATE (PF) 50 MCG/ML IJ SOLN
25-50 ug | INTRAVENOUS | 0 refills | Status: DC | PRN
Start: 2019-03-15 — End: 2019-03-16

## 2019-03-15 MED ORDER — MIDAZOLAM 1 MG/ML IJ SOLN
INTRAVENOUS | 0 refills | Status: DC
Start: 2019-03-15 — End: 2019-03-15
  Administered 2019-03-15: 20:00:00 2 mg via INTRAVENOUS

## 2019-03-15 MED ORDER — SENNOSIDES 8.6 MG PO TAB
2 | ORAL_TABLET | Freq: Two times a day (BID) | ORAL | 0 refills | Status: CN | PRN
Start: 2019-03-15 — End: ?

## 2019-03-15 MED ORDER — LIDOCAINE (PF) 10 MG/ML (1 %) IJ SOLN
.1-2 mL | INTRAMUSCULAR | 0 refills | Status: CN | PRN
Start: 2019-03-15 — End: ?

## 2019-03-15 MED ORDER — LIDOCAINE (PF) 200 MG/10 ML (2 %) IJ SYRG
0 refills | Status: DC
Start: 2019-03-15 — End: 2019-03-15
  Administered 2019-03-15: 20:00:00 80 mg via INTRAVENOUS

## 2019-03-15 MED ORDER — CEFAZOLIN INJ 1GM IVP
2 g | Freq: Once | INTRAVENOUS | 0 refills | Status: CP
Start: 2019-03-15 — End: ?
  Administered 2019-03-15: 21:00:00 2 g via INTRAVENOUS

## 2019-03-15 MED ORDER — FENTANYL CITRATE (PF) 50 MCG/ML IJ SOLN
50 ug | INTRAVENOUS | 0 refills | Status: DC | PRN
Start: 2019-03-15 — End: 2019-03-16
  Administered 2019-03-15 (×2): 50 ug via INTRAVENOUS

## 2019-03-15 MED ORDER — PROPOFOL INJ 10 MG/ML IV VIAL
0 refills | Status: DC
Start: 2019-03-15 — End: 2019-03-15
  Administered 2019-03-15: 21:00:00 100 mg via INTRAVENOUS
  Administered 2019-03-15: 20:00:00 200 mg via INTRAVENOUS

## 2019-03-15 MED ORDER — HALOPERIDOL LACTATE 5 MG/ML IJ SOLN
1 mg | Freq: Once | INTRAVENOUS | 0 refills | Status: DC | PRN
Start: 2019-03-15 — End: 2019-03-16

## 2019-03-15 MED ORDER — LACTATED RINGERS IV SOLP
0 refills | Status: CP
Start: 2019-03-15 — End: ?
  Administered 2019-03-15 (×3): 1000 mL via INTRAVENOUS

## 2019-03-15 MED ORDER — HALOPERIDOL LACTATE 5 MG/ML IJ SOLN
1 mg | Freq: Once | INTRAVENOUS | 0 refills | Status: DC | PRN
Start: 2019-03-15 — End: 2019-03-15

## 2019-03-15 MED ORDER — LACTATED RINGERS IV SOLP
INTRAVENOUS | 0 refills | Status: DC
Start: 2019-03-15 — End: 2019-03-16
  Administered 2019-03-15 (×2): 1000.000 mL via INTRAVENOUS

## 2019-03-15 MED ORDER — ONDANSETRON HCL (PF) 4 MG/2 ML IJ SOLN
4 mg | Freq: Once | INTRAVENOUS | 0 refills | Status: DC | PRN
Start: 2019-03-15 — End: 2019-03-15

## 2019-03-15 MED ORDER — MEPERIDINE (PF) 25 MG/ML IJ SYRG
12.5 mg | INTRAVENOUS | 0 refills | Status: DC | PRN
Start: 2019-03-15 — End: 2019-03-16

## 2019-03-15 MED ADMIN — ACETAMINOPHEN 500 MG PO TAB [102]: 1000 mg | ORAL | @ 19:00:00 | Stop: 2019-03-15 | NDC 00904673061

## 2019-03-16 NOTE — Anesthesia Post-Procedure Evaluation
Post-Anesthesia Evaluation    Name: Debra Fleming      MRN: [id number]     DOB: 09/06/1985     Age: 33 y.o.     Sex: female   __________________________________________________________________________     Procedure Information     Anesthesia Start Date/Time: 03/15/19 1420    Procedures:       GRAFTING AUTOLOGOUS FAT BY LIPOSUCTION TO BREASTS LEFT 165 CC, RIGHT 168 CC TOTAL 333 CC (Bilateral Abdomen)      GRAFTING AUTOLOGOUS FAT BY LIPOSUCTION TO BREASTS LEFT 165 CC, RIGHT 168 CC TOTAL 333 CC (Bilateral Abdomen)      INSERTION SIENTRA 10721-540MP BREAST PROSTHESIS IN RECONSTRUCTION - IMMEDIATE (Bilateral Breast)    Location: [facility] OR [facility] / [place] MAIN OR/PERIOP    Surgeon: Ardelle Anton, MD          Post-Anesthesia Vitals  BP: 146/96 (12/18 1800)  Temp: 36.5 C (97.7 F) (12/18 1300)  Pulse: 83 (12/18 1800)  Respirations: 13 PER MINUTE (12/18 1800)  SpO2: 99 % (12/18 1800)  SpO2 Pulse: 83 (12/18 1800)  Height: 167.6 cm (66") (12/18 1300)   Vitals Value Taken Time   BP 146/96 03/15/19 1800   Temp     Pulse 83 03/15/19 1800   Respirations 13 PER MINUTE 03/15/19 1800   SpO2 99 % 03/15/19 1800         Post Anesthesia Evaluation Note    Evaluation location: pre/post  Patient participation: recovered; patient participated in evaluation  Level of consciousness: alert    Pain score: 4  Pain management: adequate    Hydration: normovolemia  Temperature: 36.0C - 38.4C  Airway patency: adequate    Perioperative Events       Post-op nausea and vomiting: no PONV    Postoperative Status  Cardiovascular status: hemodynamically stable  Respiratory status: spontaneous ventilation        Perioperative Events  Perioperative Event: No  Emergency Case Activation: No

## 2019-03-17 ENCOUNTER — Encounter: Admit: 2019-03-17 | Discharge: 2019-03-17 | Payer: BC Managed Care – PPO

## 2019-03-17 DIAGNOSIS — Z789 Other specified health status: Secondary | ICD-10-CM

## 2019-03-17 DIAGNOSIS — R112 Nausea with vomiting, unspecified: Secondary | ICD-10-CM

## 2019-03-17 DIAGNOSIS — K929 Disease of digestive system, unspecified: Secondary | ICD-10-CM

## 2019-03-17 DIAGNOSIS — K219 Gastro-esophageal reflux disease without esophagitis: Secondary | ICD-10-CM

## 2019-03-17 DIAGNOSIS — C50911 Malignant neoplasm of unspecified site of right female breast: Secondary | ICD-10-CM

## 2019-03-17 DIAGNOSIS — F419 Anxiety disorder, unspecified: Secondary | ICD-10-CM

## 2019-03-17 DIAGNOSIS — Z9221 Personal history of antineoplastic chemotherapy: Secondary | ICD-10-CM

## 2019-03-18 ENCOUNTER — Encounter: Admit: 2019-03-18 | Discharge: 2019-03-18 | Payer: BC Managed Care – PPO

## 2019-04-01 ENCOUNTER — Encounter: Admit: 2019-04-01 | Discharge: 2019-04-01 | Payer: BC Managed Care – PPO

## 2019-04-01 ENCOUNTER — Ambulatory Visit: Admit: 2019-04-01 | Discharge: 2019-04-01 | Payer: BC Managed Care – PPO

## 2019-04-01 DIAGNOSIS — Z421 Encounter for breast reconstruction following mastectomy: Secondary | ICD-10-CM

## 2019-04-01 DIAGNOSIS — C50411 Malignant neoplasm of upper-outer quadrant of right female breast: Secondary | ICD-10-CM

## 2019-04-01 MED ORDER — LIDOCAINE (PF) 10 MG/ML (1 %) IJ SOLN
2 mL | Freq: Once | SUBCUTANEOUS | 0 refills | Status: CP
Start: 2019-04-01 — End: ?

## 2019-04-01 MED ORDER — GOSERELIN 3.6 MG SC IMPL
3.6 mg | Freq: Once | SUBCUTANEOUS | 0 refills | Status: CP
Start: 2019-04-01 — End: ?
  Administered 2019-04-01: 21:00:00 3.6 mg via SUBCUTANEOUS

## 2019-04-01 NOTE — Progress Notes
Date of Service: 04/01/2019                    History of Present Illness  Debra Fleming is a 34 y.o. female.  03/11/2020 GRAFTING AUTOLOGOUS FAT BY LIPOSUCTION TO BREASTS LEFT 165 CC, RIGHT 168 CC TOTAL 333 CC (Bilateral)  GRAFTING AUTOLOGOUS FAT BY LIPOSUCTION TO BREASTS LEFT 165 CC, RIGHT 168 CC TOTAL 333 CC (Bilateral)  INSERTION SIENTRA 10721-540MP BREAST PROSTHESIS IN RECONSTRUCTION - IMMEDIATE (Bilateral)  Doing well  Still limiting activities  Pleased with the current size  ?     Review of Systems   Constitutional: Negative.    HENT: Negative.    Eyes: Negative.    Respiratory: Negative.    Cardiovascular: Negative.    Gastrointestinal: Negative.    Endocrine: Negative.    Genitourinary: Negative.    Musculoskeletal: Negative.    Skin: Negative.    Allergic/Immunologic: Negative.    Neurological: Negative.    Hematological: Negative.    Psychiatric/Behavioral: Negative.        Medical History:   Diagnosis Date   ? Acid reflux     chemo induced   ? Anxiety disorder    ? Gastrointestinal disorder    ? History of chemotherapy 2017   ? Limb alert care status 6/16    right limb alert   ? 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 PONV (postoperative nausea and vomiting)         Surgical History:   Procedure Laterality Date   ? LIPOMA RESECTION  2004    back   ? BREAST BIOPSY Right 09/11/14   ? BILATERAL NIPPLE-SPARING MASTECTOMIES, RIGHT SENTINEL LYMPH NODE BIOPSY Bilateral 10/22/2014    Performed by Cordelia Poche, MD at [facility] OR   ? INSERTION TISSUE EXPANDER BREAST Bilateral 10/22/2014    Performed by Corliss Skains, MD at [facility] OR   ? INSERTION VENOUS ACCESS PORT Left 10/22/2014    Performed by Simonne Martinet, MD at [facility] OR   ? TUNNELED VENOUS PORT REMOVAL  2017   ? RECONSTRUCTION BREAST/ BILATERAL TISSUE EXPANDER REMOVAL/ SILICONE IMPLANT PLACEMENT, PORT REMOVAL Bilateral 05/04/2015    Performed by Corliss Skains, MD at [facility] OR   ? BILATERAL CAPSULOTOMIES Bilateral 05/04/2015 Performed by Corliss Skains, MD at [facility] OR   ? INSERTION FAT GRAFT BREAST FROM ABDOMEN Bilateral 10/14/2015    Performed by Corliss Skains, MD at [facility] OR   ? LEFT BREAST MOUND REVISION WITH FAT GRAFTING; RIGHT BREAST RECONSTRUCTION WITH FAT GRAFTING Bilateral 04/01/2016    Performed by Corliss Skains, MD at [facility] OR   ? Bilateral fat grafting from abdomen and thighs Bilateral 04/01/2016    Performed by Corliss Skains, MD at [facility] OR   ? BILATERAL BREAST REVISION WITH CASULOTOMY AND IMPLANT EXCHANGE Bilateral 10/12/2016    Performed by Corliss Skains, MD at [facility] OR   ? LIPOSUCTION TO AXILLARY FOLDS Bilateral 10/12/2016    Performed by Corliss Skains, MD at [facility] OR   ? TISSUE GRAFT - Bilateral breast fat grafting from abdominal donor site Bilateral 07/26/2017    Performed by Corliss Skains, MD at [facility] OR   ? PERIPROSTHETIC CAPSULECTOMY BREAST-Left lateral capsulectomy with ADM support Left 07/26/2017    Performed by Loney Laurence, Pearletha Furl, MD at [facility] OR   ? REVISION RECONSTRUCTED BREAST Right 07/26/2017    Performed by Corliss Skains, MD at  [facility] OR   ? GRAFTING AUTOLOGOUS FAT BY LIPOSUCTION TO BREASTS LEFT 165 CC, RIGHT 168 CC TOTAL 333 CC Bilateral 03/15/2019    Performed by Corliss Skains, MD at [facility] OR   ? GRAFTING AUTOLOGOUS FAT BY LIPOSUCTION TO BREASTS LEFT 165 CC, RIGHT 168 CC TOTAL 333 CC Bilateral 03/15/2019    Performed by Corliss Skains, MD at [facility] OR   ? INSERTION SIENTRA 16109-604VW BREAST PROSTHESIS IN RECONSTRUCTION - IMMEDIATE Bilateral 03/15/2019    Performed by Corliss Skains, MD at [facility] OR        Family History   Problem Relation Age of Onset   ? High Cholesterol Mother    ? Arthritis-rheumatoid Mother    ? Arthritis-rheumatoid Maternal Aunt    ? Cancer-Lung Maternal Grandmother    ? Arthritis-rheumatoid Maternal Grandmother    ? Cancer-Lung Maternal Grandfather    ? Cancer-Uterine Paternal Grandmother ? Arthritis-rheumatoid Paternal Grandmother    ? Cancer-Prostate Paternal Grandfather    ? Diabetes Paternal Grandfather         Social History     Socioeconomic History   ? Marital status: Married     Spouse name: Not on file   ? Number of children: Not on file   ? Years of education: Not on file   ? Highest education level: Not on file   Occupational History   ? Not on file   Tobacco Use   ? Smoking status: Former Smoker     Packs/day: 0.25     Years: 4.00     Pack years: 1.00     Types: Cigarettes     Quit date: 08/10/2005     Years since quitting: 13.6   ? Smokeless tobacco: Never Used   Substance and Sexual Activity   ? Alcohol use: Not Currently     Alcohol/week: 0.0 standard drinks     Comment: rarely   ? Drug use: No   ? Sexual activity: Yes     Partners: Male   Other Topics Concern   ? Not on file   Social History Narrative   ? Not on file          Objective:         ? amitriptyline (ELAVIL) 25 mg tablet Take 25 mg by mouth at bedtime daily. Indications: Migraine Prevention   ? diazePAM (VALIUM) 5 mg tablet Take one tablet by mouth every 8 hours as needed for Anxiety. Indications: muscle spasm   ? gabapentin (NEURONTIN) 100 mg capsule take 2 capsules BY MOUTH EVERY 8 HOURS   ? gabapentin (NEURONTIN) 300 mg capsule TAKE 3 CAPSULES BY MOUTH AT BEDTIME DAILY.   ? goserelin (ZOLADEX) 3.6 mg implant syringe Inject 3.6 mg under the skin once.   ? lisinopriL (ZESTRIL) 20 mg tablet Take 20 mg by mouth daily.   ? ondansetron (ZOFRAN) 4 mg tablet Take one tablet by mouth every 8 hours as needed for Nausea or Vomiting.   ? oxyCODONE (ROXICODONE) 5 mg tablet Take one tablet by mouth every 4 hours as needed for Pain (Take 1-2 tabs every 6 hours for pain rated 5/10 or higher; No more than 6 tabs in 24-hour period.)   ? oxyCODONE (ROXICODONE) 5 mg tablet Take one tablet to two tablets by mouth every 4 hours as needed for Pain ? senna (SENOKOT) 8.6 mg tablet Take two tablets by mouth twice daily as needed for Constipation (Please take while taking oxycodone.).   ? solifenacin(+) (VESICARE)  10 mg tablet Take 10 mg by mouth daily.   ? SUMAtriptan succinate (IMITREX) 50 mg tablet Take 50 mg by mouth as Needed for Migraine symptoms. Dose may be repeated in 2 hours if needed. Max of 2 tablets in 24 hours.   ? tamoxifen (NOLVADEX) 20 mg tablet TAKE ONE TABLET BY MOUTH ONCE DAILY   ? venlafaxine XR (EFFEXOR XR) 37.5 mg capsule TAKE 1 CAPSULE BY MOUTH DAILY. TAKE WITH FOOD.   ? venlafaxine XR (EFFEXOR XR) 75 mg capsule TAKE 1 CAPSULE BY MOUTH DAILY. TAKE WITH FOOD.   ? zolpidem (AMBIEN) 10 mg tablet take 1 tablet BY MOUTH EVERY NIGHT AT BEDTIME     Vitals:    04/01/19 1139   BP: 133/76   BP Source: Arm, Left Upper   Patient Position: Sitting   Pulse: 97   Temp: 36.2 ?C (97.1 ?F)   TempSrc: Temporal   SpO2: 100%   Weight: 91.7 kg (202 lb 3.2 oz)   Height: 168.9 cm (66.5)   PainSc: Three     Body mass index is 32.15 kg/m?Marland Kitchen  Body surface area is 2.07 meters squared.          Physical Exam  Healing well  Some tightness  Scars feel tight laterally    Assessment and Plan:  Discussed possible z plasty later  Check in 2-3 months

## 2019-04-01 NOTE — Progress Notes
Per orders of Dr. Donneta Romberg, injection of Zoladex/lidocaine given by Christiana Fuchs, RN. Patient instructed to remain in clinic for 20 minutes afterwards, and to report any adverse reaction to me immediately.

## 2019-04-04 ENCOUNTER — Encounter: Admit: 2019-04-04 | Discharge: 2019-04-04 | Payer: BC Managed Care – PPO

## 2019-04-10 ENCOUNTER — Encounter: Admit: 2019-04-10 | Discharge: 2019-04-10 | Payer: BC Managed Care – PPO

## 2019-04-10 MED ORDER — ZOLPIDEM 10 MG PO TAB
ORAL_TABLET | Freq: Every evening | ORAL | 4 refills | 30.00000 days | Status: DC
Start: 2019-04-10 — End: 2019-04-24

## 2019-04-20 ENCOUNTER — Encounter: Admit: 2019-04-20 | Discharge: 2019-04-20 | Payer: BC Managed Care – PPO

## 2019-04-22 MED ORDER — ZOLPIDEM 10 MG PO TAB
ORAL_TABLET | Freq: Every evening | 4 refills
Start: 2019-04-22 — End: ?

## 2019-04-22 MED ORDER — ZOLPIDEM 10 MG PO TAB
ORAL_TABLET | Freq: Every evening | 4 refills | Status: CN
Start: 2019-04-22 — End: ?

## 2019-04-23 ENCOUNTER — Encounter: Admit: 2019-04-23 | Discharge: 2019-04-23 | Payer: BC Managed Care – PPO

## 2019-04-24 ENCOUNTER — Encounter: Admit: 2019-04-24 | Discharge: 2019-04-24 | Payer: BC Managed Care – PPO

## 2019-04-24 MED ORDER — ZOLPIDEM 10 MG PO TAB
ORAL_TABLET | Freq: Every evening | ORAL | 4 refills | 30.00000 days | Status: DC
Start: 2019-04-24 — End: 2019-09-09

## 2019-04-24 MED ORDER — ZOLPIDEM 10 MG PO TAB
ORAL_TABLET | Freq: Every evening | 4 refills
Start: 2019-04-24 — End: ?

## 2019-04-29 ENCOUNTER — Encounter: Admit: 2019-04-29 | Discharge: 2019-04-29 | Payer: BC Managed Care – PPO

## 2019-05-01 ENCOUNTER — Encounter: Admit: 2019-05-01 | Discharge: 2019-05-01 | Payer: BC Managed Care – PPO

## 2019-05-02 ENCOUNTER — Encounter: Admit: 2019-05-02 | Discharge: 2019-05-02 | Payer: BC Managed Care – PPO

## 2019-05-02 DIAGNOSIS — C50411 Malignant neoplasm of upper-outer quadrant of right female breast: Secondary | ICD-10-CM

## 2019-05-02 DIAGNOSIS — F419 Anxiety disorder, unspecified: Secondary | ICD-10-CM

## 2019-05-02 DIAGNOSIS — Z9221 Personal history of antineoplastic chemotherapy: Secondary | ICD-10-CM

## 2019-05-02 DIAGNOSIS — Z780 Asymptomatic menopausal state: Secondary | ICD-10-CM

## 2019-05-02 DIAGNOSIS — Z79810 Long term (current) use of selective estrogen receptor modulators (SERMs): Secondary | ICD-10-CM

## 2019-05-02 DIAGNOSIS — K219 Gastro-esophageal reflux disease without esophagitis: Secondary | ICD-10-CM

## 2019-05-02 DIAGNOSIS — R112 Nausea with vomiting, unspecified: Secondary | ICD-10-CM

## 2019-05-02 DIAGNOSIS — Z789 Other specified health status: Secondary | ICD-10-CM

## 2019-05-02 DIAGNOSIS — C50919 Malignant neoplasm of unspecified site of unspecified female breast: Secondary | ICD-10-CM

## 2019-05-02 DIAGNOSIS — C50911 Malignant neoplasm of unspecified site of right female breast: Secondary | ICD-10-CM

## 2019-05-02 DIAGNOSIS — Z9189 Other specified personal risk factors, not elsewhere classified: Secondary | ICD-10-CM

## 2019-05-02 DIAGNOSIS — K929 Disease of digestive system, unspecified: Secondary | ICD-10-CM

## 2019-05-02 LAB — ESTRADIOL (E2): Lab: <15 pg/mL

## 2019-05-02 MED ORDER — GOSERELIN 3.6 MG SC IMPL
3.6 mg | Freq: Once | SUBCUTANEOUS | 0 refills | Status: CP
Start: 2019-05-02 — End: ?
  Administered 2019-05-02: 21:00:00 3.6 mg via SUBCUTANEOUS

## 2019-05-02 MED ORDER — LIDOCAINE (PF) 10 MG/ML (1 %) IJ SOLN
2 mL | Freq: Once | SUBCUTANEOUS | 0 refills | Status: CP
Start: 2019-05-02 — End: ?
  Administered 2019-05-02: 21:00:00 2 mL via SUBCUTANEOUS

## 2019-05-02 NOTE — Patient Instructions
Team Members:  Dr. Priyanka Sharma, MD  Jaimie Heldstab, APRN  Lisa Her, RN  Kadisha Goodine, RN    Contact information:   Office phone: [phone]  Office fax: [phone]  After hours phone: [phone]    Address:  [address], [address]  [place], [place] [address]

## 2019-05-02 NOTE — Progress Notes
Pt here for Lidocaine subcut followed by Zoladex subcut injection in same location.  Tolerated well with no c/o.  Vitals taken in exam.  AVS provided for pt as requested.  Pt dismissed per self.  Gait steady.

## 2019-05-02 NOTE — Progress Notes
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 Usery is a premenopausal female diagnosed with right breast cancer at age 34.  Ms. Reisinger felt a right breast lump in April 2016 and when it started to increase in size, she went to her PCP. Bilateral diagnostic mammogram 09/08/14 ([facility]) revealed a 2.3 cm irregular mass 9:00, 7 cm FTN in the right breast.  There were a few other focal symmetries in the right breast at 10:00, 12:00, and 6:00. In the left breast there was a 7 mm asymmetry in the lower inner quadrant, 7 cm FTN. Bilateral breast ultrasound 09/08/14 ([facility]) revealed in the right breast a 2.3 cm irregular hypoechoic mass at 9:00. There was also a 1.5 cm mass at 10:00, 7 cm FTN. Additional suspicious findings were noted at 6:00, 7 cm FTN measuring 7 mm and 12:00, 4 cm FTN measuring 5 mm. The right axilla appeared normal. In the left breast there was a 7 mm hypoechoic cluster of cysts with no internal vascularity. Right breast biopsy was recommended. A 6 month follow up left breast ultrasound was recommended (BI-RADS 5).      Imaging was repeated at [facility] 09/11/14.  Bilateral breast US 09/11/14 revealed an irregular right breast mass measuring up to 3.5cm at 9:00, 7cm FTN.  There was also an 8mm area at 10:00, 5cm FTN, a 4mm area at 12:00 4cm FTN, 7mm focus at 6:00 4cm FTN, 1cm area at 12:00 2cm FTN, and at 12:30 subareolar ,measuring 2 cm x 0.5 cm.  The left breast revealed a 9x4x81mm mass at 9:30, 2cm FTN.     She had three core needle biopsies 09/11/14.  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  Right breast biopsy at 12:00, 2cm FTN revealed usual ductal hyperplasia, no evidence of malignancy.  Right breast biopsy at 9:00, 7cm FTN revealed grade II IDC (ER 85%, PR 98%, Her2 1+, Hi-67: 6%). She had a bilateral mastectomy on 10/22/14 which revealed multicentric right, grade II, IDC. Tumor #1 at 9:00 measured 2.3cm (ER 90%, PR 100%, Her2 2+, FISH: 1.5 with avg #Her2 signals 4.01 (negative), Ki-67: 6%).  Tumor #2 at 6:00 measured 6 mm (ER 96%, PR 100%, Her2 1+, Ki-67 7%).  Tumor #3 (found incidentally) in the Left outer quadrant measured 2mm (ER and PR 100%, HER2 IHC 2+, Ki-67 17%, HER2 FISH negative); 4 negative SLN.  There was a distance of 2.5cm between the first and second mass.  Distance between second and the third lesion are unknown.     She received 4 cycles of adjuvant AC (8/18-10/8/16) followed by Taxol 10/24-12/29/16 - last dose was dose dense.    PRESENT THERAPY: Tamoxifen (started 06/2015), zoladex 12/2015 (rise in E2)    CHANGES SINCE LAST INTERVENTION: Ms. Sakal returns to clinic for follow-up. She continues on monthly zoladex and tamoxifen daily. Hot flashes persist, but tolerable.  She had new implants placed in January. She reports increased depression and anxiety and is interested in medication adjustments.         Review of Systems   Constitutional: Positive for fatigue (improved). Negative for unexpected weight change.        Hot flashes     HENT: Negative.    Eyes: Negative.  Negative for visual disturbance.   Respiratory: Negative.  Negative for cough and shortness of breath.    Cardiovascular: Negative.  Negative for chest pain.   Gastrointestinal: Negative.  Negative for abdominal pain, constipation, diarrhea, nausea and vomiting.   Endocrine: Negative.    Genitourinary: Negative.  Negative for difficulty urinating.   Musculoskeletal: Negative for arthralgias and back pain.   Skin: Negative.  Negative for rash.   Allergic/Immunologic: Negative.    Neurological: Positive for numbness (feet, hands, stable). Negative for dizziness, weakness and headaches.   Hematological: Negative.  Negative for adenopathy. Psychiatric/Behavioral: Positive for dysphoric mood (increased depression) and sleep disturbance (improved with ambien). The patient is nervous/anxious (increased anxiety).        PMH: Negative  FAMILY HX: No family history of breast cancer.  Paternal grandmother (fallopian ca- genetic test negative). Maternal grandmother and grandfather with lung cancer. Maternal uncle throat cancer (age 56).  GYN: Menarche age 70. G2P2, ovaries/uterus intact.  On birth control x10 years, stopped at diagnosis  SOCIAL:  Hair dresser, married, lives in [place]    Objective:         ? amitriptyline (ELAVIL) 25 mg tablet Take 25 mg by mouth at bedtime daily. Indications: Migraine Prevention   ? diazePAM (VALIUM) 5 mg tablet Take one tablet by mouth every 8 hours as needed for Anxiety. Indications: muscle spasm   ? gabapentin (NEURONTIN) 100 mg capsule take 2 capsules BY MOUTH EVERY 8 HOURS   ? gabapentin (NEURONTIN) 300 mg capsule TAKE 3 CAPSULES BY MOUTH AT BEDTIME DAILY.   ? goserelin (ZOLADEX) 3.6 mg implant syringe Inject 3.6 mg under the skin once.   ? lisinopriL (ZESTRIL) 20 mg tablet Take 20 mg by mouth daily.   ? ondansetron (ZOFRAN) 4 mg tablet Take one tablet by mouth every 8 hours as needed for Nausea or Vomiting.   ? oxyCODONE (ROXICODONE) 5 mg tablet Take one tablet by mouth every 4 hours as needed for Pain (Take 1-2 tabs every 6 hours for pain rated 5/10 or higher; No more than 6 tabs in 24-hour period.)   ? oxyCODONE (ROXICODONE) 5 mg tablet Take one tablet to two tablets by mouth every 4 hours as needed for Pain   ? senna (SENOKOT) 8.6 mg tablet Take two tablets by mouth twice daily as needed for Constipation (Please take while taking oxycodone.).   ? solifenacin(+) (VESICARE) 10 mg tablet Take 10 mg by mouth daily.   ? SUMAtriptan succinate (IMITREX) 50 mg tablet Take 50 mg by mouth as Needed for Migraine symptoms. Dose may be repeated in 2 hours if needed. Max of 2 tablets in 24 hours. ? tamoxifen (NOLVADEX) 20 mg tablet TAKE ONE TABLET BY MOUTH ONCE DAILY   ? venlafaxine XR (EFFEXOR XR) 37.5 mg capsule TAKE 1 CAPSULE BY MOUTH DAILY. TAKE WITH FOOD.   ? venlafaxine XR (EFFEXOR XR) 75 mg capsule TAKE 1 CAPSULE BY MOUTH DAILY. TAKE WITH FOOD.   ? zolpidem (AMBIEN) 10 mg tablet take 1 tablet BY MOUTH at bedtime     Vitals:    05/02/19 1356   BP: 107/54   Pulse: 88   Resp: 16   Temp: 36.6 ?C (97.8 ?F)   SpO2: 100%       Body mass index is 32.08 kg/m?Marland Kitchen     Pain Score: Zero     Pain Addressed:  N/A    Patient Evaluated for a Clinical Trial: Patient not eligible for a treatment trial (including not needing  treatment, needs palliative care, in remission).     [place] Cooperative Oncology Group performance status is 0, Fully active, able to carry on all pre-disease performance without restriction.Marland Kitchen     Physical Exam  Vitals signs reviewed.   Constitutional:       General: She is not in acute distress.     Appearance: She is well-developed.   HENT:      Head: Normocephalic and atraumatic.   Eyes:      General: No scleral icterus.        Right eye: No discharge.         Left eye: No discharge.      Conjunctiva/sclera: Conjunctivae normal.   Neck:      Musculoskeletal: Normal range of motion.   Cardiovascular:      Rate and Rhythm: Normal rate and regular rhythm.      Heart sounds: Normal heart sounds.   Pulmonary:      Effort: Pulmonary effort is normal. No respiratory distress.      Breath sounds: Normal breath sounds. No wheezing.   Chest:      Chest wall: No tenderness.       Abdominal:      General: There is no distension.      Palpations: Abdomen is soft.   Musculoskeletal: Normal range of motion.         General: No tenderness.   Lymphadenopathy:      Cervical: No cervical adenopathy.      Upper Body:      Right upper body: No supraclavicular adenopathy.      Left upper body: No supraclavicular adenopathy.   Skin:     General: Skin is warm and dry.      Coloration: Skin is not jaundiced or pale. Findings: No erythema or rash.   Neurological:      Mental Status: She is alert and oriented to person, place, and time.      Cranial Nerves: No cranial nerve deficit.      Sensory: No sensory deficit.      Motor: No weakness.      Coordination: Coordination normal.   Psychiatric:         Behavior: Behavior normal.         Thought Content: Thought content normal.         Judgment: Judgment normal.             Labs:  E2 05/14/15: 23  E2 07/02/15: 41  E2 10/01/15: 23  E2 12/31/15: 34  E2 07/21/16: 29  E2 11/17/16: <20  E2 05/28/18: <15.0    Bone Health:  BMD 11/02/17: NORMAL            Assessment and Plan:  1.  34 y.o. pre-menopausal female with right, multicentric grade II IDC (ER 85%, PR 98%, Her2 1+, Hi-67: 6%).  S/P bilateral mastectomy/R SLNB, pT2N0.  She was found to have 3 areas of malignancy: Tumor #1 at 9:00 measured 2.3cm (ER 90%, PR 100%, Her2 2+, FISH: 1.5 with avg #Her2 signals 4.01 (negative), Ki-67: 6%).  Tumor #2 at 6:00 measured 6 mm (ER 96%, PR 100%, Her2 1+, Ki-67 7%). Tumor #3 in the Left outer quadrant measured 2mm (ER and PR 100%, HER2 IHC 2+, Ki-67 17%, HER2 FISH negative); 4 negative SLN. Completed adjuvant AC-Taxol 02/2015.  Started adjuvant tamoxifen in 04/2015 and OFS in 12/2015 for rising E2.  Continue with tamoxifen as menopausal symptoms are still present.Plan for OFS for 5  years.     2. Long term plan includes 5-10 years of endocrine therapy.  E2 prior to starting tamoxifen was 23. Level from 09/2015 was 23 and was 220 on 12/31/15. At that time, we started Zoladex for OFS. While hot flashes are still present, she does want to continue with monthly injections.     3. Hot flashes:  Effexor at 112.5 mg PO QDAY and Gabapentin 300 qam and 600 mg PO QHS.      4. 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     5. Genetic testing. [employer] 26 gene panel testing was negative.    6. BMD normal for expected age (T score not provided). Repeat 10/2019. 7. Increasing anxiety and depression.  No improvements with lexapro previously and wellbutrin can decrease the efficacy of tamoxifen. Will refer to psychiatry for management.      RTC in 6 months with BMD. Continue with monthly zoladex       Brooke Pace Heldstab, APRN-NP      I personally interviewed and examined the patient. I have reviewed the history, physical, impression and plan outlined by the nurse practitioner.     The patient presents with Stage 2  breast cancer.     On examination there is:   This is a  38  female in no acute distress, well developed.   HEENT:  No icterus   Neck: No JVD, supple.   Adenopathy:  None.  CV:  RR  Breasts:  As above.   Abdomen: Soft, non-distended   Skin: No rash.   Back:  No tenderness   Extremities: No edema.   Neuro: A&O x3    My impression is:   Stage II breast cancer    My plan is:  Continue tamoxifen and OFS  Continue Effexor and Gabapentin   Refer to Psychiatry    Osborn Coho, MD

## 2019-05-31 ENCOUNTER — Encounter: Admit: 2019-05-31 | Discharge: 2019-05-31 | Payer: BC Managed Care – PPO

## 2019-05-31 DIAGNOSIS — C50411 Malignant neoplasm of upper-outer quadrant of right female breast: Secondary | ICD-10-CM

## 2019-05-31 MED ORDER — GOSERELIN 3.6 MG SC IMPL
3.6 mg | Freq: Once | SUBCUTANEOUS | 0 refills | Status: CP
Start: 2019-05-31 — End: ?
  Administered 2019-05-31: 22:00:00 3.6 mg via SUBCUTANEOUS

## 2019-05-31 MED ORDER — LIDOCAINE (PF) 10 MG/ML (1 %) IJ SOLN
2 mL | Freq: Once | SUBCUTANEOUS | 0 refills | Status: CP
Start: 2019-05-31 — End: ?
  Administered 2019-05-31: 22:00:00 2 mL via SUBCUTANEOUS

## 2019-05-31 NOTE — Progress Notes
Patient received Lidocaine and Zoladex and tolerated without difficulty.  No pertinent changes since last assessment.

## 2019-06-03 ENCOUNTER — Encounter: Admit: 2019-06-03 | Discharge: 2019-06-03 | Payer: BC Managed Care – PPO

## 2019-06-15 ENCOUNTER — Encounter: Admit: 2019-06-15 | Discharge: 2019-06-15 | Payer: BC Managed Care – PPO

## 2019-06-17 ENCOUNTER — Encounter: Admit: 2019-06-17 | Discharge: 2019-06-17 | Payer: BC Managed Care – PPO

## 2019-06-17 MED ORDER — GABAPENTIN 100 MG PO CAP
ORAL_CAPSULE | Freq: Three times a day (TID) | 3 refills | Status: DC
Start: 2019-06-17 — End: 2019-10-08

## 2019-06-17 MED ORDER — GABAPENTIN 300 MG PO CAP
900 mg | ORAL_CAPSULE | Freq: Every evening | ORAL | 3 refills | 30.00000 days | Status: AC
Start: 2019-06-17 — End: ?

## 2019-06-24 ENCOUNTER — Encounter: Admit: 2019-06-24 | Discharge: 2019-06-24 | Payer: BC Managed Care – PPO

## 2019-06-27 ENCOUNTER — Encounter: Admit: 2019-06-27 | Discharge: 2019-06-27 | Payer: BC Managed Care – PPO

## 2019-06-28 ENCOUNTER — Encounter: Admit: 2019-06-28 | Discharge: 2019-06-28 | Payer: BC Managed Care – PPO

## 2019-06-28 DIAGNOSIS — C50411 Malignant neoplasm of upper-outer quadrant of right female breast: Secondary | ICD-10-CM

## 2019-06-28 MED ORDER — GOSERELIN 3.6 MG SC IMPL
3.6 mg | Freq: Once | SUBCUTANEOUS | 0 refills | Status: CP
Start: 2019-06-28 — End: ?
  Administered 2019-06-28: 20:00:00 3.6 mg via SUBCUTANEOUS

## 2019-06-28 MED ORDER — LIDOCAINE (PF) 10 MG/ML (1 %) IJ SOLN
2 mL | Freq: Once | SUBCUTANEOUS | 0 refills | Status: CP
Start: 2019-06-28 — End: ?
  Administered 2019-06-28: 20:00:00 2 mL via SUBCUTANEOUS

## 2019-06-28 NOTE — Progress Notes
Patient arrived to [facility] for injection. Lidocaine adminsitered before Zoladex per patient request. Zoladex given in RLQ of abdomen and tolerated without difficulty.  No pertinent changes since last assessment. All questions and concerns addressed. Patient left LL3 ambulatory in stable condition.

## 2019-07-01 ENCOUNTER — Encounter: Admit: 2019-07-01 | Discharge: 2019-07-01 | Payer: BC Managed Care – PPO

## 2019-07-01 ENCOUNTER — Ambulatory Visit: Admit: 2019-07-01 | Discharge: 2019-07-01 | Payer: BC Managed Care – PPO

## 2019-07-01 DIAGNOSIS — K219 Gastro-esophageal reflux disease without esophagitis: Secondary | ICD-10-CM

## 2019-07-01 DIAGNOSIS — K929 Disease of digestive system, unspecified: Secondary | ICD-10-CM

## 2019-07-01 DIAGNOSIS — R112 Nausea with vomiting, unspecified: Secondary | ICD-10-CM

## 2019-07-01 DIAGNOSIS — Z421 Encounter for breast reconstruction following mastectomy: Secondary | ICD-10-CM

## 2019-07-01 DIAGNOSIS — F419 Anxiety disorder, unspecified: Secondary | ICD-10-CM

## 2019-07-01 DIAGNOSIS — C50911 Malignant neoplasm of unspecified site of right female breast: Secondary | ICD-10-CM

## 2019-07-01 DIAGNOSIS — Z9221 Personal history of antineoplastic chemotherapy: Secondary | ICD-10-CM

## 2019-07-01 DIAGNOSIS — Z789 Other specified health status: Secondary | ICD-10-CM

## 2019-07-01 NOTE — Progress Notes
Date of Service: 07/01/2019                    History of Present Illness  Debra Fleming is a 34 y.o. female.  03/11/2020 GRAFTING AUTOLOGOUS FAT BY LIPOSUCTION TO BREASTS LEFT 165 CC, RIGHT 168 CC TOTAL 333 CC (Bilateral)  GRAFTING AUTOLOGOUS FAT BY LIPOSUCTION TO BREASTS LEFT 165 CC, RIGHT 168 CC TOTAL 333 CC (Bilateral)  INSERTION SIENTRA 10721-540MP BREAST PROSTHESIS IN RECONSTRUCTION - IMMEDIATE (Bilateral)  Doing well  Scar still feels a little tight on the right  With movement feels like it is pulling   Pleased with the current size     Review of Systems   Constitutional: Negative.    HENT: Negative.    Eyes: Negative.    Respiratory: Negative.    Cardiovascular: Negative.    Gastrointestinal: Negative.    Endocrine: Negative.    Genitourinary: Negative.    Musculoskeletal: Negative.    Skin: Negative.    Allergic/Immunologic: Negative.    Neurological: Negative.    Hematological: Negative.    Psychiatric/Behavioral: Negative.        Medical History:   Diagnosis Date   ? Acid reflux     chemo induced   ? Anxiety disorder    ? Gastrointestinal disorder    ? History of chemotherapy 2017   ? Limb alert care status 6/16    right limb alert   ? 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 PONV (postoperative nausea and vomiting)         Surgical History:   Procedure Laterality Date   ? LIPOMA RESECTION  2004    back   ? BREAST BIOPSY Right 09/11/14   ? BILATERAL NIPPLE-SPARING MASTECTOMIES, RIGHT SENTINEL LYMPH NODE BIOPSY Bilateral 10/22/2014    Performed by Cordelia Poche, MD at [facility]   ? INSERTION TISSUE EXPANDER BREAST Bilateral 10/22/2014    Performed by Corliss Skains, MD at [facility] OR   ? INSERTION VENOUS ACCESS PORT Left 10/22/2014    Performed by Simonne Martinet, MD at [facility] OR   ? TUNNELED VENOUS PORT REMOVAL  2017   ? RECONSTRUCTION BREAST/ BILATERAL TISSUE EXPANDER REMOVAL/ SILICONE IMPLANT PLACEMENT, PORT REMOVAL Bilateral 05/04/2015    Performed by Corliss Skains, MD at [facility] OR ? BILATERAL CAPSULOTOMIES Bilateral 05/04/2015    Performed by Corliss Skains, MD at [facility] OR   ? INSERTION FAT GRAFT BREAST FROM ABDOMEN Bilateral 10/14/2015    Performed by Corliss Skains, MD at [facility] OR   ? LEFT BREAST MOUND REVISION WITH FAT GRAFTING; RIGHT BREAST RECONSTRUCTION WITH FAT GRAFTING Bilateral 04/01/2016    Performed by Corliss Skains, MD at [facility] OR   ? Bilateral fat grafting from abdomen and thighs Bilateral 04/01/2016    Performed by Corliss Skains, MD at [facility] OR   ? BILATERAL BREAST REVISION WITH CASULOTOMY AND IMPLANT EXCHANGE Bilateral 10/12/2016    Performed by Corliss Skains, MD at [facility] OR   ? LIPOSUCTION TO AXILLARY FOLDS Bilateral 10/12/2016    Performed by Corliss Skains, MD at [facility] OR   ? TISSUE GRAFT - Bilateral breast fat grafting from abdominal donor site Bilateral 07/26/2017    Performed by Corliss Skains, MD at [facility] OR   ? PERIPROSTHETIC CAPSULECTOMY BREAST-Left lateral capsulectomy with ADM support Left 07/26/2017    Performed by Loney Laurence, Pearletha Furl, MD at [facility] OR   ? REVISION  RECONSTRUCTED BREAST Right 07/26/2017    Performed by Corliss Skains, MD at [facility] OR   ? GRAFTING AUTOLOGOUS FAT BY LIPOSUCTION TO BREASTS LEFT 165 CC, RIGHT 168 CC TOTAL 333 CC Bilateral 03/15/2019    Performed by Corliss Skains, MD at [facility] OR   ? GRAFTING AUTOLOGOUS FAT BY LIPOSUCTION TO BREASTS LEFT 165 CC, RIGHT 168 CC TOTAL 333 CC Bilateral 03/15/2019    Performed by Corliss Skains, MD at [facility] OR   ? INSERTION SIENTRA 91478-295AO BREAST PROSTHESIS IN RECONSTRUCTION - IMMEDIATE Bilateral 03/15/2019    Performed by Corliss Skains, MD at [facility] OR        Family History   Problem Relation Age of Onset   ? High Cholesterol Mother    ? Arthritis-rheumatoid Mother    ? Arthritis-rheumatoid Maternal Aunt    ? Cancer-Lung Maternal Grandmother    ? Arthritis-rheumatoid Maternal Grandmother    ? Cancer-Lung Maternal Grandfather    ? Cancer-Uterine Paternal Grandmother ? Arthritis-rheumatoid Paternal Grandmother    ? Cancer-Prostate Paternal Grandfather    ? Diabetes Paternal Grandfather         Social History     Socioeconomic History   ? Marital status: Married     Spouse name: Not on file   ? Number of children: Not on file   ? Years of education: Not on file   ? Highest education level: Not on file   Occupational History   ? Not on file   Tobacco Use   ? Smoking status: Former Smoker     Packs/day: 0.25     Years: 4.00     Pack years: 1.00     Types: Cigarettes     Quit date: 08/10/2005     Years since quitting: 13.8   ? Smokeless tobacco: Never Used   Substance and Sexual Activity   ? Alcohol use: Not Currently     Alcohol/week: 0.0 standard drinks     Comment: rarely   ? Drug use: No   ? Sexual activity: Yes     Partners: Male   Other Topics Concern   ? Not on file   Social History Narrative   ? Not on file          Objective:         ? diazePAM (VALIUM) 5 mg tablet Take one tablet by mouth every 8 hours as needed for Anxiety. Indications: muscle spasm   ? gabapentin (NEURONTIN) 100 mg capsule take 2 capsules BY MOUTH EVERY 8 HOURS   ? gabapentin (NEURONTIN) 300 mg capsule TAKE 3 CAPSULES BY MOUTH AT BEDTIME DAILY.   ? goserelin (ZOLADEX) 3.6 mg implant syringe Inject 3.6 mg under the skin once.   ? lisinopriL (ZESTRIL) 20 mg tablet Take 20 mg by mouth daily.   ? ondansetron (ZOFRAN) 4 mg tablet Take one tablet by mouth every 8 hours as needed for Nausea or Vomiting.   ? oxyCODONE (ROXICODONE) 5 mg tablet Take one tablet by mouth every 4 hours as needed for Pain (Take 1-2 tabs every 6 hours for pain rated 5/10 or higher; No more than 6 tabs in 24-hour period.)   ? oxyCODONE (ROXICODONE) 5 mg tablet Take one tablet to two tablets by mouth every 4 hours as needed for Pain   ? senna (SENOKOT) 8.6 mg tablet Take two tablets by mouth twice daily as needed for Constipation (Please take while taking oxycodone.).   ? solifenacin(+) (VESICARE) 10 mg tablet  Take 10 mg by mouth daily. ? SUMAtriptan succinate (IMITREX) 50 mg tablet Take 50 mg by mouth as Needed for Migraine symptoms. Dose may be repeated in 2 hours if needed. Max of 2 tablets in 24 hours.   ? tamoxifen (NOLVADEX) 20 mg tablet TAKE ONE TABLET BY MOUTH ONCE DAILY   ? topiramate (TOPAMAX) 50 mg tablet Take 50 mg by mouth every 12 hours.   ? venlafaxine XR (EFFEXOR XR) 37.5 mg capsule TAKE 1 CAPSULE BY MOUTH DAILY. TAKE WITH FOOD.   ? venlafaxine XR (EFFEXOR XR) 75 mg capsule TAKE 1 CAPSULE BY MOUTH DAILY. TAKE WITH FOOD.   ? zolpidem (AMBIEN) 10 mg tablet take 1 tablet BY MOUTH at bedtime     Vitals:    07/01/19 1536   BP: 108/74   BP Source: Arm, Left Upper   Patient Position: Sitting   Pulse: 82   Temp: 36.7 ?C (98.1 ?F)   TempSrc: Skin   SpO2: 100%   Weight: 91.5 kg (201 lb 12.8 oz)   Height: 168.9 cm (66.5)   PainSc: Zero     Body mass index is 32.08 kg/m?Marland Kitchen  Body surface area is 2.07 meters squared.          Physical Exam  Vitals signs and nursing note reviewed.   Chest:      Breasts:         Right: Absent.         Left: No inverted nipple.      Comments: Implant based reconstruction  Likes current size  Right lateral areola scar tight with areolar deformity    Abdominal:      Comments: Some abdomen and thigh fullness   Neurological:      Mental Status: She is alert and oriented to person, place, and time.   Psychiatric:         Mood and Affect: Mood normal.         Behavior: Behavior normal.           Assessment and Plan:  Discussed right breast z plasty and bilateral breast fat grafting  2 hr GA DS  Aware of recovery and expected  Noted rash from adhesive on the OR drapes, will avoid any contact with skin.

## 2019-07-31 ENCOUNTER — Encounter: Admit: 2019-07-31 | Discharge: 2019-07-31 | Payer: BC Managed Care – PPO

## 2019-08-02 ENCOUNTER — Encounter: Admit: 2019-08-02 | Discharge: 2019-08-02 | Payer: BC Managed Care – PPO

## 2019-08-02 DIAGNOSIS — C50411 Malignant neoplasm of upper-outer quadrant of right female breast: Secondary | ICD-10-CM

## 2019-08-02 MED ORDER — VENLAFAXINE 37.5 MG PO CP24
37.5 mg | ORAL_CAPSULE | Freq: Every day | ORAL | 5 refills | Status: AC
Start: 2019-08-02 — End: ?

## 2019-08-02 MED ORDER — GOSERELIN 3.6 MG SC IMPL
3.6 mg | Freq: Once | SUBCUTANEOUS | 0 refills | Status: CP
Start: 2019-08-02 — End: ?
  Administered 2019-08-02: 21:00:00 3.6 mg via SUBCUTANEOUS

## 2019-08-02 MED ORDER — LIDOCAINE (PF) 10 MG/ML (1 %) IJ SOLN
2 mL | Freq: Once | SUBCUTANEOUS | 0 refills | Status: CP
Start: 2019-08-02 — End: ?
  Administered 2019-08-02: 21:00:00 2 mL via SUBCUTANEOUS

## 2019-08-02 NOTE — Progress Notes
Patient received Zoladex injection and tolerated without difficulty  No pertinent changes since last assessment.

## 2019-08-19 ENCOUNTER — Encounter: Admit: 2019-08-19 | Discharge: 2019-08-19 | Payer: BC Managed Care – PPO

## 2019-08-19 NOTE — Telephone Encounter
Spoke with pt advised of rescheduled appointment

## 2019-08-27 ENCOUNTER — Encounter: Admit: 2019-08-27 | Discharge: 2019-08-27 | Payer: BC Managed Care – PPO

## 2019-09-02 ENCOUNTER — Encounter: Admit: 2019-09-02 | Discharge: 2019-09-02 | Payer: BC Managed Care – PPO

## 2019-09-02 DIAGNOSIS — C50411 Malignant neoplasm of upper-outer quadrant of right female breast: Secondary | ICD-10-CM

## 2019-09-02 MED ORDER — LIDOCAINE (PF) 10 MG/ML (1 %) IJ SOLN
2 mL | Freq: Once | SUBCUTANEOUS | 0 refills | Status: CP
Start: 2019-09-02 — End: ?
  Administered 2019-09-02: 21:00:00 2 mL via SUBCUTANEOUS

## 2019-09-02 MED ORDER — GOSERELIN 3.6 MG SC IMPL
3.6 mg | Freq: Once | SUBCUTANEOUS | 0 refills | Status: CP
Start: 2019-09-02 — End: ?
  Administered 2019-09-02: 21:00:00 3.6 mg via SUBCUTANEOUS

## 2019-09-02 NOTE — Progress Notes
Patient received Zoladex and tolerated without difficulty.  No pertinent changes since last assessment.

## 2019-09-07 ENCOUNTER — Encounter: Admit: 2019-09-07 | Discharge: 2019-09-07 | Payer: BC Managed Care – PPO

## 2019-09-09 ENCOUNTER — Encounter: Admit: 2019-09-09 | Discharge: 2019-09-09 | Payer: BC Managed Care – PPO

## 2019-09-09 MED ORDER — ZOLPIDEM 10 MG PO TAB
ORAL_TABLET | Freq: Every evening | 4 refills
Start: 2019-09-09 — End: ?

## 2019-09-09 MED ORDER — ZOLPIDEM 10 MG PO TAB
ORAL_TABLET | Freq: Every evening | ORAL | 4 refills | 30.00000 days | Status: AC
Start: 2019-09-09 — End: ?

## 2019-09-10 ENCOUNTER — Encounter: Admit: 2019-09-10 | Discharge: 2019-09-10 | Payer: BC Managed Care – PPO

## 2019-09-17 ENCOUNTER — Encounter: Admit: 2019-09-17 | Discharge: 2019-09-17 | Payer: BC Managed Care – PPO

## 2019-09-17 NOTE — Telephone Encounter
STPT and rescheduled appt with [facility].

## 2019-10-01 ENCOUNTER — Encounter: Admit: 2019-10-01 | Discharge: 2019-10-01 | Payer: BC Managed Care – PPO

## 2019-10-01 DIAGNOSIS — C50411 Malignant neoplasm of upper-outer quadrant of right female breast: Secondary | ICD-10-CM

## 2019-10-01 MED ORDER — GOSERELIN 3.6 MG SC IMPL
3.6 mg | Freq: Once | SUBCUTANEOUS | 0 refills | Status: CP
Start: 2019-10-01 — End: ?
  Administered 2019-10-01: 14:00:00 3.6 mg via SUBCUTANEOUS

## 2019-10-01 MED ORDER — LIDOCAINE (PF) 10 MG/ML (1 %) IJ SOLN
2 mL | Freq: Once | SUBCUTANEOUS | 0 refills | Status: CP
Start: 2019-10-01 — End: ?
  Administered 2019-10-01: 14:00:00 2 mL via SUBCUTANEOUS

## 2019-10-01 NOTE — Progress Notes
Patient arrived to [facility] treatment for Zoladex. VS stable. Patient received and tolerated without difficulty. Schedule reviewed. Pt left clinic ambulatory without further questions or concerns.

## 2019-10-08 ENCOUNTER — Encounter: Admit: 2019-10-08 | Discharge: 2019-10-08 | Payer: BC Managed Care – PPO

## 2019-10-08 MED ORDER — GABAPENTIN 100 MG PO CAP
ORAL_CAPSULE | Freq: Three times a day (TID) | 0 refills | Status: DC
Start: 2019-10-08 — End: 2019-11-07

## 2019-10-15 ENCOUNTER — Encounter: Admit: 2019-10-15 | Discharge: 2019-10-15 | Payer: BC Managed Care – PPO

## 2019-10-15 DIAGNOSIS — C50411 Malignant neoplasm of upper-outer quadrant of right female breast: Secondary | ICD-10-CM

## 2019-10-31 ENCOUNTER — Encounter: Admit: 2019-10-31 | Discharge: 2019-10-31 | Payer: BC Managed Care – PPO

## 2019-10-31 DIAGNOSIS — K929 Disease of digestive system, unspecified: Secondary | ICD-10-CM

## 2019-10-31 DIAGNOSIS — Z9221 Personal history of antineoplastic chemotherapy: Secondary | ICD-10-CM

## 2019-10-31 DIAGNOSIS — R112 Nausea with vomiting, unspecified: Secondary | ICD-10-CM

## 2019-10-31 DIAGNOSIS — C50411 Malignant neoplasm of upper-outer quadrant of right female breast: Secondary | ICD-10-CM

## 2019-10-31 DIAGNOSIS — Z79810 Long term (current) use of selective estrogen receptor modulators (SERMs): Secondary | ICD-10-CM

## 2019-10-31 DIAGNOSIS — C50911 Malignant neoplasm of unspecified site of right female breast: Secondary | ICD-10-CM

## 2019-10-31 DIAGNOSIS — Z780 Asymptomatic menopausal state: Secondary | ICD-10-CM

## 2019-10-31 DIAGNOSIS — Z789 Other specified health status: Secondary | ICD-10-CM

## 2019-10-31 DIAGNOSIS — K219 Gastro-esophageal reflux disease without esophagitis: Secondary | ICD-10-CM

## 2019-10-31 DIAGNOSIS — F419 Anxiety disorder, unspecified: Secondary | ICD-10-CM

## 2019-10-31 DIAGNOSIS — Z9189 Other specified personal risk factors, not elsewhere classified: Secondary | ICD-10-CM

## 2019-10-31 LAB — ESTRADIOL (E2): Lab: <15 pg/mL

## 2019-10-31 MED ORDER — GOSERELIN 3.6 MG SC IMPL
3.6 mg | Freq: Once | SUBCUTANEOUS | 0 refills | Status: CP
Start: 2019-10-31 — End: ?
  Administered 2019-10-31: 19:00:00 3.6 mg via SUBCUTANEOUS

## 2019-10-31 MED ORDER — LIDOCAINE (PF) 10 MG/ML (1 %) IJ SOLN
2 mL | Freq: Once | SUBCUTANEOUS | 0 refills | Status: CP
Start: 2019-10-31 — End: ?
  Administered 2019-10-31: 19:00:00 2 mL via SUBCUTANEOUS

## 2019-10-31 NOTE — Patient Instructions
Team Members:  Dr. Priyanka Sharma, MD  Jaimie Heldstab, APRN  Lisa Her, RN  Leelynn Whetsel, RN    Contact information:   Office phone: [phone]  Office fax: [phone]  After hours phone: [phone]    Address:  [address], [address]  [place], [place] [address]

## 2019-10-31 NOTE — Progress Notes
Patient received ZOLADEX-D1C48 and tolerated without difficulty.  No pertinent changes since last assessment.

## 2019-10-31 NOTE — Progress Notes
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  Debra Fleming felt a right breast lump in April 2016 and when it started to increase in size, she went to her PCP. Bilateral diagnostic mammogram 09/08/14 ([facility]) revealed a 2.3 cm irregular mass 9:00, 7 cm FTN in the right breast.  There were a few other focal symmetries in the right breast at 10:00, 12:00, and 6:00. In the left breast there was a 7 mm asymmetry in the lower inner quadrant, 7 cm FTN. Bilateral breast ultrasound 09/08/14 ([facility]) revealed in the right breast a 2.3 cm irregular hypoechoic mass at 9:00. There was also a 1.5 cm mass at 10:00, 7 cm FTN. Additional suspicious findings were noted at 6:00, 7 cm FTN measuring 7 mm and 12:00, 4 cm FTN measuring 5 mm. The right axilla appeared normal. In the left breast there was a 7 mm hypoechoic cluster of cysts with no internal vascularity. Right breast biopsy was recommended. A 6 month follow up left breast ultrasound was recommended (BI-RADS 5).      Imaging was repeated at [facility] 09/11/14.  Bilateral breast US 09/11/14 revealed an irregular right breast mass measuring up to 3.5cm at 9:00, 7cm FTN.  There was also an 8mm area at 10:00, 5cm FTN, a 4mm area at 12:00 4cm FTN, 7mm focus at 6:00 4cm FTN, 1cm area at 12:00 2cm FTN, and at 12:30 subareolar ,measuring 2 cm x 0.5 cm.  The left breast revealed a 9x4x40mm mass at 9:30, 2cm FTN.     She had three core needle biopsies 09/11/14.  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  Right breast biopsy at 12:00, 2cm FTN revealed usual ductal hyperplasia, no evidence of malignancy.  Right breast biopsy at 9:00, 7cm FTN revealed grade II IDC (ER 85%, PR 98%, Her2 1+, Hi-67: 6%).    She had a bilateral mastectomy on 10/22/14 which revealed multicentric right, grade II, IDC. Tumor #1 at 9:00 measured 2.3cm (ER 90%, PR 100%, Her2 2+, FISH: 1.5 with avg #Her2 signals 4.01 (negative), Ki-67: 6%).  Tumor #2 at 6:00 measured 6 mm (ER 96%, PR 100%, Her2 1+, Ki-67 7%).  Tumor #3 (found incidentally) in the Left outer quadrant measured 2mm (ER and PR 100%, HER2 IHC 2+, Ki-67 17%, HER2 FISH negative); 4 negative SLN.  There was a distance of 2.5cm between the first and second mass.  Distance between second and the third lesion are unknown.     She received 4 cycles of adjuvant AC (8/18-10/8/16) followed by Taxol 10/24-12/29/16 - last dose was dose dense.    PRESENT THERAPY: Tamoxifen (started 06/2015), zoladex 12/2015 (rise in E2)    CHANGES SINCE LAST INTERVENTION: Debra Fleming returns to clinic for follow-up. She continues on monthly zoladex and tamoxifen daily. Hot flashes have improved some.  She was started on topamax for depression which she reports has helped significantly.  She wants to continue zoladex at this time, but reports a $500/month copay with the injections.            Review of Systems   Constitutional: Positive for fatigue (improved). Negative for unexpected weight change.  Hot flashes     HENT: Negative.    Eyes: Negative.  Negative for visual disturbance.   Respiratory: Negative.  Negative for cough and shortness of breath.    Cardiovascular: Negative.  Negative for chest pain.   Gastrointestinal: Negative.  Negative for abdominal pain, constipation, diarrhea, nausea and vomiting.   Endocrine: Negative.    Genitourinary: Negative.  Negative for difficulty urinating.   Musculoskeletal: Negative for arthralgias and back pain.   Skin: Negative.  Negative for rash.   Allergic/Immunologic: Negative.    Neurological: Positive for numbness (feet, hands, stable). Negative for dizziness, weakness and headaches.   Hematological: Negative.  Negative for adenopathy.   Psychiatric/Behavioral: Positive for dysphoric mood (improved) and sleep disturbance (improved with ambien). The patient is nervous/anxious (improved).        PMH: Negative  FAMILY HX: No family history of breast cancer.  Paternal grandmother (fallopian ca- genetic test negative). Maternal grandmother and grandfather with lung cancer. Maternal uncle throat cancer (age 73).  GYN: Menarche age 14. G2P2, ovaries/uterus intact.  On birth control x10 years, stopped at diagnosis  SOCIAL:  Hair dresser, married, lives in [place]    Objective:         ? diazePAM (VALIUM) 5 mg tablet Take one tablet by mouth every 8 hours as needed for Anxiety. Indications: muscle spasm   ? gabapentin (NEURONTIN) 100 mg capsule take 2 capsules BY MOUTH EVERY 8 HOURS   ? gabapentin (NEURONTIN) 300 mg capsule TAKE 3 CAPSULES BY MOUTH AT BEDTIME DAILY.   ? goserelin (ZOLADEX) 3.6 mg implant syringe Inject 3.6 mg under the skin once.   ? lisinopriL (ZESTRIL) 20 mg tablet Take 20 mg by mouth daily.   ? ondansetron (ZOFRAN) 4 mg tablet Take one tablet by mouth every 8 hours as needed for Nausea or Vomiting.   ? oxyCODONE (ROXICODONE) 5 mg tablet Take one tablet by mouth every 4 hours as needed for Pain (Take 1-2 tabs every 6 hours for pain rated 5/10 or higher; No more than 6 tabs in 24-hour period.)   ? oxyCODONE (ROXICODONE) 5 mg tablet Take one tablet to two tablets by mouth every 4 hours as needed for Pain   ? senna (SENOKOT) 8.6 mg tablet Take two tablets by mouth twice daily as needed for Constipation (Please take while taking oxycodone.).   ? solifenacin(+) (VESICARE) 10 mg tablet Take 10 mg by mouth daily.   ? SUMAtriptan succinate (IMITREX) 50 mg tablet Take 50 mg by mouth as Needed for Migraine symptoms. Dose may be repeated in 2 hours if needed. Max of 2 tablets in 24 hours.   ? tamoxifen (NOLVADEX) 20 mg tablet TAKE ONE TABLET BY MOUTH ONCE DAILY   ? topiramate (TOPAMAX) 50 mg tablet Take 50 mg by mouth every 12 hours.   ? venlafaxine XR (EFFEXOR XR) 37.5 mg capsule TAKE 1 CAPSULE BY MOUTH DAILY. TAKE WITH FOOD.   ? venlafaxine XR (EFFEXOR XR) 75 mg capsule TAKE 1 CAPSULE BY MOUTH DAILY. TAKE WITH FOOD.   ? zolpidem (AMBIEN) 10 mg tablet take 1 tablet BY MOUTH at bedtime     Vitals:    10/31/19 1247   BP: 114/66   Pulse: 81   Resp: 16   Temp: 36.8 ?C (98.2 ?F)   SpO2: 99%       Body mass index is 31.8 kg/m?Marland Kitchen     Pain Score: Zero     Pain Addressed:  N/A    Patient Evaluated for  a Clinical Trial: Patient not eligible for a treatment trial (including not needing treatment, needs palliative care, in remission).     [place] Cooperative Oncology Group performance status is 0, Fully active, able to carry on all pre-disease performance without restriction.Marland Kitchen     Physical Exam  Vitals reviewed.   Constitutional:       General: She is not in acute distress.     Appearance: She is well-developed.   HENT:      Head: Normocephalic and atraumatic.   Eyes:      General: No scleral icterus.        Right eye: No discharge.         Left eye: No discharge.      Conjunctiva/sclera: Conjunctivae normal.   Cardiovascular:      Rate and Rhythm: Normal rate and regular rhythm.      Heart sounds: Normal heart sounds.   Pulmonary:      Effort: Pulmonary effort is normal. No respiratory distress.      Breath sounds: Normal breath sounds. No wheezing.   Chest:      Chest wall: No tenderness.       Abdominal:      General: There is no distension.   Musculoskeletal:         General: No tenderness. Normal range of motion.      Cervical back: Normal range of motion.   Lymphadenopathy:      Cervical: No cervical adenopathy.      Upper Body:      Right upper body: No supraclavicular adenopathy.      Left upper body: No supraclavicular adenopathy.   Skin:     General: Skin is warm and dry.      Coloration: Skin is not jaundiced or pale.      Findings: No erythema or rash.   Neurological:      Mental Status: She is alert and oriented to person, place, and time.      Cranial Nerves: No cranial nerve deficit. Sensory: No sensory deficit.      Motor: No weakness.      Coordination: Coordination normal.   Psychiatric:         Behavior: Behavior normal.         Thought Content: Thought content normal.         Judgment: Judgment normal.             Labs:  E2 05/14/15: 23  E2 07/02/15: 41  E2 10/01/15: 23  E2 12/31/15: 34  E2 07/21/16: 29  E2 11/17/16: <20  E2 05/28/18: <15.0  E2 11/01/18: <15.0  E2 05/02/19: <15.0  E2 10/31/19: In Process    Bone Health:  BMD 11/02/17: NORMAL   BMD 10/31/19: Normal       Assessment and Plan:  1.  34 y.o. pre-menopausal female with right, multicentric grade II IDC (ER 85%, PR 98%, Her2 1+, Hi-67: 6%).  S/P bilateral mastectomy/R SLNB, pT2N0.  She was found to have 3 areas of malignancy: Tumor #1 at 9:00 measured 2.3cm (ER 90%, PR 100%, Her2 2+, FISH: 1.5 with avg #Her2 signals 4.01 (negative), Ki-67: 6%).  Tumor #2 at 6:00 measured 6 mm (ER 96%, PR 100%, Her2 1+, Ki-67 7%). Tumor #3 in the Left outer quadrant measured 2mm (ER and PR 100%, HER2 IHC 2+, Ki-67 17%, HER2 FISH negative); 4 negative SLN. Completed adjuvant AC-Taxol 02/2015.  Started adjuvant tamoxifen in 04/2015 and OFS in 12/2015 for rising E2.  Continue with tamoxifen as menopausal symptoms are still present. Plan for OFS for 5 years. She is now 5 years from diagnosis, NED.     2. Long term plan includes 5-10 years of endocrine therapy.  E2 prior to starting tamoxifen was 23. Level from 09/2015 was 23 and was 220 on 12/31/15. At that time, we started Zoladex for OFS. She has a high copay with each injection. Will look into co pay assistance or see if there is a more affordable option.     3. Hot flashes:  Effexor at 112.5 mg PO QDAY and Gabapentin 300 qam and 600 mg PO QHS.      4. 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     5. Genetic testing. [employer] 26 gene panel testing was negative.    6. BMD normal today, repeat 10/2021.    7. Anxiety managed by PCP, now on Topamax with good results. Continue with monthly ovarian suppression. RTC in 1 year        Debra Massing, APRN-NP    Collaborating MD: Osborn Coho

## 2019-11-04 ENCOUNTER — Ambulatory Visit: Admit: 2019-11-04 | Discharge: 2019-11-04 | Payer: BC Managed Care – PPO

## 2019-11-04 ENCOUNTER — Encounter: Admit: 2019-11-04 | Discharge: 2019-11-04 | Payer: BC Managed Care – PPO

## 2019-11-04 DIAGNOSIS — C50411 Malignant neoplasm of upper-outer quadrant of right female breast: Secondary | ICD-10-CM

## 2019-11-04 DIAGNOSIS — Z421 Encounter for breast reconstruction following mastectomy: Secondary | ICD-10-CM

## 2019-11-04 DIAGNOSIS — Z20822 Encounter for screening laboratory testing for COVID-19 virus in asymptomatic patient: Secondary | ICD-10-CM

## 2019-11-07 ENCOUNTER — Encounter: Admit: 2019-11-07 | Discharge: 2019-11-07 | Payer: BC Managed Care – PPO

## 2019-11-07 MED ORDER — GABAPENTIN 100 MG PO CAP
ORAL_CAPSULE | Freq: Three times a day (TID) | 3 refills | Status: AC
Start: 2019-11-07 — End: ?

## 2019-11-12 ENCOUNTER — Encounter: Admit: 2019-11-12 | Discharge: 2019-11-12 | Payer: BC Managed Care – PPO

## 2019-11-14 ENCOUNTER — Encounter: Admit: 2019-11-14 | Discharge: 2019-11-14 | Payer: BC Managed Care – PPO

## 2019-11-28 ENCOUNTER — Encounter: Admit: 2019-11-28 | Discharge: 2019-11-28 | Payer: BC Managed Care – PPO

## 2019-11-29 ENCOUNTER — Encounter: Admit: 2019-11-29 | Discharge: 2019-11-29 | Payer: BC Managed Care – PPO

## 2019-11-29 DIAGNOSIS — C50411 Malignant neoplasm of upper-outer quadrant of right female breast: Principal | ICD-10-CM

## 2019-11-29 DIAGNOSIS — Z170 Estrogen receptor positive status [ER+]: Secondary | ICD-10-CM

## 2019-11-29 MED ORDER — GOSERELIN 3.6 MG SC IMPL
3.6 mg | Freq: Once | SUBCUTANEOUS | 0 refills | Status: CP
Start: 2019-11-29 — End: ?
  Administered 2019-11-29: 22:00:00 3.6 mg via SUBCUTANEOUS

## 2019-11-29 MED ORDER — LIDOCAINE (PF) 10 MG/ML (1 %) IJ SOLN
2 mL | Freq: Once | SUBCUTANEOUS | 0 refills | Status: CP
Start: 2019-11-29 — End: ?
  Administered 2019-11-29: 22:00:00 2 mL via SUBCUTANEOUS

## 2019-11-29 NOTE — Progress Notes
Patient received Zoladex injection and tolerated without difficulty.  No pertinent changes since last assessment.

## 2019-12-05 ENCOUNTER — Encounter: Admit: 2019-12-05 | Discharge: 2019-12-05 | Payer: BC Managed Care – PPO

## 2019-12-06 ENCOUNTER — Encounter: Admit: 2019-12-06 | Discharge: 2019-12-06 | Payer: BC Managed Care – PPO

## 2019-12-06 ENCOUNTER — Encounter: Admit: 2019-12-06 | Discharge: 2019-12-07 | Payer: BC Managed Care – PPO

## 2019-12-06 DIAGNOSIS — Z20822 Encounter for screening laboratory testing for COVID-19 virus in asymptomatic patient: Secondary | ICD-10-CM

## 2019-12-06 NOTE — Progress Notes
Patient arrived to COVID clinic for COVID-19 testing 12/06/19 1313. Patient identity confirmed via photo I.D. Nasopharyngeal procedure explained to the patient.   Nasopharyngeal swab completed right  Patient education provided given and instructed patient self isolate until contacted w/ results and further instructions. CDC handout on COVID-19 given to patient.   [email or web address]      Swab collected by Oleta Mouse.    Date symptoms began/reason for testing: Pre

## 2019-12-07 LAB — COVID-19 (SARS-COV-2) PCR

## 2019-12-09 ENCOUNTER — Ambulatory Visit: Admit: 2019-12-09 | Discharge: 2019-12-09 | Payer: BC Managed Care – PPO

## 2019-12-09 ENCOUNTER — Encounter: Admit: 2019-12-09 | Discharge: 2019-12-09 | Payer: BC Managed Care – PPO

## 2019-12-09 DIAGNOSIS — K219 Gastro-esophageal reflux disease without esophagitis: Secondary | ICD-10-CM

## 2019-12-09 DIAGNOSIS — C50911 Malignant neoplasm of unspecified site of right female breast: Secondary | ICD-10-CM

## 2019-12-09 DIAGNOSIS — R112 Nausea with vomiting, unspecified: Secondary | ICD-10-CM

## 2019-12-09 DIAGNOSIS — F419 Anxiety disorder, unspecified: Secondary | ICD-10-CM

## 2019-12-09 DIAGNOSIS — K929 Disease of digestive system, unspecified: Secondary | ICD-10-CM

## 2019-12-09 DIAGNOSIS — Z789 Other specified health status: Secondary | ICD-10-CM

## 2019-12-09 DIAGNOSIS — Z9221 Personal history of antineoplastic chemotherapy: Secondary | ICD-10-CM

## 2019-12-09 MED ORDER — VENLAFAXINE 75 MG PO CP24
75 mg | ORAL_CAPSULE | Freq: Every day | ORAL | 2 refills
Start: 2019-12-09 — End: ?

## 2019-12-09 MED ORDER — HYDROMORPHONE (PF) 2 MG/ML IJ SYRG
INTRAVENOUS | 0 refills | Status: DC
Start: 2019-12-09 — End: 2019-12-09
  Administered 2019-12-09: 17:00:00 1 mg via INTRAVENOUS
  Administered 2019-12-09: 18:00:00 .5 mg via INTRAVENOUS

## 2019-12-09 MED ORDER — CEFAZOLIN 1 GRAM IJ SOLR
INTRAVENOUS | 0 refills | Status: DC
Start: 2019-12-09 — End: 2019-12-09
  Administered 2019-12-09: 17:00:00 2 g via INTRAVENOUS

## 2019-12-09 MED ORDER — LIDOCAINE (PF) 20 MG/ML (2 %) IJ SOLN
INTRAVENOUS | 0 refills | Status: DC
Start: 2019-12-09 — End: 2019-12-09
  Administered 2019-12-09: 16:00:00 40 mg via INTRAVENOUS

## 2019-12-09 MED ORDER — FENTANYL CITRATE (PF) 50 MCG/ML IJ SOLN
INTRAVENOUS | 0 refills | Status: DC
Start: 2019-12-09 — End: 2019-12-09
  Administered 2019-12-09: 17:00:00 25 ug via INTRAVENOUS
  Administered 2019-12-09: 16:00:00 50 ug via INTRAVENOUS

## 2019-12-09 MED ORDER — MIDAZOLAM 1 MG/ML IJ SOLN
INTRAVENOUS | 0 refills | Status: DC
Start: 2019-12-09 — End: 2019-12-09
  Administered 2019-12-09: 16:00:00 2 mg via INTRAVENOUS

## 2019-12-09 MED ORDER — PROPOFOL 10 MG/ML IV EMUL 100 ML (INFUSION)(AM)(OR)
INTRAVENOUS | 0 refills | Status: DC
Start: 2019-12-09 — End: 2019-12-09
  Administered 2019-12-09: 16:00:00 150 ug/kg/min via INTRAVENOUS

## 2019-12-09 MED ORDER — ONDANSETRON HCL (PF) 4 MG/2 ML IJ SOLN
INTRAVENOUS | 0 refills | Status: DC
Start: 2019-12-09 — End: 2019-12-09
  Administered 2019-12-09: 18:00:00 4 mg via INTRAVENOUS

## 2019-12-09 MED ORDER — ARTIFICIAL TEARS SINGLE DOSE DROPS GROUP
OPHTHALMIC | 0 refills | Status: DC
Start: 2019-12-09 — End: 2019-12-09
  Administered 2019-12-09: 16:00:00 2 [drp] via OPHTHALMIC

## 2019-12-09 MED ORDER — DEXAMETHASONE SODIUM PHOSPHATE 4 MG/ML IJ SOLN
INTRAVENOUS | 0 refills | Status: DC
Start: 2019-12-09 — End: 2019-12-09
  Administered 2019-12-09: 17:00:00 4 mg via INTRAVENOUS

## 2019-12-09 MED ORDER — KETAMINE 10 MG/ML IJ SOLN
INTRAVENOUS | 0 refills | Status: DC
Start: 2019-12-09 — End: 2019-12-09
  Administered 2019-12-09: 17:00:00 30 mg via INTRAVENOUS

## 2019-12-09 MED ORDER — PROPOFOL INJ 10 MG/ML IV VIAL
INTRAVENOUS | 0 refills | Status: DC
Start: 2019-12-09 — End: 2019-12-09
  Administered 2019-12-09: 17:00:00 30 mg via INTRAVENOUS
  Administered 2019-12-09: 17:00:00 20 mg via INTRAVENOUS
  Administered 2019-12-09: 16:00:00 200 mg via INTRAVENOUS
  Administered 2019-12-09: 17:00:00 50 mg via INTRAVENOUS

## 2019-12-09 MED ADMIN — FENTANYL CITRATE (PF) 50 MCG/ML IJ SOLN [3037]: 50 ug | INTRAVENOUS | @ 19:00:00 | Stop: 2019-12-09 | NDC 63323080612

## 2019-12-09 MED ADMIN — LACTATED RINGERS IV SOLP [4318]: 1000 mL | INTRAVENOUS | @ 15:00:00 | Stop: 2019-12-09 | NDC 00338011704

## 2019-12-09 MED ADMIN — LIDOCAINE (PF) 10 MG/ML (1 %) IJ SOLN [95838]: 3090 mL | @ 17:00:00 | Stop: 2019-12-09 | NDC 00409427916

## 2019-12-09 MED ADMIN — OXYCODONE 5 MG PO TAB [10814]: 10 mg | ORAL | @ 19:00:00 | Stop: 2019-12-09 | NDC 00904696661

## 2019-12-09 MED ADMIN — EPINEPHRINE HCL (PF) 1 MG/ML (1 ML) IJ SOLN [136248]: 3090 mL | @ 17:00:00 | Stop: 2019-12-09 | NDC 00409724101

## 2019-12-09 MED ADMIN — LACTATED RINGERS IV SOLP [4318]: 3090 mL | @ 17:00:00 | Stop: 2019-12-09 | NDC 00338011704

## 2019-12-09 MED ADMIN — ACETAMINOPHEN 500 MG PO TAB [102]: 1000 mg | ORAL | @ 15:00:00 | Stop: 2019-12-09 | NDC 00904673061

## 2019-12-09 MED FILL — DOXYCYCLINE HYCLATE 100 MG PO CAP: 100 mg | ORAL | 14 days supply | Qty: 28 | Fill #1 | Status: CP

## 2019-12-09 MED FILL — TRAMADOL 50 MG PO TAB: 50 mg | ORAL | 7 days supply | Qty: 25 | Fill #1 | Status: CP

## 2019-12-09 MED FILL — ONDANSETRON HCL 4 MG PO TAB: 4 mg | ORAL | 5 days supply | Qty: 14 | Fill #1 | Status: CP

## 2019-12-09 NOTE — Anesthesia Post-Procedure Evaluation
Post-Anesthesia Evaluation    Name: Debra Fleming      MRN: [id number]     DOB: August 31, 1985     Age: 34 y.o.     Sex: female   __________________________________________________________________________     Procedure Information     Anesthesia Start Date/Time: 12/09/19 1123    Procedures:       GRAFTING AUTOLOGOUS FAT BY LIPOSUCTION TO TRUNK/ BREASTS/ SCALP/ ARMS/ LEGS - 50 CC OR LESS (Bilateral )      GRAFTING AUTOLOGOUS FAT BY LIPOSUCTION TO TRUNK/ BREASTS/ SCALP/ ARMS/ LEGS - EACH ADDITIONAL 50 CC (Bilateral )      ADJACENT TISSUE TRANSFER/ REARRANGEMENT 10 SQ CM OR LESS - TORSO (Right )    Location: MAIN OR 12 / Main OR/Periop    Surgeons: Corliss Skains, MD          Post-Anesthesia Vitals  BP: 137/74 (09/13 1430)  Temp: 36.4 ?C (97.5 ?F) (09/13 1345)  Pulse: 95 (09/13 1430)  Respirations: 16 PER MINUTE (09/13 1430)  SpO2: 97 % (09/13 1430)  SpO2 Pulse: 102 (09/13 1430)   Vitals Value Taken Time   BP 137/74 12/09/19 1430   Temp 36.4 ?C (97.5 ?F) 12/09/19 1345   Pulse 95 12/09/19 1430   Respirations 16 PER MINUTE 12/09/19 1430   SpO2 97 % 12/09/19 1430         Post Anesthesia Evaluation Note    Evaluation location: Pre/Post  Patient participation: recovered; patient participated in evaluation  Level of consciousness: alert    Pain score: 5  Pain management: adequate    Hydration: normovolemia  Temperature: 36.0?C - 38.4?C  Airway patency: adequate    Perioperative Events       Post-op nausea and vomiting: no PONV    Postoperative Status  Cardiovascular status: hemodynamically stable  Respiratory status: spontaneous ventilation  Follow-up needed: none        Perioperative Events  Perioperative Event: No  Emergency Case Activation: No

## 2019-12-11 ENCOUNTER — Encounter: Admit: 2019-12-11 | Discharge: 2019-12-11 | Payer: BC Managed Care – PPO

## 2019-12-11 DIAGNOSIS — F419 Anxiety disorder, unspecified: Secondary | ICD-10-CM

## 2019-12-11 DIAGNOSIS — Z789 Other specified health status: Secondary | ICD-10-CM

## 2019-12-11 DIAGNOSIS — R112 Nausea with vomiting, unspecified: Secondary | ICD-10-CM

## 2019-12-11 DIAGNOSIS — K929 Disease of digestive system, unspecified: Secondary | ICD-10-CM

## 2019-12-11 DIAGNOSIS — Z9221 Personal history of antineoplastic chemotherapy: Secondary | ICD-10-CM

## 2019-12-11 DIAGNOSIS — K219 Gastro-esophageal reflux disease without esophagitis: Secondary | ICD-10-CM

## 2019-12-11 DIAGNOSIS — C50911 Malignant neoplasm of unspecified site of right female breast: Secondary | ICD-10-CM

## 2019-12-20 ENCOUNTER — Encounter: Admit: 2019-12-20 | Discharge: 2019-12-20 | Payer: BC Managed Care – PPO

## 2019-12-20 ENCOUNTER — Ambulatory Visit: Admit: 2019-12-20 | Discharge: 2019-12-20 | Payer: BC Managed Care – PPO

## 2019-12-20 DIAGNOSIS — C50911 Malignant neoplasm of unspecified site of right female breast: Secondary | ICD-10-CM

## 2019-12-20 DIAGNOSIS — K219 Gastro-esophageal reflux disease without esophagitis: Secondary | ICD-10-CM

## 2019-12-20 DIAGNOSIS — Z789 Other specified health status: Secondary | ICD-10-CM

## 2019-12-20 DIAGNOSIS — Z421 Encounter for breast reconstruction following mastectomy: Secondary | ICD-10-CM

## 2019-12-20 DIAGNOSIS — R112 Nausea with vomiting, unspecified: Secondary | ICD-10-CM

## 2019-12-20 DIAGNOSIS — F419 Anxiety disorder, unspecified: Secondary | ICD-10-CM

## 2019-12-20 DIAGNOSIS — Z9221 Personal history of antineoplastic chemotherapy: Secondary | ICD-10-CM

## 2019-12-20 DIAGNOSIS — K929 Disease of digestive system, unspecified: Secondary | ICD-10-CM

## 2019-12-20 NOTE — Progress Notes
Subjective:  HPI    Debra Fleming is a 34 y.o. female.  12/09/2019 GRAFTING AUTOLOGOUS FAT BY LIPOSUCTION TO BREASTS LEFT 92 CC AND RIGHT 197 CC (Bilateral)  ADJACENT TISSUE TRANSFER/ REARRANGEMENT RIGHT LATERAL MASTECTOMY SCAR 8 SQ CM   03/11/2020?GRAFTING AUTOLOGOUS FAT BY LIPOSUCTION TO BREASTS LEFT 165 CC, RIGHT 168 CC TOTAL 333 CC (Bilateral)  GRAFTING AUTOLOGOUS FAT BY LIPOSUCTION TO BREASTS LEFT 165 CC, RIGHT 168 CC TOTAL 333 CC (Bilateral)  INSERTION SIENTRA 10721-540MP BREAST PROSTHESIS IN RECONSTRUCTION - IMMEDIATE (Bilateral)  Doing well   ?  ROS    Current Outpatient Medications   Medication Sig Dispense Refill   ? diazePAM (VALIUM) 5 mg tablet Take one tablet by mouth every 8 hours as needed for Anxiety. Indications: muscle spasm 30 tablet 0   ? doxycycline hyclate (VIBRAMYCIN) 100 mg capsule Take one capsule by mouth twice daily for 14 days. 28 capsule 0   ? gabapentin (NEURONTIN) 100 mg capsule take 2 capsules BY MOUTH EVERY 8 HOURS 120 capsule 3   ? gabapentin (NEURONTIN) 300 mg capsule TAKE 3 CAPSULES BY MOUTH AT BEDTIME DAILY. 270 capsule 3   ? goserelin (ZOLADEX) 3.6 mg implant syringe Inject 3.6 mg under the skin every 28 days.     ? lisinopriL (ZESTRIL) 20 mg tablet Take 20 mg by mouth daily.     ? ondansetron (ZOFRAN) 4 mg tablet Take one tablet by mouth every 8 hours as needed for Nausea or Vomiting. 20 tablet 0   ? ondansetron HCL (ZOFRAN) 4 mg tablet Take one tablet by mouth every 8 hours. 14 tablet 0   ? tamoxifen (NOLVADEX) 20 mg tablet TAKE ONE TABLET BY MOUTH ONCE DAILY 90 tablet 3   ? topiramate (TOPAMAX) 50 mg tablet Take 50 mg by mouth every 12 hours.     ? traMADoL (ULTRAM) 50 mg tablet Take one tablet by mouth every 6 hours. 25 tablet 0   ? venlafaxine XR (EFFEXOR XR) 37.5 mg capsule TAKE 1 CAPSULE BY MOUTH DAILY. TAKE WITH FOOD. 90 capsule 5   ? venlafaxine XR (EFFEXOR XR) 75 mg capsule TAKE 1 CAPSULE BY MOUTH DAILY. TAKE WITH FOOD. 90 capsule 2   ? zolpidem (AMBIEN) 10 mg tablet take 1 tablet BY MOUTH at bedtime 30 tablet 4       Objective:    Vitals:    12/20/19 1424   BP: 138/85   BP Source: Arm, Right Upper   Patient Position: Sitting   Pulse: 87   Temp: 36.7 ?C (98 ?F)   TempSrc: Skin   Weight: 90.7 kg (200 lb)   Height: 167.6 cm (66)   PainSc: Zero     Estimated body mass index is 32.28 kg/m? as calculated from the following:    Height as of this encounter: 167.6 cm (66).    Weight as of this encounter: 90.7 kg (200 lb).  Body surface area is 2.06 meters squared.   Physical Exam  Healing well  Shape and contours good  Sutures out  Assessment:    No diagnosis found.    Plan:  Steri  Check in 3-4 months  No problem-specific Assessment & Plan notes found for this encounter.

## 2019-12-27 ENCOUNTER — Encounter: Admit: 2019-12-27 | Discharge: 2019-12-27 | Payer: BC Managed Care – PPO

## 2019-12-27 DIAGNOSIS — C50411 Malignant neoplasm of upper-outer quadrant of right female breast: Secondary | ICD-10-CM

## 2019-12-27 MED ORDER — GOSERELIN 3.6 MG SC IMPL
3.6 mg | Freq: Once | SUBCUTANEOUS | 0 refills | Status: CP
Start: 2019-12-27 — End: ?
  Administered 2019-12-27: 22:00:00 3.6 mg via SUBCUTANEOUS

## 2019-12-27 MED ORDER — LIDOCAINE (PF) 10 MG/ML (1 %) IJ SOLN
2 mL | Freq: Once | SUBCUTANEOUS | 0 refills | Status: CP
Start: 2019-12-27 — End: ?
  Administered 2019-12-27: 22:00:00 2 mL via SUBCUTANEOUS

## 2019-12-27 NOTE — Progress Notes
Patient received Lidocaine and Zoladex and tolerated without difficulty.  No pertinent changes since last assessment.

## 2020-01-07 ENCOUNTER — Encounter: Admit: 2020-01-07 | Discharge: 2020-01-07 | Payer: BC Managed Care – PPO

## 2020-01-24 ENCOUNTER — Encounter: Admit: 2020-01-24 | Discharge: 2020-01-24 | Payer: BC Managed Care – PPO

## 2020-01-24 MED ORDER — TAMOXIFEN 20 MG PO TAB
ORAL_TABLET | Freq: Every day | 3 refills | Status: AC
Start: 2020-01-24 — End: ?

## 2020-01-27 ENCOUNTER — Encounter: Admit: 2020-01-27 | Discharge: 2020-01-27 | Payer: BC Managed Care – PPO

## 2020-01-27 DIAGNOSIS — C50411 Malignant neoplasm of upper-outer quadrant of right female breast: Secondary | ICD-10-CM

## 2020-01-27 MED ORDER — LIDOCAINE (PF) 10 MG/ML (1 %) IJ SOLN
2 mL | Freq: Once | SUBCUTANEOUS | 0 refills | Status: CP
Start: 2020-01-27 — End: ?
  Administered 2020-01-27: 20:00:00 2 mL via SUBCUTANEOUS

## 2020-01-27 MED ORDER — GOSERELIN 3.6 MG SC IMPL
3.6 mg | Freq: Once | SUBCUTANEOUS | 0 refills | Status: CP
Start: 2020-01-27 — End: ?
  Administered 2020-01-27: 20:00:00 3.6 mg via SUBCUTANEOUS

## 2020-01-27 NOTE — Patient Instructions
[facility]  Chemotherapy Instructions    Debra Fleming 01/27/2020    Chemotherapy Drugs:      Zoladex and lidocaine injections    Call Immediately to report the following:  Unexplained bleeding or bleeding that will not stop  Difficulty swallowing  Shortness of breath, wheezing, or trouble breathing  Rapid, irregular heartbeat; chest pain  Dizziness, lightheadedness  Rash or cut that swells or turns red, feels hot or painful, or begin to ooze  Diarrhea   Uncontrolled nausea or vomiting  Fever of 100.4 F or higher, or chills    Important Phone Numbers:  Cancer Center Main Number (answered 24 hours a day) [phone]  Cancer Center Scheduling (appointments) [phone]  Social Worker [phone]

## 2020-01-27 NOTE — Progress Notes
Patient arrived to CC treatment for Zoladex. VS stable. Patient received Zoladex and lidocaine injections and tolerated without difficulty. Schedule reviewed with patient. Pt left clinic without further questions or concerns. - Loraine Leriche, RN

## 2020-02-10 ENCOUNTER — Encounter: Admit: 2020-02-10 | Discharge: 2020-02-10 | Payer: BC Managed Care – PPO

## 2020-02-10 MED ORDER — ZOLPIDEM 10 MG PO TAB
ORAL_TABLET | Freq: Every evening | ORAL | 4 refills | 30.00000 days | Status: AC
Start: 2020-02-10 — End: ?

## 2020-02-14 ENCOUNTER — Encounter: Admit: 2020-02-14 | Discharge: 2020-02-14 | Payer: BC Managed Care – PPO

## 2020-02-17 ENCOUNTER — Encounter: Admit: 2020-02-17 | Discharge: 2020-02-17 | Payer: BC Managed Care – PPO

## 2020-02-18 ENCOUNTER — Encounter: Admit: 2020-02-18 | Discharge: 2020-02-18 | Payer: BC Managed Care – PPO

## 2020-02-18 ENCOUNTER — Ambulatory Visit: Admit: 2020-02-18 | Discharge: 2020-02-18 | Payer: BC Managed Care – PPO

## 2020-02-18 DIAGNOSIS — Z9221 Personal history of antineoplastic chemotherapy: Secondary | ICD-10-CM

## 2020-02-18 DIAGNOSIS — C50911 Malignant neoplasm of unspecified site of right female breast: Secondary | ICD-10-CM

## 2020-02-18 DIAGNOSIS — K929 Disease of digestive system, unspecified: Secondary | ICD-10-CM

## 2020-02-18 DIAGNOSIS — K219 Gastro-esophageal reflux disease without esophagitis: Secondary | ICD-10-CM

## 2020-02-18 DIAGNOSIS — Z789 Other specified health status: Secondary | ICD-10-CM

## 2020-02-18 DIAGNOSIS — R112 Nausea with vomiting, unspecified: Secondary | ICD-10-CM

## 2020-02-18 DIAGNOSIS — F419 Anxiety disorder, unspecified: Secondary | ICD-10-CM

## 2020-02-24 ENCOUNTER — Encounter: Admit: 2020-02-24 | Discharge: 2020-02-24 | Payer: BC Managed Care – PPO

## 2020-02-24 ENCOUNTER — Ambulatory Visit: Admit: 2020-02-24 | Discharge: 2020-02-25 | Payer: BC Managed Care – PPO

## 2020-02-24 DIAGNOSIS — K929 Disease of digestive system, unspecified: Secondary | ICD-10-CM

## 2020-02-24 DIAGNOSIS — Z9221 Personal history of antineoplastic chemotherapy: Secondary | ICD-10-CM

## 2020-02-24 DIAGNOSIS — Z9013 Acquired absence of bilateral breasts and nipples: Secondary | ICD-10-CM

## 2020-02-24 DIAGNOSIS — Z789 Other specified health status: Secondary | ICD-10-CM

## 2020-02-24 DIAGNOSIS — K219 Gastro-esophageal reflux disease without esophagitis: Secondary | ICD-10-CM

## 2020-02-24 DIAGNOSIS — F419 Anxiety disorder, unspecified: Secondary | ICD-10-CM

## 2020-02-24 DIAGNOSIS — R112 Nausea with vomiting, unspecified: Secondary | ICD-10-CM

## 2020-02-24 DIAGNOSIS — C50911 Malignant neoplasm of unspecified site of right female breast: Secondary | ICD-10-CM

## 2020-02-24 NOTE — Progress Notes
Subjective:       History of Present Illness  Debra Fleming is a 34 y.o. female who presents to discuss autologous tissue reconstruction for bilateral breasts.  She had undergone a bilateral nipple sparing mastectomy and tissue expander placement in the prepectoral plane in 2016 for right invasive ductal carcinoma.  She received chemotherapy but did not require radiation therapy.  She underwent exchange of her tissue expanders for silicone implants in 2017.  These ultimately flipped.  She had multiple rounds of fat grafting.  She underwent exchange of her implants in 2018 to larger implants.  These also flipped.  In 2020 she underwent exchange again to a smaller implant and these continue to flip.  The list of operations is below.  She presents today to discuss different options with regards to having a lower maintenance durable reconstruction.    2016: Bilateral nipple sparing mastectomy plus tissue expander reconstruction  2017: Exchange of TEs to implants  2017: Fat grafting from abdomen and thighs  2018: Fat grafting from abdomen and thighs   2018: Implant exchange to SCX-800 implants  2019: Fat grafting from abdomen and thighs  2020: Implant exchange to Sientra 540 cc implants plus fat grafting from abdomen and thighs  2021: Fat grafting from abdomen and thighs    Past medical history: Right shoulder injury needing intervention, GERD, anxiety, hypertension    Medications: Zoladex, tamoxifen, Neurontin, lisinopril, Topamax, Effexor, Ambien    Allergies: Penicillin, adhesive    Social history: She is non-smoker having quit in 2007.  She has 2 children, no C-sections, no miscarriages    Family history: Noncontributory         Review of Systems   Constitutional: Negative.    HENT: Negative.    Eyes: Negative.    Respiratory: Negative.    Cardiovascular: Negative.    Gastrointestinal: Negative.    Endocrine: Negative.    Genitourinary: Negative.    Musculoskeletal: Negative.    Skin: Negative. Allergic/Immunologic: Negative.    Neurological: Negative.    Hematological: Negative.    Psychiatric/Behavioral: Negative.          Objective:         ? diazePAM (VALIUM) 5 mg tablet Take one tablet by mouth every 8 hours as needed for Anxiety. Indications: muscle spasm   ? gabapentin (NEURONTIN) 100 mg capsule take 2 capsules BY MOUTH EVERY 8 HOURS   ? gabapentin (NEURONTIN) 300 mg capsule TAKE 3 CAPSULES BY MOUTH AT BEDTIME DAILY.   ? goserelin (ZOLADEX) 3.6 mg implant syringe Inject 3.6 mg under the skin every 28 days.   ? lisinopriL (ZESTRIL) 20 mg tablet Take 20 mg by mouth daily.   ? ondansetron (ZOFRAN) 4 mg tablet Take one tablet by mouth every 8 hours as needed for Nausea or Vomiting.   ? ondansetron HCL (ZOFRAN) 4 mg tablet Take one tablet by mouth every 8 hours.   ? tamoxifen (NOLVADEX) 20 mg tablet TAKE ONE TABLET BY MOUTH ONCE DAILY   ? topiramate (TOPAMAX) 50 mg tablet Take 50 mg by mouth every 12 hours.   ? traMADoL (ULTRAM) 50 mg tablet Take one tablet by mouth every 6 hours.   ? venlafaxine XR (EFFEXOR XR) 37.5 mg capsule TAKE 1 CAPSULE BY MOUTH DAILY. TAKE WITH FOOD.   ? venlafaxine XR (EFFEXOR XR) 75 mg capsule TAKE 1 CAPSULE BY MOUTH DAILY. TAKE WITH FOOD.   ? zolpidem (AMBIEN) 10 mg tablet TAKE 1 TABLET BY MOUTH at bedtime  Vitals:    02/24/20 0859   BP: 129/86   BP Source: Arm, Left Upper   Patient Position: Sitting   Pulse: 80   Temp: 36.2 ?C (97.2 ?F)   TempSrc: Skin   Weight: 93.4 kg (206 lb)   Height: 167.6 cm (66)   PainSc: Zero     Body mass index is 33.25 kg/m?Marland Kitchen     Physical Exam  On exam, she is well-appearing in no obvious distress.  Her BMI is 33.  She has radial scars on her breast emanating from the lateral extent of her NAC to the lateral breast footprint.  She actually has pretty aesthetic appearing breast reconstructions.  She has no rippling superiorly.  She has good pocket from the fat grafting procedures.  The implants do not appear to be flipped at this time.  There is minimal to no capsular contracture.    Her abdomen is pretty deflated from the rounds of fat grafting especially laterally.  Her amount of tissue would be quite a bit smaller than what she has on her chest at this time.    Her thighs have more tissue than her abdomen does for the purposes of reconstruction if were trying to match her volumes as they are right now.       Assessment and Plan:  We had a detailed conversation with regards to the technical aspects of autologous reconstruction.  We discussed the risks and benefits of these operations.  We discussed the expected outcomes and planned recovery.  Based on the volumes that she has in her abdomen and thighs, I think she would be a better profunda artery perforator flap candidate then she would a deep inferior epigastric artery perforator flap candidate.  We discussed that I have been able to successfully perform autologous tissue reconstructions from donor sites that have had fat grafting, but she has had 5 rounds of fat grafting from these donor sites which may put them at risk of fat necrosis and microvascular compromise.  We would certainly need to get a CTA of the abdomen and pelvis to look at her abdomen, thighs, lumbar tissue and superior gluteal artery tissue to determine the viability of these areas.  Even with that, CTs are static imaging modalities and we would not necessarily know what the overall perfusion was until the tissue was isolated and we performed our indocyanine green injections.  They had multiple questions which I believe were answered satisfactorily.    There can I think about their options and get back to [place].    If they choose to move forward, the following would be the plan:  1) CTA of abdomen and thighs to look at deep inferior epigastric artery perforators and profundal artery perforators  2) bilateral profunda artery perforator flaps    45 minutes was spent with the patient, greater than 60% of which was counseling.

## 2020-02-27 ENCOUNTER — Encounter: Admit: 2020-02-27 | Discharge: 2020-02-27 | Payer: BC Managed Care – PPO

## 2020-03-02 ENCOUNTER — Encounter: Admit: 2020-03-02 | Discharge: 2020-03-02 | Payer: BC Managed Care – PPO

## 2020-03-02 DIAGNOSIS — C50411 Malignant neoplasm of upper-outer quadrant of right female breast: Secondary | ICD-10-CM

## 2020-03-02 MED ORDER — GOSERELIN 3.6 MG SC IMPL
3.6 mg | Freq: Once | SUBCUTANEOUS | 0 refills | Status: CP
Start: 2020-03-02 — End: ?
  Administered 2020-03-02: 17:00:00 3.6 mg via SUBCUTANEOUS

## 2020-03-02 MED ORDER — LIDOCAINE (PF) 10 MG/ML (1 %) IJ SOLN
2 mL | Freq: Once | SUBCUTANEOUS | 0 refills | Status: CP
Start: 2020-03-02 — End: ?
  Administered 2020-03-02: 17:00:00 2 mL via SUBCUTANEOUS

## 2020-03-02 NOTE — Progress Notes
Patient received goserelin (ZOLADEX) implant syringe 3.6 mg     and tolerated without difficulty.  No pertinent changes since last assessment. Pt did not have any questions or concerns at this time and left ambulatory with all belongings.

## 2020-03-10 ENCOUNTER — Encounter: Admit: 2020-03-10 | Discharge: 2020-03-10 | Payer: BC Managed Care – PPO

## 2020-03-10 MED ORDER — GABAPENTIN 100 MG PO CAP
200 mg | ORAL_CAPSULE | Freq: Every morning | ORAL | 2 refills | Status: AC
Start: 2020-03-10 — End: ?

## 2020-03-10 NOTE — Telephone Encounter
Called and spoke to patient. She reports taking Gabapentin 200mg  in the morning and 900mg  before bed. Prescription for 100mg  will be sent to pharmacy.

## 2020-03-16 ENCOUNTER — Encounter: Admit: 2020-03-16 | Discharge: 2020-03-16 | Payer: BC Managed Care – PPO

## 2020-03-16 ENCOUNTER — Ambulatory Visit: Admit: 2020-03-16 | Discharge: 2020-03-16 | Payer: BC Managed Care – PPO

## 2020-03-16 DIAGNOSIS — N650 Deformity of reconstructed breast: Secondary | ICD-10-CM

## 2020-03-16 DIAGNOSIS — C50911 Malignant neoplasm of unspecified site of right female breast: Secondary | ICD-10-CM

## 2020-03-16 DIAGNOSIS — Z421 Encounter for breast reconstruction following mastectomy: Secondary | ICD-10-CM

## 2020-03-16 DIAGNOSIS — Z789 Other specified health status: Secondary | ICD-10-CM

## 2020-03-16 DIAGNOSIS — K219 Gastro-esophageal reflux disease without esophagitis: Secondary | ICD-10-CM

## 2020-03-16 DIAGNOSIS — F419 Anxiety disorder, unspecified: Secondary | ICD-10-CM

## 2020-03-16 DIAGNOSIS — K929 Disease of digestive system, unspecified: Secondary | ICD-10-CM

## 2020-03-16 DIAGNOSIS — Z9221 Personal history of antineoplastic chemotherapy: Secondary | ICD-10-CM

## 2020-03-16 DIAGNOSIS — R112 Nausea with vomiting, unspecified: Secondary | ICD-10-CM

## 2020-03-16 NOTE — Progress Notes
Date of Service: 03/16/2020    Subjective:             Debra Fleming is a 34 y.o. female.    History of Present Illness  2016: Bilateral nipple sparing mastectomy plus tissue expander reconstruction  2017: Exchange of TEs to implants  2017: Fat grafting from abdomen and thighs  2018: Fat grafting from abdomen and thighs   2018: Implant exchange to SCX-800 implants  2019: Fat grafting from abdomen and thighs  2020: Implant exchange to Sientra 540 cc implants plus fat grafting from abdomen and thighs  2021: Fat grafting from abdomen and thighs    Has seen Dr Kathaleen Bury and has discussed CTA/PAP flaps  They aren't comfortable with that approach       Review of Systems   Constitutional: Negative.    HENT: Negative.    Eyes: Negative.    Respiratory: Negative.    Cardiovascular: Negative.    Gastrointestinal: Negative.    Endocrine: Negative.    Genitourinary: Negative.    Musculoskeletal: Negative.    Skin: Negative.    Allergic/Immunologic: Negative.    Neurological: Negative.    Hematological: Negative.    Psychiatric/Behavioral: Negative.          Objective:         ? diazePAM (VALIUM) 5 mg tablet Take one tablet by mouth every 8 hours as needed for Anxiety. Indications: muscle spasm   ? gabapentin (NEURONTIN) 100 mg capsule Take two capsules by mouth every morning.   ? gabapentin (NEURONTIN) 300 mg capsule TAKE 3 CAPSULES BY MOUTH AT BEDTIME DAILY.   ? GEMTESA 75 mg tablet    ? goserelin (ZOLADEX) 3.6 mg implant syringe Inject 3.6 mg under the skin every 28 days.   ? ibuprofen (MOTRIN) 800 mg tablet TID PRN   ? levocetirizine (XYZAL) 5 mg tablet Take 5 mg by mouth.   ? lisinopriL (ZESTRIL) 20 mg tablet Take 20 mg by mouth daily.   ? naproxen (NAPROSYN) 250 mg tablet Q8H   ? ondansetron (ZOFRAN) 4 mg tablet Take one tablet by mouth every 8 hours as needed for Nausea or Vomiting.   ? ondansetron HCL (ZOFRAN) 4 mg tablet Take one tablet by mouth every 8 hours.   ? tamoxifen (NOLVADEX) 20 mg tablet TAKE ONE TABLET BY MOUTH ONCE DAILY   ? topiramate (TOPAMAX) 50 mg tablet Take 50 mg by mouth every 12 hours.   ? traMADoL (ULTRAM) 50 mg tablet Take one tablet by mouth every 6 hours.   ? venlafaxine XR (EFFEXOR XR) 37.5 mg capsule TAKE 1 CAPSULE BY MOUTH DAILY. TAKE WITH FOOD.   ? venlafaxine XR (EFFEXOR XR) 75 mg capsule TAKE 1 CAPSULE BY MOUTH DAILY. TAKE WITH FOOD.   ? zolpidem (AMBIEN) 10 mg tablet TAKE 1 TABLET BY MOUTH at bedtime     Vitals:    03/16/20 1355   BP: 126/75   BP Source: Arm, Left Upper   Patient Position: Sitting   Pulse: 82   Temp: 36.4 ?C (97.5 ?F)   TempSrc: Temporal   SpO2: 100%   Weight: 93.4 kg (206 lb)   Height: 167.6 cm (66)   PainSc: Zero     Body mass index is 33.25 kg/m?Marland Kitchen     Physical Exam  Vitals and nursing note reviewed.   Constitutional:       Appearance: She is obese.   HENT:      Head: Normocephalic.   Chest:  Breasts:         Right: Absent.         Left: Absent.      Comments: Moderate thick flap  Current position good  Abdominal:      General: Abdomen is protuberant.      Comments: Skin laxity  Moderate adipose   Neurological:      Mental Status: She is oriented to person, place, and time.   Psychiatric:         Behavior: Behavior normal.              Assessment and Plan:  Discussed abdominoplasty with liposuction, bilateral capsulectomy, removal of implants, bilateral breast fat grafting with abdominal flank donor sites  5 hr GA 23 hr   3 hr insurance and 2 hr cosmetic  23 hr will be insurance  Aware of drains and expected result/recovery

## 2020-03-30 ENCOUNTER — Encounter: Admit: 2020-03-30 | Discharge: 2020-03-30 | Payer: BC Managed Care – PPO

## 2020-03-30 DIAGNOSIS — C50411 Malignant neoplasm of upper-outer quadrant of right female breast: Secondary | ICD-10-CM

## 2020-03-30 MED ORDER — GOSERELIN 3.6 MG SC IMPL
3.6 mg | Freq: Once | SUBCUTANEOUS | 0 refills | Status: CP
Start: 2020-03-30 — End: ?
  Administered 2020-03-30: 21:00:00 3.6 mg via SUBCUTANEOUS

## 2020-03-30 MED ORDER — LIDOCAINE (PF) 10 MG/ML (1 %) IJ SOLN
2 mL | Freq: Once | SUBCUTANEOUS | 0 refills | Status: CP
Start: 2020-03-30 — End: ?
  Administered 2020-03-30: 21:00:00 2 mL via SUBCUTANEOUS

## 2020-03-30 NOTE — Progress Notes
Patient received ZOLADEX-D1C53 and tolerated without difficulty.  No pertinent changes since last assessment.

## 2020-03-30 NOTE — Progress Notes
Call Date for PVP: 03/30/20  Date of appointment: 04/02/20  Reason for referral/visit: migraines  Is this as a result of an injury? no   PCP: Lona Kettle, MD  Referred by: PCP  Previous neurologist: Juluis Pitch providers/specialty: Plastics - [place]  Imaging/Procedures, Location, and Date (MRI/MRA/CT/LP/EMG/EEG/XRAY): [facility] Dr. Venetia Constable  CT sinuses 09/2019  Any Recent ED visits or Hospitalizations: NO    Medications Updated    Botox has had 3 rounds, last injection 03/25/2020 - helped some  Topiramate  Venlafaxine   Sumatriptan  Rizatriptan

## 2020-04-02 ENCOUNTER — Ambulatory Visit: Admit: 2020-04-02 | Discharge: 2020-04-03 | Payer: BC Managed Care – PPO

## 2020-04-02 ENCOUNTER — Encounter: Admit: 2020-04-02 | Discharge: 2020-04-02 | Payer: BC Managed Care – PPO

## 2020-04-02 DIAGNOSIS — F419 Anxiety disorder, unspecified: Secondary | ICD-10-CM

## 2020-04-02 DIAGNOSIS — Z9221 Personal history of antineoplastic chemotherapy: Secondary | ICD-10-CM

## 2020-04-02 DIAGNOSIS — C50911 Malignant neoplasm of unspecified site of right female breast: Secondary | ICD-10-CM

## 2020-04-02 DIAGNOSIS — K219 Gastro-esophageal reflux disease without esophagitis: Secondary | ICD-10-CM

## 2020-04-02 DIAGNOSIS — G43909 Migraine, unspecified, not intractable, without status migrainosus: Secondary | ICD-10-CM

## 2020-04-02 DIAGNOSIS — R112 Nausea with vomiting, unspecified: Secondary | ICD-10-CM

## 2020-04-02 DIAGNOSIS — K929 Disease of digestive system, unspecified: Secondary | ICD-10-CM

## 2020-04-02 DIAGNOSIS — Z789 Other specified health status: Secondary | ICD-10-CM

## 2020-04-02 DIAGNOSIS — G43719 Chronic migraine without aura, intractable, without status migrainosus: Secondary | ICD-10-CM

## 2020-04-02 DIAGNOSIS — R1319 Other dysphagia: Secondary | ICD-10-CM

## 2020-04-02 MED ORDER — EMGALITY PEN 120 MG/ML SC PNIJ
120 mg | SUBCUTANEOUS | 0 refills | Status: CN
Start: 2020-04-02 — End: ?

## 2020-04-02 MED ORDER — EMGALITY PEN 120 MG/ML SC PNIJ
240 mg | SUBCUTANEOUS | 0 refills | Status: AC
Start: 2020-04-02 — End: ?

## 2020-04-02 NOTE — Progress Notes
--   Trial of Emgality discussed. This is a Calcitonin-gene related peptide antagonist to prevent migraines. It is recommended to start a loading dose injections of a total of '240mg'$ , then '120mg'$  subcutaneous injections monthly thereafter.  It should NOT be used in pregnancy or if you are planning to become pregnant, as the risks are NOT known. We do not currently have long term data on this medication, but at this time, the main warning is of a hypersensitivity reaction that can cause hives, rarely trouble breathing/swallowing, or a more serious reaction. Injection site reactions were more common, but should be tolerable.  The initial goal with adding this medication is for a reduction in headache days by just a few days per month. Unfortunately, this medication will not likely result in headache freedom, just as other medications you may have tried. Please call with any questions of concerns with this medication.

## 2020-04-02 NOTE — Progress Notes
Date of Service: 04/02/2020    Subjective:             Debra Fleming is a 35 y.o. female.    CC: Migraine    History of Present Illness    Debra Fleming is a 35 year old Female with a PMH of breast cancer s/p resection and chemotherapy, who presents for the first time to neurology clinic for migraine.  She has had headaches ever since she was a child, but her headaches became much worse during treatment for her breast cancer, which started about 4 years ago.    She describes her headaches as a bilateral pressure or pulsatile sensation in the bilateral frontotemporal region, as well as the neck.  She has sensitivity to sound and smell.  She has no sensitivity to light, nausea, vomiting or visual auras.  If she does not take medications, the headache can last all day and into the night and she is unable to do anything but lie down and rest.    About 1 year ago, she developed paraesthesias of the face.  These started out as a feeling in the area of the left eyebrow that she describes as a caterpillar crawling or like there is a hair on her face that she can't brush away.  Now, she can have paraesthesias of the entire face, although the left side continues to be more affected than the right.  She has been warned previously about breast cancer recurrence in places like brain or lung, and therefore finds her facial sensory changes to be somewhat alarming.    Current migraine therapies:  Rizatriptan  Topiramate  Venlafaxine  Botox (has had three sessions, with her ENT)    Previous migraine therapies:  Sumatriptan - Effective but mildly bothersome side effects  Amitriptyline - not effective  Metoprolol - not effective     Medical History:   Diagnosis Date   ? Acid reflux     chemo induced   ? Anxiety disorder    ? Gastrointestinal disorder    ? History of chemotherapy 2017   ? Limb alert care status 6/16    right limb alert   ? 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 Migraines    ? Other dysphagia ? PONV (postoperative nausea and vomiting)      Surgical History:   Procedure Laterality Date   ? LIPOMA RESECTION  2004    back   ? BREAST BIOPSY Right 09/11/14   ? BILATERAL NIPPLE-SPARING MASTECTOMIES, RIGHT SENTINEL LYMPH NODE BIOPSY Bilateral 10/22/2014    Performed by Cordelia Poche, MD at [facility] OR   ? INSERTION TISSUE EXPANDER BREAST Bilateral 10/22/2014    Performed by Corliss Skains, MD at [facility]   ? INSERTION VENOUS ACCESS PORT Left 10/22/2014    Performed by Simonne Martinet, MD at [facility] OR   ? TUNNELED VENOUS PORT REMOVAL  2017   ? RECONSTRUCTION BREAST/ BILATERAL TISSUE EXPANDER REMOVAL/ SILICONE IMPLANT PLACEMENT, PORT REMOVAL Bilateral 05/04/2015    Performed by Corliss Skains, MD at [facility] OR   ? BILATERAL CAPSULOTOMIES Bilateral 05/04/2015    Performed by Corliss Skains, MD at [facility] OR   ? INSERTION FAT GRAFT BREAST FROM ABDOMEN Bilateral 10/14/2015    Performed by Corliss Skains, MD at [facility] OR   ? LEFT BREAST MOUND REVISION WITH FAT GRAFTING; RIGHT BREAST RECONSTRUCTION WITH FAT GRAFTING Bilateral 04/01/2016    Performed by Loney Laurence,  Pearletha Furl, MD at [facility] OR   ? Bilateral fat grafting from abdomen and thighs Bilateral 04/01/2016    Performed by Corliss Skains, MD at [facility] OR   ? BILATERAL BREAST REVISION WITH CASULOTOMY AND IMPLANT EXCHANGE Bilateral 10/12/2016    Performed by Corliss Skains, MD at [facility] OR   ? LIPOSUCTION TO AXILLARY FOLDS Bilateral 10/12/2016    Performed by Corliss Skains, MD at [facility] OR   ? TISSUE GRAFT - Bilateral breast fat grafting from abdominal donor site Bilateral 07/26/2017    Performed by Corliss Skains, MD at [facility] OR   ? PERIPROSTHETIC CAPSULECTOMY BREAST-Left lateral capsulectomy with ADM support Left 07/26/2017    Performed by Loney Laurence, Pearletha Furl, MD at [facility] OR   ? REVISION RECONSTRUCTED BREAST Right 07/26/2017    Performed by Corliss Skains, MD at [facility] OR   ? GRAFTING AUTOLOGOUS FAT BY LIPOSUCTION TO BREASTS LEFT 165 CC, RIGHT 168 CC TOTAL 333 CC Bilateral 03/15/2019    Performed by Corliss Skains, MD at [facility] OR   ? GRAFTING AUTOLOGOUS FAT BY LIPOSUCTION TO BREASTS LEFT 165 CC, RIGHT 168 CC TOTAL 333 CC Bilateral 03/15/2019    Performed by Corliss Skains, MD at [facility] OR   ? INSERTION SIENTRA [id number] BREAST PROSTHESIS IN RECONSTRUCTION - IMMEDIATE Bilateral 03/15/2019    Performed by Corliss Skains, MD at [facility] OR   ? GRAFTING AUTOLOGOUS FAT BY LIPOSUCTION TO TRUNK/ BREASTS/ SCALP/ ARMS/ LEGS - 50 CC OR LESS Bilateral 12/09/2019    Performed by Corliss Skains, MD at [facility] OR   ? GRAFTING AUTOLOGOUS FAT BY LIPOSUCTION TO TRUNK/ BREASTS/ SCALP/ ARMS/ LEGS - EACH ADDITIONAL 50 CC Bilateral 12/09/2019    Performed by Corliss Skains, MD at [facility] OR   ? ADJACENT TISSUE TRANSFER/ REARRANGEMENT 10 SQ CM OR LESS - TORSO Right 12/09/2019    Performed by Corliss Skains, MD at [facility] OR     Family History   Problem Relation Age of Onset   ? High Cholesterol Mother    ? Arthritis-rheumatoid Mother    ? Arthritis-rheumatoid Maternal Aunt    ? Cancer-Lung Maternal Grandmother    ? Arthritis-rheumatoid Maternal Grandmother    ? Cancer-Lung Maternal Grandfather    ? Cancer-Uterine Paternal Grandmother    ? Arthritis-rheumatoid Paternal Grandmother    ? Cancer-Prostate Paternal Grandfather    ? Diabetes Paternal Grandfather      Social History     Tobacco Use   ? Smoking status: Former Smoker     Packs/day: 0.25     Years: 4.00     Pack years: 1.00     Types: Cigarettes     Quit date: 08/10/2005     Years since quitting: 14.6   ? Smokeless tobacco: Never Used   Vaping Use   ? Vaping Use: Never used   Substance Use Topics   ? Alcohol use: Not Currently     Alcohol/week: 0.0 standard drinks     Comment: rarely   ? Drug use: No          Review of Systems   Constitutional: Negative.    HENT: Negative.    Eyes: Negative.    Respiratory: Negative.    Cardiovascular: Negative.    Gastrointestinal: Negative.    Genitourinary: Negative. Musculoskeletal: Negative.    Skin: Negative.    Neurological: Positive for numbness and headaches.   Psychiatric/Behavioral: Negative.  Objective:         ? diazePAM (VALIUM) 5 mg tablet Take one tablet by mouth every 8 hours as needed for Anxiety. Indications: muscle spasm   ? gabapentin (NEURONTIN) 100 mg capsule Take two capsules by mouth every morning.   ? gabapentin (NEURONTIN) 300 mg capsule TAKE 3 CAPSULES BY MOUTH AT BEDTIME DAILY.   ? GEMTESA 75 mg tablet    ? goserelin (ZOLADEX) 3.6 mg implant syringe Inject 3.6 mg under the skin every 28 days.   ? ibuprofen (MOTRIN) 800 mg tablet TID PRN   ? levocetirizine (XYZAL) 5 mg tablet Take 5 mg by mouth.   ? lisinopriL (ZESTRIL) 20 mg tablet Take 20 mg by mouth daily.   ? naproxen (NAPROSYN) 250 mg tablet Q8H   ? ondansetron HCL (ZOFRAN) 4 mg tablet Take one tablet by mouth every 8 hours.   ? rizatriptan benzoate (RIZATRIPTAN PO) Take  by mouth.   ? SUMATRIPTAN SUCCINATE PO Take  by mouth.   ? tamoxifen (NOLVADEX) 20 mg tablet TAKE ONE TABLET BY MOUTH ONCE DAILY   ? topiramate (TOPAMAX) 100 mg tablet Take 100 mg by mouth every 12 hours.   ? venlafaxine XR (EFFEXOR XR) 37.5 mg capsule TAKE 1 CAPSULE BY MOUTH DAILY. TAKE WITH FOOD.   ? venlafaxine XR (EFFEXOR XR) 75 mg capsule TAKE 1 CAPSULE BY MOUTH DAILY. TAKE WITH FOOD.   ? zolpidem (AMBIEN) 10 mg tablet TAKE 1 TABLET BY MOUTH at bedtime     Vitals:    04/02/20 0957   BP: 124/76   BP Source: Arm, Left Upper   Patient Position: Sitting   Pulse: 97   SpO2: 99%   Weight: 93.4 kg (206 lb)   Height: 167.6 cm (65.98)   PainSc: Zero     Body mass index is 33.27 kg/m?Marland Kitchen     Physical Exam  Constitutional: Well developed female in no acute distress  See vital signs above    Eyes:  Optic disc has distinct margins    No retinal vessel dilation or hemorrhage observed    Mental Status: Oriented to person, place, and time    Recent and remote memory intact  Normal attention span and concentration  Normal speech  Fund of knowledge is satisfactory    Cranial Nerves:   CN II Visual fields intact; see fundoscopic exam   CN III, IV, VI Extraocular movements intact   CN V Facial sensation intact to light touch and PP, but feels tingly, primarily in Left V1   CN VII Face is symmetric; strong eye closure   CN VIII Hearing intact to finger rub   CN IX Symmetric palatal elevation   CN XI Shoulder shrug  R 5  L 5   CN XII Tongue midline on protrusion       Motor/Muscle Strength:   Right Left  Right Left   Shoulder abductors: 5 5 Hip flexion: 5 5   Elbow flexors: 5 5 Hip abduction:     Elbow extensors: 5 5 Hip Adduction:     Wrist extensors: 5 5 Knee extension: 5 5   Wrist flexors:   Knee flexion: 5 5   Finger flexors:   Ankle dorsiflexion: 5 5   Finger extensors: 5 5 Ankle plantar flexion: 5 5   Finger abductors: 5 5 Ankle eversion:     Thumb abductors:   Ankle inversion:        Toe extension:        Toe  flexion:         Reflexes:    Right   Left     Brachioradialis   2 2   Biceps   2 2   Triceps 2 2   Knee 2 2   Ankle 1 1     Sensation:  Light touch intact throughout    Pinprick intact throughout except for fingertips and toes bilaterally    Coordination:  Normal finger to nose bilaterally    No dysmetria    Gait:  Normal stance and stride           Assessment and Plan:    Chronic Migraine  Headache features as detailed above are consistent with migraine without aura.  She currently has more than 15 headache days per month, despite her current migraine medication regimen.  Paraesthesias of the Face  1 year history of caterpillar-like sensation in the left > right face, which is most likely related to her uncontrolled migraines, but given her past medical history, brain imaging is warranted.  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  Reasonably controlled with gabapentin.      Plan:  - MRI Head W+WO to evaluate her new facial paraesthesias, which is especially necessary in the setting of prior breast cancer  - For preventive therapy, we will start Emgality monthly injections.  She will also continue Botox, topiramate, and venlafaxine.  She would like to continue Botox injections here.  - For acute therapy, she will continue rizatriptan  - Return in 3 months for next Botox injection       Domingo Pulse MD  Neurology PGY3    ATTESTATION    I personally performed the key portions of the E/M visit, discussed case with resident and concur with resident documentation of history, physical exam, assessment, and treatment plan unless otherwise noted.     Chronic migraine with daily headache frequency. Insufficient benefit to multiple traditional medications and botox. Has change in headache character with associated facial tingling and numbness.    Plan:  1. Obtain MRI head w/wo contrast to evaluate change in headache character.  2. Start emgality. Load with 240mg , then 120mg  monthly. Plan for 3 month trial.  3. Continue botox, will transfer to our clinic. If no benefit on emgality will consider increased botox above 155 units.  4. Follow up for next botox.    Total Time Today was 30 minutes in the following activities: Preparing to see the patient, Obtaining and/or reviewing separately obtained history, Performing a medically appropriate examination and/or evaluation, Counseling and educating the patient/family/caregiver, Ordering medications, tests, or procedures and Documenting clinical information in the electronic or other health record      Staff name:  Romana Juniper, MD Date:  04/02/2020

## 2020-04-24 ENCOUNTER — Encounter: Admit: 2020-04-24 | Discharge: 2020-04-24 | Payer: BC Managed Care – PPO

## 2020-04-24 ENCOUNTER — Ambulatory Visit: Admit: 2020-04-24 | Discharge: 2020-04-24 | Payer: BC Managed Care – PPO

## 2020-04-24 DIAGNOSIS — G43719 Chronic migraine without aura, intractable, without status migrainosus: Secondary | ICD-10-CM

## 2020-04-24 MED ORDER — GADOBENATE DIMEGLUMINE 529 MG/ML (0.1MMOL/0.2ML) IV SOLN
20 mL | Freq: Once | INTRAVENOUS | 0 refills | Status: CP
Start: 2020-04-24 — End: ?
  Administered 2020-04-24: 22:00:00 20 mL via INTRAVENOUS

## 2020-04-27 ENCOUNTER — Encounter: Admit: 2020-04-27 | Discharge: 2020-04-27 | Payer: BC Managed Care – PPO

## 2020-04-27 NOTE — Telephone Encounter
Called PT and left a message with the [place] number.  She wanted to reschedule her appt on 05/01/20 to a later time in the day.  Nothing is available per [place].

## 2020-04-30 ENCOUNTER — Encounter: Admit: 2020-04-30 | Discharge: 2020-04-30 | Payer: BC Managed Care – PPO

## 2020-04-30 DIAGNOSIS — C50411 Malignant neoplasm of upper-outer quadrant of right female breast: Secondary | ICD-10-CM

## 2020-04-30 MED ORDER — LIDOCAINE (PF) 10 MG/ML (1 %) IJ SOLN
2 mL | Freq: Once | SUBCUTANEOUS | 0 refills | Status: CP
Start: 2020-04-30 — End: ?
  Administered 2020-04-30: 22:00:00 2 mL via SUBCUTANEOUS

## 2020-04-30 MED ORDER — GOSERELIN 3.6 MG SC IMPL
3.6 mg | Freq: Once | SUBCUTANEOUS | 0 refills | Status: CP
Start: 2020-04-30 — End: ?
  Administered 2020-04-30: 22:00:00 3.6 mg via SUBCUTANEOUS

## 2020-04-30 NOTE — Progress Notes
Patient received zoladex and tolerated without difficulty.  Patient denies questions or concerns and left treatment ambulatory, alert and oriented.

## 2020-05-01 ENCOUNTER — Encounter: Admit: 2020-05-01 | Discharge: 2020-05-01 | Payer: BC Managed Care – PPO

## 2020-05-15 MED ORDER — EMGALITY PEN 120 MG/ML SC PNIJ
0 refills
Start: 2020-05-15 — End: ?

## 2020-05-29 ENCOUNTER — Encounter: Admit: 2020-05-29 | Discharge: 2020-05-29 | Payer: BC Managed Care – PPO

## 2020-05-29 DIAGNOSIS — C50411 Malignant neoplasm of upper-outer quadrant of right female breast: Secondary | ICD-10-CM

## 2020-05-29 MED ORDER — LIDOCAINE (PF) 10 MG/ML (1 %) IJ SOLN
2 mL | Freq: Once | SUBCUTANEOUS | 0 refills | Status: CP
Start: 2020-05-29 — End: ?
  Administered 2020-05-29: 21:00:00 2 mL via SUBCUTANEOUS

## 2020-05-29 MED ORDER — GOSERELIN 3.6 MG SC IMPL
3.6 mg | Freq: Once | SUBCUTANEOUS | 0 refills | Status: CP
Start: 2020-05-29 — End: ?
  Administered 2020-05-29: 21:00:00 3.6 mg via SUBCUTANEOUS

## 2020-05-29 NOTE — Patient Instructions
[name]  Chemotherapy Instructions    Debra Fleming 05/29/2020    Chemotherapy Drugs:      Zoladex    Call Immediately to report the following:  Unexplained bleeding or bleeding that will not stop  Difficulty swallowing  Shortness of breath, wheezing, or trouble breathing  Rapid, irregular heartbeat; chest pain  Dizziness, lightheadedness  Rash or cut that swells or turns red, feels hot or painful, or begin to ooze  Diarrhea   Uncontrolled nausea or vomiting  Fever of 100.4 F or higher, or chills    Important Phone Numbers:  [place] Number (answered 24 hours a day) [place] (appointments) [phone]  Social Worker [phone]

## 2020-05-29 NOTE — Progress Notes
Patient arrived to [facility] treatment for Zoladex. VS stable. Patient received Zoladex and tolerated without difficulty. Schedule reviewed with patient. Pt left clinic without further questions or concerns. - Phill Myron, RN

## 2020-06-08 ENCOUNTER — Encounter: Admit: 2020-06-08 | Discharge: 2020-06-08 | Payer: BC Managed Care – PPO

## 2020-06-08 MED ORDER — GABAPENTIN 300 MG PO CAP
900 mg | ORAL_CAPSULE | Freq: Every evening | ORAL | 3 refills | Status: AC
Start: 2020-06-08 — End: ?

## 2020-06-08 MED ORDER — GABAPENTIN 100 MG PO CAP
ORAL_CAPSULE | Freq: Three times a day (TID) | 3 refills | Status: AC
Start: 2020-06-08 — End: ?

## 2020-06-16 ENCOUNTER — Encounter: Admit: 2020-06-16 | Discharge: 2020-06-16 | Payer: BC Managed Care – PPO

## 2020-06-18 ENCOUNTER — Encounter: Admit: 2020-06-18 | Discharge: 2020-06-18 | Payer: BC Managed Care – PPO

## 2020-06-18 ENCOUNTER — Ambulatory Visit: Admit: 2020-06-18 | Discharge: 2020-06-19 | Payer: BC Managed Care – PPO

## 2020-06-18 DIAGNOSIS — R1319 Other dysphagia: Secondary | ICD-10-CM

## 2020-06-18 DIAGNOSIS — Z9221 Personal history of antineoplastic chemotherapy: Secondary | ICD-10-CM

## 2020-06-18 DIAGNOSIS — R112 Nausea with vomiting, unspecified: Secondary | ICD-10-CM

## 2020-06-18 DIAGNOSIS — K929 Disease of digestive system, unspecified: Secondary | ICD-10-CM

## 2020-06-18 DIAGNOSIS — C50911 Malignant neoplasm of unspecified site of right female breast: Secondary | ICD-10-CM

## 2020-06-18 DIAGNOSIS — F419 Anxiety disorder, unspecified: Secondary | ICD-10-CM

## 2020-06-18 DIAGNOSIS — K219 Gastro-esophageal reflux disease without esophagitis: Secondary | ICD-10-CM

## 2020-06-18 DIAGNOSIS — G43709 Chronic migraine without aura, not intractable, without status migrainosus: Secondary | ICD-10-CM

## 2020-06-18 DIAGNOSIS — G43909 Migraine, unspecified, not intractable, without status migrainosus: Secondary | ICD-10-CM

## 2020-06-18 DIAGNOSIS — Z789 Other specified health status: Secondary | ICD-10-CM

## 2020-06-18 MED ORDER — EMGALITY PEN 120 MG/ML SC PNIJ
120 mg | SUBCUTANEOUS | 1 refills | Status: AC
Start: 2020-06-18 — End: ?

## 2020-06-18 MED ORDER — ONABOTULINUMTOXINA 100 UNIT IJ SOLR
200 [IU] | Freq: Once | INTRAMUSCULAR | 0 refills | Status: CP
Start: 2020-06-18 — End: ?
  Administered 2020-06-18: 18:00:00 175 [IU] via INTRAMUSCULAR

## 2020-06-18 NOTE — Progress Notes
Subjective: Debra Fleming is a 35 y.o. female here today for Botox for chronic migraines. Prior to injections, they experienced 20+ headache days per month with an average duration of 4+ hours. They are located holocephalic. They are associated with nausea, photophobia, phonophobia. Pain scale 5+/10. Modalities for relief tried rizatriptan, topiramate, venlafaxine, botox, sumatriptan, amitriptyline, metoprolol.    Currently they have 1-2 headache days a month, lasting on average 4= hours.    Do you understand the risks of botox as discussed in the consent and wish to proceed?   Patient states yes    Are you currently pregnant or trying to become pregnant, as botox is not indicated while pregnant? No    Vitals:    06/18/20 1015   BP: 122/83   BP Source: Arm, Left Upper   Patient Position: Sitting   Pulse: 108   SpO2: 96%   Height: 167.6 cm (5' 5.98)   PainSc: Zero       Botox Injection Procedure  Previous Injection Date: 12 weeks ago, outside provider    Information given verbally to the patient  today  included, but was not limited to:    Most common side effects: neck pain, headache, eyelid ptosis, migraine, muscular weakness, musculoskeletal stiffness, bronchitis, injection-site pain, musculoskeletal pain, myalgia, facial paresis, hypertension, muscle spasms; infection at injections sites, bruising, bleeding    Most serious side effects/risks: anaphylaxis, dysphagia,  arrhythmia, myocardial infarction, and in some cases, spontaneous death    Consent form was signed.    Confirmed: patient, procedure, side, site, safety procedures followed.     Performed by:  [facility]     Preparation: no contraindications noted to Botox, possible medications prior to procedure:  topical numbing cream prior to procedure, Preparation of site with alcohol    Procedure performed:   Indication: Chronic Migraine Headaches  Medication: Onabotulinum toxin A  2.86ml normal saline as diluent /100 units (Botulinum Toxin 5 units per 0.37mL, 200 units prepared, 155 units used, 45 units of waste)    Location: PREEMPT protocol Jerrell Belfast SK, and coauthors. Cephalalgia, 2010;30:793)  Muscles/Sites Injected  A - Bilateral Corrugator - 10 units divided in 2 sites  B - Midline Procerus - 5 units in 1 site  C - Bilateral Frontalis - 20 units divided in 4 sites  D - Bilateral Temporalis - 40 units divided in 8 sites  E - Bilateral Occipitalis - 30 units divided in 6 sites, additional 10 units divided in 2 sites  F - Bilateral Cervical Paraspinals - 20 units divided in 4 sites  G - Bilateral Trapezius - 30 units divided in 6 sites, additional 10 units divided in 2 sites    Total Dose - 175 Units divided in 35 sites    Lot/Expiration: [id number] 04/2022 (both vials)    Procedure tolerated: well.    Complications: none.  Diagnosis - Chronic Migraine Headaches   Course:  Progressing as expected.    Counseled: Patient/Family, Regarding diagnosis, Regarding treatment, Regarding medications. If any serious side effects occur, the patient has been instructed to go to the nearest emergency room and call our office.    Follow up: as scheduled prior to next Botox administration.

## 2020-06-19 DIAGNOSIS — G43719 Chronic migraine without aura, intractable, without status migrainosus: Principal | ICD-10-CM

## 2020-06-23 ENCOUNTER — Encounter: Admit: 2020-06-23 | Discharge: 2020-06-23 | Payer: BC Managed Care – PPO

## 2020-07-02 ENCOUNTER — Encounter: Admit: 2020-07-02 | Discharge: 2020-07-02 | Payer: BC Managed Care – PPO

## 2020-07-02 DIAGNOSIS — C50411 Malignant neoplasm of upper-outer quadrant of right female breast: Secondary | ICD-10-CM

## 2020-07-02 MED ORDER — LIDOCAINE (PF) 10 MG/ML (1 %) IJ SOLN
2 mL | Freq: Once | SUBCUTANEOUS | 0 refills | Status: CP
Start: 2020-07-02 — End: ?
  Administered 2020-07-02: 21:00:00 2 mL via SUBCUTANEOUS

## 2020-07-02 MED ORDER — GOSERELIN 3.6 MG SC IMPL
3.6 mg | Freq: Once | SUBCUTANEOUS | 0 refills | Status: CP
Start: 2020-07-02 — End: ?
  Administered 2020-07-02: 21:00:00 3.6 mg via SUBCUTANEOUS

## 2020-07-02 NOTE — Progress Notes
Patient received 3.6mg  Zoladex subcutaneously in LLQ and tolerated without difficulty.  No pertinent changes since last assessment.

## 2020-07-08 ENCOUNTER — Encounter: Admit: 2020-07-08 | Discharge: 2020-07-08 | Payer: BC Managed Care – PPO

## 2020-07-08 MED ORDER — ZOLPIDEM 10 MG PO TAB
ORAL_TABLET | Freq: Every evening | ORAL | 4 refills | 30.00000 days | Status: AC
Start: 2020-07-08 — End: ?

## 2020-07-20 ENCOUNTER — Encounter: Admit: 2020-07-20 | Discharge: 2020-07-20 | Payer: BC Managed Care – PPO

## 2020-07-21 ENCOUNTER — Encounter: Admit: 2020-07-21 | Discharge: 2020-07-21 | Payer: BC Managed Care – PPO

## 2020-07-22 ENCOUNTER — Encounter: Admit: 2020-07-22 | Discharge: 2020-07-22 | Payer: BC Managed Care – PPO

## 2020-07-22 DIAGNOSIS — Z421 Encounter for breast reconstruction following mastectomy: Secondary | ICD-10-CM

## 2020-07-22 DIAGNOSIS — C50411 Malignant neoplasm of upper-outer quadrant of right female breast: Secondary | ICD-10-CM

## 2020-07-24 ENCOUNTER — Encounter: Admit: 2020-07-24 | Discharge: 2020-07-24 | Payer: BC Managed Care – PPO

## 2020-07-24 NOTE — Telephone Encounter
Debra Fleming is going to have implants removed and a tummy tuck May 23. I have asked her to stop Tamoxifen 07/27/20 to prepare for the surgery and will hold until 2 weeks after surgery due to increased risk of blood clot.     Can keep zoladex scheduled at this time, but if she is not fully healed ok to move out next zoladex injection.

## 2020-07-28 ENCOUNTER — Ambulatory Visit: Admit: 2020-07-28 | Discharge: 2020-07-29 | Payer: BC Managed Care – PPO

## 2020-07-28 ENCOUNTER — Encounter: Admit: 2020-07-28 | Discharge: 2020-07-28 | Payer: BC Managed Care – PPO

## 2020-07-28 DIAGNOSIS — K929 Disease of digestive system, unspecified: Secondary | ICD-10-CM

## 2020-07-28 DIAGNOSIS — Z421 Encounter for breast reconstruction following mastectomy: Secondary | ICD-10-CM

## 2020-07-28 DIAGNOSIS — C50411 Malignant neoplasm of upper-outer quadrant of right female breast: Secondary | ICD-10-CM

## 2020-07-28 DIAGNOSIS — K219 Gastro-esophageal reflux disease without esophagitis: Secondary | ICD-10-CM

## 2020-07-28 DIAGNOSIS — Z789 Other specified health status: Secondary | ICD-10-CM

## 2020-07-28 DIAGNOSIS — C50911 Malignant neoplasm of unspecified site of right female breast: Secondary | ICD-10-CM

## 2020-07-28 DIAGNOSIS — R112 Nausea with vomiting, unspecified: Secondary | ICD-10-CM

## 2020-07-28 DIAGNOSIS — G43909 Migraine, unspecified, not intractable, without status migrainosus: Secondary | ICD-10-CM

## 2020-07-28 DIAGNOSIS — F419 Anxiety disorder, unspecified: Secondary | ICD-10-CM

## 2020-07-28 DIAGNOSIS — R1319 Other dysphagia: Secondary | ICD-10-CM

## 2020-07-28 DIAGNOSIS — Z9221 Personal history of antineoplastic chemotherapy: Secondary | ICD-10-CM

## 2020-07-28 MED ORDER — TRAMADOL 50 MG PO TAB
100 mg | ORAL_TABLET | ORAL | 0 refills | Status: AC | PRN
Start: 2020-07-28 — End: ?

## 2020-07-28 MED ORDER — LIDOCAINE (PF) 10 MG/ML (1 %) IJ SOLN
2 mL | Freq: Once | SUBCUTANEOUS | 0 refills | Status: CP
Start: 2020-07-28 — End: ?
  Administered 2020-07-28: 17:00:00 2 mL via SUBCUTANEOUS

## 2020-07-28 MED ORDER — GOSERELIN 3.6 MG SC IMPL
3.6 mg | Freq: Once | SUBCUTANEOUS | 0 refills | Status: CP
Start: 2020-07-28 — End: ?
  Administered 2020-07-28: 17:00:00 3.6 mg via SUBCUTANEOUS

## 2020-07-28 MED ORDER — ONDANSETRON HCL 4 MG PO TAB
4 mg | ORAL_TABLET | ORAL | 0 refills | 8.00000 days | Status: AC | PRN
Start: 2020-07-28 — End: ?
  Filled 2020-08-18: qty 10, 3d supply, fill #1

## 2020-07-28 MED ORDER — DOXYCYCLINE HYCLATE 100 MG PO TAB
100 mg | ORAL_TABLET | Freq: Two times a day (BID) | ORAL | 0 refills | 8.00000 days | Status: AC
Start: 2020-07-28 — End: ?

## 2020-07-28 MED ORDER — DIAZEPAM 5 MG PO TAB
5 mg | ORAL_TABLET | ORAL | 0 refills | 7.00000 days | Status: AC | PRN
Start: 2020-07-28 — End: ?

## 2020-07-28 NOTE — Progress Notes
Date of Service: 07/28/2020    Subjective:             Debra Fleming is a 35 y.o. female presents today for her preop visit.  She is scheduled with Dr. Loney Laurence May 23rd for Abdominoplasty with Liposuction; Bilateral Capsulectomy and Removal of Implants; Bilateral Chest Fat Grafting.  Debra Fleming denies any recent colds or fevers.  She is looking forward to being flat from her chest to her abdomen.  Today she is uncertain whether or not she would like to keep her nipple/areola complexes.  She also reports having had reactions to the adhesive tape of the drapes with all of her previous surgeries.      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    HENT: Negative.    Eyes: Negative.    Respiratory: Negative.    Cardiovascular: Negative.    Gastrointestinal: Negative.    Endocrine: Negative.    Genitourinary: Negative.    Musculoskeletal: Negative.    Skin: Negative.    Allergic/Immunologic: Negative.    Neurological: Negative.    Hematological: Negative.    Psychiatric/Behavioral: Negative.      Objective:         ? ALPRAZolam (XANAX) 0.5 mg tablet TAKE 1 TABLET BY MOUTH TWICE DAILY as needed for anxiety   ? diazePAM (VALIUM) 5 mg tablet Take one tablet by mouth every 8 hours as needed for Anxiety. Indications: muscle spasm   ? gabapentin (NEURONTIN) 100 mg capsule take 2 capsules BY MOUTH EVERY 8 HOURS   ? gabapentin (NEURONTIN) 300 mg capsule take 3 capsules BY MOUTH at bedtime DAILY   ? galcanezumab-gnlm (EMGALITY PEN) 120 mg/mL subcutaneous PEN Inject 1 mL under the skin every 30 days.   ? GEMTESA 75 mg tablet    ? goserelin (ZOLADEX) 3.6 mg implant syringe Inject 3.6 mg under the skin every 28 days.   ? ibuprofen (MOTRIN) 800 mg tablet TID PRN   ? levocetirizine (XYZAL) 5 mg tablet Take 5 mg by mouth.   ? lisinopriL (ZESTRIL) 20 mg tablet Take 20 mg by mouth daily.   ? naproxen (NAPROSYN) 250 mg tablet Q8H   ? ondansetron HCL (ZOFRAN) 4 mg tablet Take one tablet by mouth every 8 hours.   ? rizatriptan benzoate (RIZATRIPTAN PO) Take  by mouth.   ? SUMATRIPTAN SUCCINATE PO Take  by mouth.   ? tamoxifen (NOLVADEX) 20 mg tablet TAKE ONE TABLET BY MOUTH ONCE DAILY   ? topiramate (TOPAMAX) 100 mg tablet Take 100 mg by mouth every 12 hours.   ? venlafaxine XR (EFFEXOR XR) 37.5 mg capsule TAKE 1 CAPSULE BY MOUTH DAILY. TAKE WITH FOOD.   ? venlafaxine XR (EFFEXOR XR) 75 mg capsule TAKE 1 CAPSULE BY MOUTH DAILY. TAKE WITH FOOD.   ? zolpidem (AMBIEN) 10 mg tablet TAKE 1 TABLET BY MOUTH at bedtime     There were no vitals filed for this visit.  There is no height or weight on file to calculate BMI.  Physical Exam  Vitals and nursing note reviewed.   Constitutional:       Appearance: She is obese.   HENT:      Head: Normocephalic.   Chest:   Breasts:      Right: Absent.      Left: Absent.        Comments: Moderate thick flap  Current position good  Abdominal:      General: Abdomen is protuberant.      Comments: Skin laxity  Moderate adipose   Neurological:      Mental Status: She is oriented to person, place, and time.   Psychiatric:         Behavior: Behavior normal.          Assessment and Plan:  Plan:  abdominoplasty with liposuction, bilateral capsulectomy, removal of implants, bilateral breast fat grafting with abdominal flank donor sites  Discussed pre and post operative instructions with patient.  Risks and benefits of surgery reviewed with patient; questions answered.  Consent signed today.  Post-op scripts given for Tramadol, Zofran, Valium and Doxycycline.  Patient instructed to start Tylenol the evening prior to surgery and then continue on Tylenol for a total of 5 days post op.    Plan for an extended recovery.

## 2020-07-28 NOTE — Progress Notes
Patient received Zoladex in RLQ and tolerated without difficulty.  No pertinent changes since last assessment. Pt discharged ambulatory in stable condition.

## 2020-07-29 ENCOUNTER — Encounter: Admit: 2020-07-29 | Discharge: 2020-07-29 | Payer: BC Managed Care – PPO

## 2020-07-29 NOTE — Telephone Encounter
called pt to discuss payment for upcoming surgery. I will call pt back Monday to collect.

## 2020-08-03 ENCOUNTER — Encounter: Admit: 2020-08-03 | Discharge: 2020-08-03 | Payer: BC Managed Care – PPO

## 2020-08-03 DIAGNOSIS — Z789 Other specified health status: Secondary | ICD-10-CM

## 2020-08-03 DIAGNOSIS — R1319 Other dysphagia: Secondary | ICD-10-CM

## 2020-08-03 DIAGNOSIS — F419 Anxiety disorder, unspecified: Secondary | ICD-10-CM

## 2020-08-03 DIAGNOSIS — Z9221 Personal history of antineoplastic chemotherapy: Secondary | ICD-10-CM

## 2020-08-03 DIAGNOSIS — K929 Disease of digestive system, unspecified: Secondary | ICD-10-CM

## 2020-08-03 DIAGNOSIS — K219 Gastro-esophageal reflux disease without esophagitis: Secondary | ICD-10-CM

## 2020-08-03 DIAGNOSIS — G43909 Migraine, unspecified, not intractable, without status migrainosus: Secondary | ICD-10-CM

## 2020-08-03 DIAGNOSIS — R112 Nausea with vomiting, unspecified: Secondary | ICD-10-CM

## 2020-08-03 DIAGNOSIS — C50911 Malignant neoplasm of unspecified site of right female breast: Secondary | ICD-10-CM

## 2020-08-06 ENCOUNTER — Encounter: Admit: 2020-08-06 | Discharge: 2020-08-06 | Payer: BC Managed Care – PPO

## 2020-08-16 ENCOUNTER — Encounter: Admit: 2020-08-16 | Discharge: 2020-08-16 | Payer: BC Managed Care – PPO

## 2020-08-17 ENCOUNTER — Encounter: Admit: 2020-08-17 | Discharge: 2020-08-17 | Payer: BC Managed Care – PPO

## 2020-08-17 DIAGNOSIS — K219 Gastro-esophageal reflux disease without esophagitis: Secondary | ICD-10-CM

## 2020-08-17 DIAGNOSIS — K929 Disease of digestive system, unspecified: Secondary | ICD-10-CM

## 2020-08-17 DIAGNOSIS — Z789 Other specified health status: Secondary | ICD-10-CM

## 2020-08-17 DIAGNOSIS — G43909 Migraine, unspecified, not intractable, without status migrainosus: Secondary | ICD-10-CM

## 2020-08-17 DIAGNOSIS — R1319 Other dysphagia: Secondary | ICD-10-CM

## 2020-08-17 DIAGNOSIS — R112 Nausea with vomiting, unspecified: Secondary | ICD-10-CM

## 2020-08-17 DIAGNOSIS — Z9221 Personal history of antineoplastic chemotherapy: Secondary | ICD-10-CM

## 2020-08-17 DIAGNOSIS — C50911 Malignant neoplasm of unspecified site of right female breast: Secondary | ICD-10-CM

## 2020-08-17 DIAGNOSIS — F419 Anxiety disorder, unspecified: Secondary | ICD-10-CM

## 2020-08-17 MED ORDER — HYDROMORPHONE (PF) 2 MG/ML IJ SYRG
INTRAVENOUS | 0 refills | Status: DC
Start: 2020-08-17 — End: 2020-08-17
  Administered 2020-08-17: 19:00:00 .25 mg via INTRAVENOUS
  Administered 2020-08-17: 15:00:00 .5 mg via INTRAVENOUS
  Administered 2020-08-17 (×2): .25 mg via INTRAVENOUS

## 2020-08-17 MED ORDER — SUGAMMADEX 100 MG/ML IV SOLN
INTRAVENOUS | 0 refills | Status: DC
Start: 2020-08-17 — End: 2020-08-17
  Administered 2020-08-17: 19:00:00 200 mg via INTRAVENOUS

## 2020-08-17 MED ORDER — DEXMEDETOMIDINE IN 0.9 % NACL 20 MCG/5 ML (4 MCG/ML) IV SYRG
INTRAVENOUS | 0 refills | Status: DC
Start: 2020-08-17 — End: 2020-08-17
  Administered 2020-08-17: 18:00:00 4 ug via INTRAVENOUS

## 2020-08-17 MED ORDER — LIDOCAINE (PF) 200 MG/10 ML (2 %) IJ SYRG
INTRAVENOUS | 0 refills | Status: DC
Start: 2020-08-17 — End: 2020-08-17
  Administered 2020-08-17: 15:00:00 40 mg via INTRAVENOUS

## 2020-08-17 MED ORDER — PROPOFOL 10 MG/ML IV EMUL 100 ML (INFUSION)(AM)(OR)
INTRAVENOUS | 0 refills | Status: DC
Start: 2020-08-17 — End: 2020-08-17
  Administered 2020-08-17: 15:00:00 150 ug/kg/min via INTRAVENOUS

## 2020-08-17 MED ORDER — ONDANSETRON HCL (PF) 4 MG/2 ML IJ SOLN
INTRAVENOUS | 0 refills | Status: DC
Start: 2020-08-17 — End: 2020-08-17
  Administered 2020-08-17 (×2): 4 mg via INTRAVENOUS

## 2020-08-17 MED ORDER — DEXAMETHASONE SODIUM PHOSPHATE 4 MG/ML IJ SOLN
INTRAVENOUS | 0 refills | Status: DC
Start: 2020-08-17 — End: 2020-08-17
  Administered 2020-08-17: 15:00:00 4 mg via INTRAVENOUS

## 2020-08-17 MED ORDER — MIDAZOLAM 1 MG/ML IJ SOLN
INTRAVENOUS | 0 refills | Status: DC
Start: 2020-08-17 — End: 2020-08-17
  Administered 2020-08-17: 15:00:00 2 mg via INTRAVENOUS

## 2020-08-17 MED ORDER — ROCURONIUM 10 MG/ML IV SOLN
INTRAVENOUS | 0 refills | Status: DC
Start: 2020-08-17 — End: 2020-08-17
  Administered 2020-08-17: 18:00:00 10 mg via INTRAVENOUS
  Administered 2020-08-17: 15:00:00 50 mg via INTRAVENOUS
  Administered 2020-08-17: 17:00:00 20 mg via INTRAVENOUS

## 2020-08-17 MED ORDER — PROPOFOL INJ 10 MG/ML IV VIAL
INTRAVENOUS | 0 refills | Status: DC
Start: 2020-08-17 — End: 2020-08-17
  Administered 2020-08-17: 15:00:00 200 mg via INTRAVENOUS

## 2020-08-17 MED ORDER — FENTANYL CITRATE (PF) 50 MCG/ML IJ SOLN
INTRAVENOUS | 0 refills | Status: DC
Start: 2020-08-17 — End: 2020-08-17
  Administered 2020-08-17: 15:00:00 100 ug via INTRAVENOUS
  Administered 2020-08-17: 17:00:00 50 ug via INTRAVENOUS

## 2020-08-17 MED ORDER — ARTIFICIAL TEARS (PF) SINGLE DOSE DROPS GROUP
OPHTHALMIC | 0 refills | Status: DC
Start: 2020-08-17 — End: 2020-08-17
  Administered 2020-08-17: 15:00:00 2 [drp] via OPHTHALMIC

## 2020-08-17 MED ADMIN — LACTATED RINGERS IV SOLP [4318]: 1000.000 mL | INTRAVENOUS | @ 17:00:00 | Stop: 2020-08-19 | NDC 00338011704

## 2020-08-17 MED ADMIN — DIAZEPAM 5 MG PO TAB [2405]: 5 mg | ORAL | @ 20:00:00 | NDC 51079028501

## 2020-08-17 MED ADMIN — LACTATED RINGERS IV SOLP [4318]: 1000.000 mL | INTRAVENOUS | @ 20:00:00 | Stop: 2020-08-19 | NDC 00338011704

## 2020-08-17 MED ADMIN — BACITRACIN ZINC 500 UNIT/GRAM TP OINT [13818]: 1 | TOPICAL | @ 16:00:00 | Stop: 2020-08-17 | NDC 54162001715

## 2020-08-17 MED ADMIN — ACETAMINOPHEN 325 MG PO TAB [101]: 650 mg | ORAL | @ 23:00:00 | NDC 00904677361

## 2020-08-17 MED ADMIN — LACTATED RINGERS IV SOLP [4318]: 1000 mL | @ 16:00:00 | Stop: 2020-08-17 | NDC 00338011704

## 2020-08-17 MED ADMIN — BUPIVACAINE-EPINEPHRINE 0.25 %-1:200,000 IJ SOLN [14983]: 6 mL | INTRAMUSCULAR | @ 16:00:00 | Stop: 2020-08-17 | NDC 00409175250

## 2020-08-17 MED ADMIN — BUPIVACAINE-EPINEPHRINE 0.25 %-1:200,000 IJ SOLN [14983]: 30 mL | INTRAMUSCULAR | @ 15:00:00 | Stop: 2020-08-17 | NDC 00409175250

## 2020-08-17 MED ADMIN — LIDOCAINE-EPINEPHRINE 1 %-1:100,000 IJ SOLN [15955]: 836 mL | @ 16:00:00 | Stop: 2020-08-17 | NDC 00409317803

## 2020-08-17 MED ADMIN — SODIUM CHLORIDE 0.9 % IR SOLN [11403]: 1000 mL | @ 16:00:00 | Stop: 2020-08-17 | NDC 00338004804

## 2020-08-17 MED ADMIN — LACTATED RINGERS IV SOLP [4318]: 1000.000 mL | INTRAVENOUS | @ 14:00:00 | Stop: 2020-08-19 | NDC 00338011704

## 2020-08-17 MED ADMIN — CEFAZOLIN INJ 1GM IVP [210319]: 2 g | INTRAVENOUS | @ 15:00:00 | Stop: 2020-08-17 | NDC 44567070725

## 2020-08-17 MED ADMIN — MORPHINE 2 MG/ML IV SYRG [320653]: 4 mg | INTRAVENOUS | @ 21:00:00 | NDC 63323045200

## 2020-08-17 MED ADMIN — FENTANYL CITRATE (PF) 50 MCG/ML IJ SOLN [3037]: 25 ug | INTRAVENOUS | @ 19:00:00 | Stop: 2020-08-17 | NDC 63323080612

## 2020-08-17 MED ADMIN — BUPIVACAINE-EPINEPHRINE 0.25 %-1:200,000 IJ SOLN [14983]: 20 mL | INTRAMUSCULAR | @ 17:00:00 | Stop: 2020-08-17 | NDC 00409175250

## 2020-08-17 MED ADMIN — ACETAMINOPHEN 500 MG PO TAB [102]: 1000 mg | ORAL | @ 14:00:00 | Stop: 2020-08-17 | NDC 00904673061

## 2020-08-17 MED ADMIN — FENTANYL CITRATE (PF) 50 MCG/ML IJ SOLN [3037]: 25 ug | INTRAVENOUS | @ 20:00:00 | Stop: 2020-08-17 | NDC 63323080612

## 2020-08-17 MED ADMIN — LIDOCAINE-EPINEPHRINE 1 %-1:100,000 IJ SOLN [15955]: 1000 mL | @ 16:00:00 | Stop: 2020-08-17 | NDC 00409317803

## 2020-08-17 MED ADMIN — LACTATED RINGERS IV SOLP [4318]: 836 mL | @ 16:00:00 | Stop: 2020-08-17 | NDC 00338011704

## 2020-08-17 MED ADMIN — GABAPENTIN 100 MG PO CAP [18309]: 200 mg | ORAL | @ 21:00:00 | NDC 00904666561

## 2020-08-17 MED ADMIN — CEFAZOLIN INJ 1GM IVP [210319]: 2 g | INTRAVENOUS | @ 19:00:00 | Stop: 2020-08-17 | NDC 44567070725

## 2020-08-17 MED ADMIN — OXYCODONE 5 MG PO TAB [10814]: 5 mg | ORAL | @ 20:00:00 | Stop: 2020-08-17 | NDC 00904696661

## 2020-08-18 ENCOUNTER — Encounter: Admit: 2020-08-18 | Discharge: 2020-08-18 | Payer: BC Managed Care – PPO

## 2020-08-18 MED ADMIN — DIAZEPAM 5 MG PO TAB [2405]: 5 mg | ORAL | @ 16:00:00 | Stop: 2020-08-18 | NDC 51079028501

## 2020-08-18 MED ADMIN — WATER FOR INJECTION, STERILE IJ SOLN [79513]: 10 mL | INTRAVENOUS | @ 11:00:00 | Stop: 2020-08-18 | NDC 00409488723

## 2020-08-18 MED ADMIN — MORPHINE 2 MG/ML IV SYRG [320653]: 4 mg | INTRAVENOUS | @ 03:00:00 | NDC 63323045200

## 2020-08-18 MED ADMIN — CEFAZOLIN INJ 1GM IVP [210319]: 1 g | INTRAVENOUS | @ 01:00:00 | Stop: 2020-08-18 | NDC 60505614200

## 2020-08-18 MED ADMIN — GABAPENTIN 100 MG PO CAP [18309]: 200 mg | ORAL | @ 04:00:00 | NDC 00904666561

## 2020-08-18 MED ADMIN — ACETAMINOPHEN 325 MG PO TAB [101]: 650 mg | ORAL | @ 04:00:00 | NDC 00904677361

## 2020-08-18 MED ADMIN — OXYCODONE 5 MG PO TAB [10814]: 10 mg | ORAL | @ 07:00:00 | Stop: 2020-08-18 | NDC 00904696661

## 2020-08-18 MED ADMIN — POLYETHYLENE GLYCOL 3350 17 GRAM PO PWPK [25424]: 17 g | ORAL | @ 14:00:00 | Stop: 2020-08-18 | NDC 00904693186

## 2020-08-18 MED ADMIN — ONDANSETRON 4 MG PO TBDI [82394]: 4 mg | ORAL | @ 16:00:00 | Stop: 2020-08-18 | NDC 68462015740

## 2020-08-18 MED ADMIN — ACETAMINOPHEN 325 MG PO TAB [101]: 650 mg | ORAL | @ 14:00:00 | Stop: 2020-08-18 | NDC 00904677361

## 2020-08-18 MED ADMIN — OXYCODONE 5 MG PO TAB [10814]: 10 mg | ORAL | @ 14:00:00 | Stop: 2020-08-18 | NDC 00904696661

## 2020-08-18 MED ADMIN — OXYCODONE 5 MG PO TAB [10814]: 10 mg | ORAL | @ 01:00:00 | NDC 00904696661

## 2020-08-18 MED ADMIN — SENNOSIDES-DOCUSATE SODIUM 8.6-50 MG PO TAB [40926]: 2 | ORAL | @ 11:00:00 | Stop: 2020-08-18 | NDC 60687062211

## 2020-08-18 MED ADMIN — ONDANSETRON HCL (PF) 4 MG/2 ML IJ SOLN [136012]: 4 mg | INTRAVENOUS | @ 07:00:00 | Stop: 2020-08-18 | NDC 72266012301

## 2020-08-18 MED ADMIN — OXYCODONE 5 MG PO TAB [10814]: 10 mg | ORAL | @ 11:00:00 | Stop: 2020-08-18 | NDC 00904696661

## 2020-08-18 MED ADMIN — ACETAMINOPHEN 325 MG PO TAB [101]: 650 mg | ORAL | @ 11:00:00 | Stop: 2020-08-18 | NDC 00904677361

## 2020-08-18 MED ADMIN — CEFAZOLIN INJ 1GM IVP [210319]: 1 g | INTRAVENOUS | @ 11:00:00 | Stop: 2020-08-18 | NDC 60505614200

## 2020-08-18 MED ADMIN — GABAPENTIN 100 MG PO CAP [18309]: 200 mg | ORAL | @ 13:00:00 | Stop: 2020-08-18 | NDC 00904666561

## 2020-08-18 MED ADMIN — DIAZEPAM 5 MG PO TAB [2405]: 5 mg | ORAL | @ 09:00:00 | Stop: 2020-08-18 | NDC 51079028501

## 2020-08-18 MED ADMIN — WATER FOR INJECTION, STERILE IJ SOLN [79513]: 10 mL | INTRAVENOUS | @ 01:00:00 | Stop: 2020-08-18 | NDC 00409488723

## 2020-08-18 MED ADMIN — LACTATED RINGERS IV SOLP [4318]: 1000.000 mL | INTRAVENOUS | Stop: 2020-08-19 | NDC 00338011704

## 2020-08-18 MED ADMIN — ZOLPIDEM 5 MG PO TAB [81803]: 10 mg | ORAL | @ 04:00:00 | NDC 00904608261

## 2020-08-18 MED FILL — CEPHALEXIN 500 MG PO CAP: 500 mg | ORAL | 7 days supply | Qty: 28 | Fill #1 | Status: CP

## 2020-08-18 MED FILL — OXYCODONE 5 MG PO TAB: 5 mg | ORAL | 8 days supply | Qty: 30 | Fill #1 | Status: CP

## 2020-08-19 ENCOUNTER — Encounter: Admit: 2020-08-19 | Discharge: 2020-08-19 | Payer: BC Managed Care – PPO

## 2020-08-19 DIAGNOSIS — G43909 Migraine, unspecified, not intractable, without status migrainosus: Secondary | ICD-10-CM

## 2020-08-19 DIAGNOSIS — Z9221 Personal history of antineoplastic chemotherapy: Secondary | ICD-10-CM

## 2020-08-19 DIAGNOSIS — R1319 Other dysphagia: Secondary | ICD-10-CM

## 2020-08-19 DIAGNOSIS — K929 Disease of digestive system, unspecified: Secondary | ICD-10-CM

## 2020-08-19 DIAGNOSIS — F419 Anxiety disorder, unspecified: Secondary | ICD-10-CM

## 2020-08-19 DIAGNOSIS — R112 Nausea with vomiting, unspecified: Secondary | ICD-10-CM

## 2020-08-19 DIAGNOSIS — K219 Gastro-esophageal reflux disease without esophagitis: Secondary | ICD-10-CM

## 2020-08-19 DIAGNOSIS — C50911 Malignant neoplasm of unspecified site of right female breast: Secondary | ICD-10-CM

## 2020-08-19 DIAGNOSIS — Z789 Other specified health status: Secondary | ICD-10-CM

## 2020-08-25 ENCOUNTER — Encounter: Admit: 2020-08-25 | Discharge: 2020-08-25 | Payer: BC Managed Care – PPO

## 2020-08-25 ENCOUNTER — Ambulatory Visit: Admit: 2020-08-25 | Discharge: 2020-08-26 | Payer: BC Managed Care – PPO

## 2020-09-01 ENCOUNTER — Encounter: Admit: 2020-09-01 | Discharge: 2020-09-01 | Payer: BC Managed Care – PPO

## 2020-09-02 ENCOUNTER — Encounter: Admit: 2020-09-02 | Discharge: 2020-09-02 | Payer: BC Managed Care – PPO

## 2020-09-02 ENCOUNTER — Ambulatory Visit: Admit: 2020-09-02 | Discharge: 2020-09-03 | Payer: BC Managed Care – PPO

## 2020-09-02 DIAGNOSIS — C50911 Malignant neoplasm of unspecified site of right female breast: Secondary | ICD-10-CM

## 2020-09-02 DIAGNOSIS — F419 Anxiety disorder, unspecified: Secondary | ICD-10-CM

## 2020-09-02 DIAGNOSIS — G43709 Chronic migraine without aura, not intractable, without status migrainosus: Secondary | ICD-10-CM

## 2020-09-02 DIAGNOSIS — K929 Disease of digestive system, unspecified: Secondary | ICD-10-CM

## 2020-09-02 DIAGNOSIS — K219 Gastro-esophageal reflux disease without esophagitis: Secondary | ICD-10-CM

## 2020-09-02 DIAGNOSIS — R112 Nausea with vomiting, unspecified: Secondary | ICD-10-CM

## 2020-09-02 DIAGNOSIS — R1319 Other dysphagia: Secondary | ICD-10-CM

## 2020-09-02 DIAGNOSIS — Z9221 Personal history of antineoplastic chemotherapy: Secondary | ICD-10-CM

## 2020-09-02 DIAGNOSIS — Z789 Other specified health status: Secondary | ICD-10-CM

## 2020-09-02 DIAGNOSIS — G43909 Migraine, unspecified, not intractable, without status migrainosus: Secondary | ICD-10-CM

## 2020-09-02 MED ORDER — ONABOTULINUMTOXINA 100 UNIT IJ SOLR
155 [IU] | Freq: Once | INTRAMUSCULAR | 0 refills | Status: CP
Start: 2020-09-02 — End: ?
  Administered 2020-09-02: 18:00:00 155 [IU] via INTRAMUSCULAR

## 2020-09-02 NOTE — Progress Notes
Subjective: Debra Fleming is a 35 y.o. female here today for Botox for chronic migraines.     Pain scale:   Before botox injection: 2/10.    After botox injection: 2/10.     Do you understand the risks of botox as discussed in the consent and wish to proceed?   Patient states Yes    Are you currently pregnant or trying to become pregnant, as botox is not indicated while pregnant? Patient states No    Vitals:    09/02/20 1315   PainSc: Three   Height: (P) 167.6 cm (5' 5.98)       Botox Injection Procedure  Previous Injection Date: 06/18/2020    Information given verbally to the patient  today  included, but was not limited to:    Most common side effects: neck pain, headache, eyelid ptosis, migraine, muscular weakness, musculoskeletal stiffness, bronchitis, injection-site pain, musculoskeletal pain, myalgia, facial paresis, hypertension, muscle spasms; infection at injections sites, bruising, bleeding    Most serious side effects/risks: anaphylaxis, dysphagia,  arrhythmia, myocardial infarction, and in some cases, spontaneous death    Consent form was signed.    Confirmed: patient, procedure, side, site, safety procedures followed.     Performed by: Britt Bottom, MD     Preparation: no contraindications noted to Botox, possible medications prior to procedure:  topical numbing cream prior to procedure, Preparation of site with alcohol    Procedure performed:   Indication: Chronic Migraine Headaches  Medication: Onabotulinum toxin A  2.53ml normal saline as diluent /100 units (Botulinum Toxin 5 units per 0.19mL, 200 units prepared, 155 units used, 45 units of waste)    Location: PREEMPT protocol Jerrell Belfast SK, and coauthors. Cephalalgia, 2010;30:793)  Muscles/Sites Injected  A - Bilateral Corrugator - 10 units divided in 2 sites  B - Midline Procerus - 5 units in 1 site  C - Bilateral Frontalis - 20 units divided in 4 sites  D - Bilateral Temporalis - 40 units divided in 8 sites  E - Bilateral Occipitalis - 30 units divided in 6 sites  F - Bilateral Cervical Paraspinals - 20 units divided in 4 sites  G - Bilateral Trapezius - 30 units divided in 6 sites    Total Dose - 155 Units divided in 31 sites    Lot/Expiration: [id number] / 2023 09    Procedure tolerated: well.    Complications: none.  Diagnosis - Chronic Migraine Headaches   Course:  Progressing as expected.    Counseled: Patient/Family, Regarding diagnosis, Regarding treatment, Regarding medications. If any serious side effects occur, the patient has been instructed to go to the nearest emergency room and call our office.    Follow up: as scheduled prior to next Botox administration.    Procedure Time Out Check List:  Prior to the start of the procedure, I personally confirmed the following:    Site Marking Verified: Yes, as appropriate  Patient Identity (name & date of birth): Yes  Procedure: Yes  Site: Yes  Body Part: Head and shoulder region    The risks of the procedure, including infection, bleeding, pain and skin changes, were discussed with the patient.

## 2020-09-02 NOTE — Patient Instructions
1) Do NOT massage or apply pressure on the treated area for 4hrs after treatment since Botox may migrate to areas of undesirable effectiveness.   2) Do NOT lie down for 4 hours after treatment. This is to avoid the risk of pressure on the treated areas. Also Do NOT lean forward.  3) Avoid rigorous exercise/activities, extensive heat (eg. sauna, hot tub, tanning) and sun exposure, and alcoholic beverages for the first 24 hours after treatment. This may cause temporary redness, swelling, and/or itching at the injection sites. Feel free to shower and go about most other daily activities.  4) You may experience a mild headache after Botox. Should this occur, we recommend you avoid aspirin or aspirin containing products. You may opt instead to use acetaminophen, and/or cool compresses. Cold compresses may be used 10 minutes on 10 minutes off to reduce swelling 2-3 times per day during the first 1-2 days if needed.    5) Note that any bumps or marks will go away in a few hours. If you do develop a bruise it will resolve like any other bruises you may have had in about a week. There is occasionally some mild pain, swelling, itching, or redness at the site of injection similar to most other injections. Redness may last for 1-2 days, rarely longer. You may apply cool compresses or take acetaminophen to reduce swelling or discomfort.  6) Please call the office with any questions or concerns.        It is patient's responsibility to notify the clinic of any insurance changes, at least 2 weeks prior to any Botox injection appointment, to allow for prior authorization update.     Botox Savings Program:  [employer] will reimburse patients up to $700 per treatment  [email or web address] or [phone] Option 4 to register and submit claims.  You will need your EOB with patient responsibility listed.  If your EOB does not include CPT and J-code you will want to write those on the EOB to prevent delayed processing.    CPT code:  98119  J-code:  [id number]    -- If you are having acute (new/sudden onset) or severe/worsening neurologic symptoms, please call 911 or seek care in ED.    -- If you have internet access/smart phone please contact me through MyChart. This is our preferred method of contact and the most efficient way to contact your healthcare team. If you do not have internet access/smart phone you can call at [phone].   -- Preferred method of communication is through [employer], if the issue cannot wait until your next scheduled follow up.   -- MyChart may be used for non-emergent communication. Emails are not reviewed after hours or over the weekend/holidays/after 4PM. Staff will reply to your email within 24-48 business hours.     -- Please do not leave multiple voicemails for [place], leaving multiple voicemails delay [place] in getting back to you quicker.    -- If you do not hear from [place] within one week of a lab or imaging study being completed, please call/send my chart email to the office to be sure that we have received the results. This is especially challenging when tests are done outside of the [facility] system, as many times results do not make it back to our office for a variety of reasons. In our office no news is good news does not apply. You should hear from [place] with results for each test.    -- For referrals placed during the visit,  if you have not heard from scheduling within one week, please call the call center at [phone] to get scheduling assistance.  -- For radiology referrals placed during the visit, if you have not heard from scheduling within one week, please call the call center at [phone] to get scheduling assistance.    -- For medication refills, please first contact your pharmacy, who will fax a refill authorization request form to our office.  Weekdays only. Allow up to 2 business days for refills. Please plan ahead.  -- Allow one business week for our office to complete any requested paperwork (FMLA, etc.)     -- Our front desk staff, Samul Dada, or Marcelino Duster may be reached at [phone] for scheduling needs.   Huntley Dec, RN, may be contacted at [phone] for urgent needs. Staff will return your call within 24 business hours.     For Appointments:   -- Please try to arrive early for your appointment time to help facilitate your visit.   -- If you are late to your appointment, we reserve the right to ask you to reschedule or wait until next available time to be seen in fairness to other patients scheduled that day.   -- There are times when we are running behind in clinic. Our goal is to always be on time, however, there are time when unexpected events occur with patients, which may cause a delay. We appreciate your understanding when this occurs.  -- Please stop at the front desk after each visit to make sure we schedule your follow-up visit and print any referrals or labs for you.

## 2020-09-03 DIAGNOSIS — G43719 Chronic migraine without aura, intractable, without status migrainosus: Secondary | ICD-10-CM

## 2020-09-04 ENCOUNTER — Ambulatory Visit: Admit: 2020-09-04 | Discharge: 2020-09-04 | Payer: BC Managed Care – PPO

## 2020-09-04 ENCOUNTER — Encounter: Admit: 2020-09-04 | Discharge: 2020-09-04 | Payer: BC Managed Care – PPO

## 2020-09-04 DIAGNOSIS — K929 Disease of digestive system, unspecified: Secondary | ICD-10-CM

## 2020-09-04 DIAGNOSIS — K219 Gastro-esophageal reflux disease without esophagitis: Secondary | ICD-10-CM

## 2020-09-04 DIAGNOSIS — R1319 Other dysphagia: Secondary | ICD-10-CM

## 2020-09-04 DIAGNOSIS — Z789 Other specified health status: Secondary | ICD-10-CM

## 2020-09-04 DIAGNOSIS — Z9221 Personal history of antineoplastic chemotherapy: Secondary | ICD-10-CM

## 2020-09-04 DIAGNOSIS — R112 Nausea with vomiting, unspecified: Secondary | ICD-10-CM

## 2020-09-04 DIAGNOSIS — Z421 Encounter for breast reconstruction following mastectomy: Secondary | ICD-10-CM

## 2020-09-04 DIAGNOSIS — F419 Anxiety disorder, unspecified: Secondary | ICD-10-CM

## 2020-09-04 DIAGNOSIS — C50911 Malignant neoplasm of unspecified site of right female breast: Secondary | ICD-10-CM

## 2020-09-04 DIAGNOSIS — G43909 Migraine, unspecified, not intractable, without status migrainosus: Secondary | ICD-10-CM

## 2020-09-04 NOTE — Progress Notes
Subjective:       History of Present Illness  Debra Fleming is a 35 y.o. female.  08/17/2020 BILATERAL PERI-IMPLANT CAPSULECTOMY BREAST - COMPLETE, INCLUDING REMOVAL ALL INTRACAPSULAR CONTENTS (Bilateral)  ABDOMINAL EXCISION EXCESSIVE SKIN/ SUBCUTANEOUS TISSUE - ABDOMEN WITH UMBILICAL TRANSPOSITION AND FASCIAL PLICATION   GRAFTING AUTOLOGOUS FAT BY LIPOSUCTION TO CHEST - 90 CC RIGHT CHEST AND 87 CC LEFT CHEST WITH FLANK AND UPPER THIGH DONOR SITES  Using tylenol for pain  Bowels fine  Soreness in the abdomen     Review of Systems   Constitutional: Negative.    HENT: Negative.    Eyes: Negative.    Respiratory: Negative.    Cardiovascular: Negative.    Gastrointestinal: Negative.    Endocrine: Negative.    Genitourinary: Negative.    Musculoskeletal: Negative.    Skin: Negative.    Allergic/Immunologic: Negative.    Neurological: Negative.    Hematological: Negative.    Psychiatric/Behavioral: Negative.          Objective:         ? ALPRAZolam (XANAX) 0.5 mg tablet TAKE 1 TABLET BY MOUTH TWICE DAILY as needed for anxiety   ? Bacillus coagulans/inulin (PROBIOTIC WITH PREBIOTIC PO) Take  by mouth.   ? diazePAM (VALIUM) 5 mg tablet Take one tablet by mouth every 8 hours as needed for Anxiety. Indications: muscle spasm   ? doxycycline monohydrate (MONODOX) 100 mg capsule Take 100 mg by mouth daily.   ? gabapentin (NEURONTIN) 100 mg capsule take 2 capsules BY MOUTH EVERY 8 HOURS (Patient taking differently: Take 200 mg by mouth twice daily. 200 mg in am and afternoon)   ? gabapentin (NEURONTIN) 300 mg capsule take 3 capsules BY MOUTH at bedtime DAILY   ? galcanezumab-gnlm (EMGALITY PEN) 120 mg/mL subcutaneous PEN Inject 1 mL under the skin every 30 days.   ? GEMTESA 75 mg tablet Take 75 mg by mouth daily.   ? goserelin (ZOLADEX) 3.6 mg/implant syringe Inject 3.6 mg under the skin every 28 days.   ? levocetirizine (XYZAL) 5 mg tablet Take 5 mg by mouth at bedtime daily.   ? lisinopriL (ZESTRIL) 20 mg tablet Take 20 mg by mouth daily.   ? ONAbotulinum toxin A (BOTOX) 100 Units/1 mL injection Inject into the muscle as directed. For Migraines.   ? ondansetron (ZOFRAN ODT) 4 mg rapid dissolve tablet Dissolve one tablet by mouth every 6 hours as needed. Place on tongue to dissolve.  Indications: prevent nausea and vomiting after surgery   ? tamoxifen (NOLVADEX) 20 mg tablet TAKE ONE TABLET BY MOUTH ONCE DAILY   ? topiramate (TOPAMAX) 100 mg tablet Take 100 mg by mouth every 12 hours.   ? VIIBRYD 20 mg tablet TAKE 1 TABLET BY MOUTH DAILY. must take with A meal/food   ? zolpidem (AMBIEN) 10 mg tablet TAKE 1 TABLET BY MOUTH at bedtime     Vitals:    09/04/20 1135   PainSc: Three     There is no height or weight on file to calculate BMI.     Physical Exam  Vitals and nursing note reviewed.   Pulmonary:      Effort: Pulmonary effort is normal.   Chest:   Breasts:      Right: Absent.      Left: Absent.        Comments: Healing well  No infection  Some right medial depression    Abdominal:      Palpations: Abdomen is soft.  Comments: Contour good  Tender in midline as expected    Neurological:      Mental Status: She is oriented to person, place, and time.   Psychiatric:         Behavior: Behavior normal.              Assessment and Plan:  Continue with compression for another 2-4 weeks  May swim as desires  Increase to full activities  Check in 3-4 months

## 2020-09-04 NOTE — Patient Instructions
Continue with compression for another 2-4 weeks  May swim as desires  Increase to full activities  Check in 3-4 months

## 2020-09-05 ENCOUNTER — Encounter: Admit: 2020-09-05 | Discharge: 2020-09-05 | Payer: BC Managed Care – PPO

## 2020-09-05 MED ORDER — EMGALITY PEN 120 MG/ML SC PNIJ
1 refills
Start: 2020-09-05 — End: ?

## 2020-09-09 ENCOUNTER — Encounter: Admit: 2020-09-09 | Discharge: 2020-09-09 | Payer: BC Managed Care – PPO

## 2020-09-18 ENCOUNTER — Encounter: Admit: 2020-09-18 | Discharge: 2020-09-18 | Payer: BC Managed Care – PPO

## 2020-09-18 DIAGNOSIS — Z789 Other specified health status: Secondary | ICD-10-CM

## 2020-09-18 DIAGNOSIS — F419 Anxiety disorder, unspecified: Secondary | ICD-10-CM

## 2020-09-18 DIAGNOSIS — K929 Disease of digestive system, unspecified: Secondary | ICD-10-CM

## 2020-09-18 DIAGNOSIS — C50411 Malignant neoplasm of upper-outer quadrant of right female breast: Secondary | ICD-10-CM

## 2020-09-18 DIAGNOSIS — C50911 Malignant neoplasm of unspecified site of right female breast: Secondary | ICD-10-CM

## 2020-09-18 DIAGNOSIS — G43909 Migraine, unspecified, not intractable, without status migrainosus: Secondary | ICD-10-CM

## 2020-09-18 DIAGNOSIS — R1319 Other dysphagia: Secondary | ICD-10-CM

## 2020-09-18 DIAGNOSIS — R112 Nausea with vomiting, unspecified: Secondary | ICD-10-CM

## 2020-09-18 DIAGNOSIS — Z9221 Personal history of antineoplastic chemotherapy: Secondary | ICD-10-CM

## 2020-09-18 DIAGNOSIS — K219 Gastro-esophageal reflux disease without esophagitis: Secondary | ICD-10-CM

## 2020-09-18 MED ORDER — LIDOCAINE (PF) 10 MG/ML (1 %) IJ SOLN
2 mL | Freq: Once | SUBCUTANEOUS | 0 refills | Status: CP
Start: 2020-09-18 — End: ?
  Administered 2020-09-18: 16:00:00 2 mL via SUBCUTANEOUS

## 2020-09-18 MED ORDER — GOSERELIN 3.6 MG SC IMPL
3.6 mg | Freq: Once | SUBCUTANEOUS | 0 refills | Status: CP
Start: 2020-09-18 — End: ?
  Administered 2020-09-18: 17:00:00 3.6 mg via SUBCUTANEOUS

## 2020-09-18 NOTE — Progress Notes
Patient received zoladex and tolerated without difficulty.  No pertinent changes since last assessment.  Pt denies need for AVS, left ambulatory at 1140.

## 2020-09-21 ENCOUNTER — Encounter: Admit: 2020-09-21 | Discharge: 2020-09-21 | Payer: BC Managed Care – PPO

## 2020-09-24 ENCOUNTER — Encounter: Admit: 2020-09-24 | Discharge: 2020-09-24 | Payer: BC Managed Care – PPO

## 2020-09-29 ENCOUNTER — Encounter: Admit: 2020-09-29 | Discharge: 2020-09-29 | Payer: BC Managed Care – PPO

## 2020-10-07 ENCOUNTER — Encounter: Admit: 2020-10-07 | Discharge: 2020-10-07 | Payer: BC Managed Care – PPO

## 2020-10-07 MED ORDER — GABAPENTIN 100 MG PO CAP
200 mg | ORAL_CAPSULE | Freq: Every day | ORAL | 1 refills | Status: AC
Start: 2020-10-07 — End: ?

## 2020-10-08 ENCOUNTER — Encounter: Admit: 2020-10-08 | Discharge: 2020-10-08 | Payer: BC Managed Care – PPO

## 2020-10-08 MED ORDER — LIDOCAINE (PF) 10 MG/ML (1 %) IJ SOLN
2 mL | Freq: Once | SUBCUTANEOUS | 0 refills
Start: 2020-10-08 — End: ?

## 2020-10-08 MED ORDER — GOSERELIN 3.6 MG SC IMPL
3.6 mg | Freq: Once | SUBCUTANEOUS | 0 refills
Start: 2020-10-08 — End: ?

## 2020-10-12 ENCOUNTER — Encounter: Admit: 2020-10-12 | Discharge: 2020-10-12 | Payer: BC Managed Care – PPO

## 2020-10-12 DIAGNOSIS — C50411 Malignant neoplasm of upper-outer quadrant of right female breast: Secondary | ICD-10-CM

## 2020-10-12 MED ORDER — GOSERELIN 3.6 MG SC IMPL
3.6 mg | Freq: Once | SUBCUTANEOUS | 0 refills | Status: CP
Start: 2020-10-12 — End: ?
  Administered 2020-10-12: 21:00:00 3.6 mg via SUBCUTANEOUS

## 2020-10-12 MED ORDER — LIDOCAINE (PF) 10 MG/ML (1 %) IJ SOLN
2 mL | Freq: Once | SUBCUTANEOUS | 0 refills | Status: CP
Start: 2020-10-12 — End: ?
  Administered 2020-10-12: 21:00:00 2 mL via SUBCUTANEOUS

## 2020-10-12 NOTE — Progress Notes
Patient received Zoladex and tolerated without difficulty.  No pertinent changes since last assessment.

## 2020-11-09 ENCOUNTER — Encounter: Admit: 2020-11-09 | Discharge: 2020-11-09 | Payer: BC Managed Care – PPO

## 2020-11-09 MED ORDER — LIDOCAINE (PF) 10 MG/ML (1 %) IJ SOLN
2 mL | Freq: Once | SUBCUTANEOUS | 0 refills
Start: 2020-11-09 — End: ?

## 2020-11-09 MED ORDER — GOSERELIN 3.6 MG SC IMPL
3.6 mg | Freq: Once | SUBCUTANEOUS | 0 refills
Start: 2020-11-09 — End: ?

## 2020-11-11 ENCOUNTER — Encounter: Admit: 2020-11-11 | Discharge: 2020-11-11 | Payer: BC Managed Care – PPO

## 2020-11-11 DIAGNOSIS — Z789 Other specified health status: Secondary | ICD-10-CM

## 2020-11-11 DIAGNOSIS — R1319 Other dysphagia: Secondary | ICD-10-CM

## 2020-11-11 DIAGNOSIS — K219 Gastro-esophageal reflux disease without esophagitis: Secondary | ICD-10-CM

## 2020-11-11 DIAGNOSIS — C50411 Malignant neoplasm of upper-outer quadrant of right female breast: Secondary | ICD-10-CM

## 2020-11-11 DIAGNOSIS — G43909 Migraine, unspecified, not intractable, without status migrainosus: Secondary | ICD-10-CM

## 2020-11-11 DIAGNOSIS — C50911 Malignant neoplasm of unspecified site of right female breast: Secondary | ICD-10-CM

## 2020-11-11 DIAGNOSIS — Z9221 Personal history of antineoplastic chemotherapy: Secondary | ICD-10-CM

## 2020-11-11 DIAGNOSIS — K929 Disease of digestive system, unspecified: Secondary | ICD-10-CM

## 2020-11-11 DIAGNOSIS — R112 Nausea with vomiting, unspecified: Secondary | ICD-10-CM

## 2020-11-11 DIAGNOSIS — Z79810 Long term (current) use of selective estrogen receptor modulators (SERMs): Secondary | ICD-10-CM

## 2020-11-11 DIAGNOSIS — F419 Anxiety disorder, unspecified: Secondary | ICD-10-CM

## 2020-11-11 LAB — ESTRADIOL (E2): ESTRADIOL: <15 pg/mL

## 2020-11-11 NOTE — Patient Instructions
Team Members:  Dr. Priyanka Sharma, MD  Jaimie Heldstab, APRN  Lisa Her, RN  Tywanda Rice, RN    Contact information:   Office phone: [phone]  Office fax: [phone]  After hours phone: [phone]    Address:  [address], [address]  [place], [place] [address] Team Members:  Dr. Priyanka Sharma, MD  Jaimie Heldstab, APRN  Lisa Her, RN  Varetta Chavers, RN    Contact information:   Office phone: [phone]  Office fax: [phone]  After hours phone: [phone]    Address:  [address], [address]  [place], [place] [address]

## 2020-11-19 ENCOUNTER — Encounter: Admit: 2020-11-19 | Discharge: 2020-11-19 | Payer: BC Managed Care – PPO

## 2020-11-19 ENCOUNTER — Ambulatory Visit: Admit: 2020-11-19 | Discharge: 2020-11-19 | Payer: BC Managed Care – PPO

## 2020-11-19 DIAGNOSIS — Z9221 Personal history of antineoplastic chemotherapy: Secondary | ICD-10-CM

## 2020-11-19 DIAGNOSIS — Z789 Other specified health status: Secondary | ICD-10-CM

## 2020-11-19 DIAGNOSIS — R1319 Other dysphagia: Secondary | ICD-10-CM

## 2020-11-19 DIAGNOSIS — K929 Disease of digestive system, unspecified: Secondary | ICD-10-CM

## 2020-11-19 DIAGNOSIS — F419 Anxiety disorder, unspecified: Secondary | ICD-10-CM

## 2020-11-19 DIAGNOSIS — C50911 Malignant neoplasm of unspecified site of right female breast: Secondary | ICD-10-CM

## 2020-11-19 DIAGNOSIS — G43909 Migraine, unspecified, not intractable, without status migrainosus: Secondary | ICD-10-CM

## 2020-11-19 DIAGNOSIS — R112 Nausea with vomiting, unspecified: Secondary | ICD-10-CM

## 2020-11-19 DIAGNOSIS — G43709 Chronic migraine without aura, not intractable, without status migrainosus: Principal | ICD-10-CM

## 2020-11-19 DIAGNOSIS — K219 Gastro-esophageal reflux disease without esophagitis: Secondary | ICD-10-CM

## 2020-11-19 MED ORDER — ONABOTULINUMTOXINA 100 UNIT IJ SOLR
200 [IU] | Freq: Once | INTRAMUSCULAR | 0 refills | Status: CP
Start: 2020-11-19 — End: ?
  Administered 2020-11-19: 15:00:00 185 [IU] via INTRAMUSCULAR

## 2020-11-19 MED ORDER — EMGALITY PEN 120 MG/ML SC PNIJ
120 mg | SUBCUTANEOUS | 3 refills | Status: AC
Start: 2020-11-19 — End: ?

## 2020-11-19 NOTE — Progress Notes
Subjective: Debra Fleming is a 35 y.o. female here today for Botox for chronic migraines. Prior to injections, they experienced 20+ headache days per month with an average duration of 4+ hours. They are located holocephalic. They are associated with nausea, photophobia, phonophobia. Pain scale 5+/10. Modalities for relief tried rizatriptan, topiramate, venlafaxine, botox, sumatriptan, amitriptyline, metoprolol.    Currently they have 15+ headache days a month, lasting on average 4+ hours. Having daily head pressure with approx 3 migraines in past month, worse since last treatment.    Do you understand the risks of botox as discussed in the consent and wish to proceed?   Patient states yes    Are you currently pregnant or trying to become pregnant, as botox is not indicated while pregnant? No    Vitals:    11/19/20 0914   BP: 104/74   BP Source: Arm, Left Upper   Pulse: 88   SpO2: 97%   PainSc: Two   Height: 170 cm (5' 6.93)       Botox Injection Procedure  Previous Injection Date: 09/02/20    Information given verbally to the patient  today  included, but was not limited to:    Most common side effects: neck pain, headache, eyelid ptosis, migraine, muscular weakness, musculoskeletal stiffness, bronchitis, injection-site pain, musculoskeletal pain, myalgia, facial paresis, hypertension, muscle spasms; infection at injections sites, bruising, bleeding    Most serious side effects/risks: anaphylaxis, dysphagia,  arrhythmia, myocardial infarction, and in some cases, spontaneous death    Consent form was signed.    Confirmed: patient, procedure, side, site, safety procedures followed.     Performed by:  [facility]     Preparation: no contraindications noted to Botox, possible medications prior to procedure:  topical numbing cream prior to procedure, Preparation of site with alcohol    Procedure performed:   Indication: Chronic Migraine Headaches  Medication: Onabotulinum toxin A  2.17ml normal saline as diluent /100 units (Botulinum Toxin 5 units per 0.58mL, 200 units prepared, 185 units used, 15 units of waste)    Location: PREEMPT protocol Jerrell Belfast SK, and coauthors. Cephalalgia, 2010;30:793)  Muscles/Sites Injected  A - Bilateral Corrugator - 10 units divided in 2 sites  B - Midline Procerus - 5 units in 1 site  C - Bilateral Frontalis - 20 units divided in 4 sites  D - Bilateral Temporalis - 40 units divided in 8 sites  E - Bilateral Occipitalis - 30 units divided in 6 sites, additional 10 units divided in 2 sites  F - Bilateral Cervical Paraspinals - 20 units divided in 4 sites  G - Bilateral Trapezius - 30 units divided in 6 sites, additional 20 units divided in 4 sites    Total Dose - 185 Units divided in 37 sites    Lot/Expiration: [id number] 07/2022 (both vials)    Procedure tolerated: well.    Complications: none.  Diagnosis - Chronic Migraine Headaches   Course:  Progressing as expected.    Counseled: Patient/Family, Regarding diagnosis, Regarding treatment, Regarding medications. If any serious side effects occur, the patient has been instructed to go to the nearest emergency room and call our office.    Follow up: as scheduled prior to next Botox administration.

## 2020-12-04 ENCOUNTER — Encounter: Admit: 2020-12-04 | Discharge: 2020-12-04 | Payer: BC Managed Care – PPO

## 2020-12-04 MED ORDER — GABAPENTIN 100 MG PO CAP
ORAL_CAPSULE | Freq: Three times a day (TID) | 3 refills | Status: AC
Start: 2020-12-04 — End: ?

## 2020-12-14 ENCOUNTER — Encounter: Admit: 2020-12-14 | Discharge: 2020-12-14 | Payer: BC Managed Care – PPO

## 2020-12-14 ENCOUNTER — Ambulatory Visit: Admit: 2020-12-14 | Discharge: 2020-12-14 | Payer: BC Managed Care – PPO

## 2020-12-14 DIAGNOSIS — F419 Anxiety disorder, unspecified: Secondary | ICD-10-CM

## 2020-12-14 DIAGNOSIS — R112 Nausea with vomiting, unspecified: Secondary | ICD-10-CM

## 2020-12-14 DIAGNOSIS — C50919 Malignant neoplasm of unspecified site of unspecified female breast: Secondary | ICD-10-CM

## 2020-12-14 DIAGNOSIS — Z9221 Personal history of antineoplastic chemotherapy: Secondary | ICD-10-CM

## 2020-12-14 DIAGNOSIS — K219 Gastro-esophageal reflux disease without esophagitis: Secondary | ICD-10-CM

## 2020-12-14 DIAGNOSIS — C50911 Malignant neoplasm of unspecified site of right female breast: Secondary | ICD-10-CM

## 2020-12-14 DIAGNOSIS — Z789 Other specified health status: Secondary | ICD-10-CM

## 2020-12-14 DIAGNOSIS — Z9013 Acquired absence of bilateral breasts and nipples: Secondary | ICD-10-CM

## 2020-12-14 DIAGNOSIS — K929 Disease of digestive system, unspecified: Secondary | ICD-10-CM

## 2020-12-14 DIAGNOSIS — G43909 Migraine, unspecified, not intractable, without status migrainosus: Secondary | ICD-10-CM

## 2020-12-14 DIAGNOSIS — R1319 Other dysphagia: Secondary | ICD-10-CM

## 2020-12-14 NOTE — Progress Notes
Date of Service: 12/14/2020    Subjective:             Debra Fleming is a 35 y.o. female.    History of Present Illness  08/17/2020 BILATERAL PERI-IMPLANT CAPSULECTOMY BREAST - COMPLETE, INCLUDING REMOVAL ALL INTRACAPSULAR CONTENTS (Bilateral)  ABDOMINAL EXCISION EXCESSIVE SKIN/ SUBCUTANEOUS TISSUE - ABDOMEN WITH UMBILICAL TRANSPOSITION AND FASCIAL PLICATION   GRAFTING AUTOLOGOUS FAT BY LIPOSUCTION TO?CHEST -?90 CC?RIGHT CHEST AND 87?CC LEFT CHEST WITH FLANK AND UPPER THIGH DONOR SITES  Generally pleased but wants to be more even and notes some upper abdomen fullness     Review of Systems   Constitutional: Negative.    HENT: Negative.    Eyes: Negative.    Respiratory: Negative.    Cardiovascular: Negative.    Gastrointestinal: Negative.    Endocrine: Negative.    Genitourinary: Negative.    Musculoskeletal: Negative.    Skin: Negative.    Allergic/Immunologic: Negative.    Neurological: Negative.    Hematological: Negative.    Psychiatric/Behavioral: Negative.          Objective:         ? ALPRAZolam (XANAX) 0.5 mg tablet TAKE 1 TABLET BY MOUTH TWICE DAILY as needed for anxiety   ? diazePAM (VALIUM) 5 mg tablet Take one tablet by mouth every 8 hours as needed for Anxiety. Indications: muscle spasm   ? doxycycline monohydrate (MONODOX) 100 mg capsule Take 100 mg by mouth daily.   ? gabapentin (NEURONTIN) 100 mg capsule take 2 capsules BY MOUTH EVERY 8 HOURS   ? gabapentin (NEURONTIN) 300 mg capsule take 3 capsules BY MOUTH at bedtime DAILY   ? galcanezumab-gnlm (EMGALITY PEN) 120 mg/mL subcutaneous PEN Inject 1 mL under the skin every 30 days.   ? GEMTESA 75 mg tablet Take 75 mg by mouth daily.   ? levocetirizine (XYZAL) 5 mg tablet Take 5 mg by mouth at bedtime daily.   ? lisinopriL (ZESTRIL) 20 mg tablet Take 20 mg by mouth daily.   ? ONAbotulinum toxin A (BOTOX) 100 Units/1 mL injection Inject into the muscle as directed. For Migraines.   ? ondansetron (ZOFRAN ODT) 4 mg rapid dissolve tablet Dissolve one tablet by mouth every 6 hours as needed. Place on tongue to dissolve.  Indications: prevent nausea and vomiting after surgery   ? QUEtiapine (SEROQUEL) 50 mg tablet    ? tamoxifen (NOLVADEX) 20 mg tablet TAKE ONE TABLET BY MOUTH ONCE DAILY   ? topiramate (TOPAMAX) 100 mg tablet Take 100 mg by mouth every 12 hours.   ? VIIBRYD 20 mg tablet TAKE 1 TABLET BY MOUTH DAILY. must take with A meal/food     Vitals:    12/14/20 1408   BP: 132/85   BP Source: Arm, Left Upper   Pulse: 90   Temp: 36.5 ?C (97.7 ?F)   SpO2: 100%   TempSrc: Temporal   PainSc: Zero   Weight: 93.3 kg (205 lb 9.6 oz)   Height: 170 cm (5' 6.93)     Body mass index is 32.27 kg/m?Marland Kitchen     Physical Exam  Well healed  Some areas with some adipose on both sides of the chest along some left upper and central abdomen  Abdominal scar settling but still red       Assessment and Plan:  biocorneum  Schedule for tumescent liposuction with power machine in the office  1.5 hr local n/c  Discussed recovery and expected result

## 2021-01-04 ENCOUNTER — Encounter: Admit: 2021-01-04 | Discharge: 2021-01-04 | Payer: BC Managed Care – PPO

## 2021-01-04 MED ORDER — TAMOXIFEN 20 MG PO TAB
ORAL_TABLET | Freq: Every day | 3 refills | Status: AC
Start: 2021-01-04 — End: ?

## 2021-02-08 ENCOUNTER — Encounter: Admit: 2021-02-08 | Discharge: 2021-02-08 | Payer: BC Managed Care – PPO

## 2021-02-08 ENCOUNTER — Ambulatory Visit: Admit: 2021-02-08 | Discharge: 2021-02-08 | Payer: BC Managed Care – PPO

## 2021-02-08 DIAGNOSIS — C50911 Malignant neoplasm of unspecified site of right female breast: Secondary | ICD-10-CM

## 2021-02-08 DIAGNOSIS — G43909 Migraine, unspecified, not intractable, without status migrainosus: Secondary | ICD-10-CM

## 2021-02-08 DIAGNOSIS — K929 Disease of digestive system, unspecified: Secondary | ICD-10-CM

## 2021-02-08 DIAGNOSIS — Z9221 Personal history of antineoplastic chemotherapy: Secondary | ICD-10-CM

## 2021-02-08 DIAGNOSIS — Z789 Other specified health status: Secondary | ICD-10-CM

## 2021-02-08 DIAGNOSIS — F419 Anxiety disorder, unspecified: Secondary | ICD-10-CM

## 2021-02-08 DIAGNOSIS — R1319 Other dysphagia: Secondary | ICD-10-CM

## 2021-02-08 DIAGNOSIS — K219 Gastro-esophageal reflux disease without esophagitis: Secondary | ICD-10-CM

## 2021-02-08 DIAGNOSIS — R112 Nausea with vomiting, unspecified: Secondary | ICD-10-CM

## 2021-02-08 DIAGNOSIS — G43709 Chronic migraine without aura, not intractable, without status migrainosus: Secondary | ICD-10-CM

## 2021-02-08 MED ORDER — BACLOFEN 5 MG PO TAB
5 mg | ORAL_TABLET | Freq: Every evening | ORAL | 2 refills | Status: AC
Start: 2021-02-08 — End: ?

## 2021-02-08 MED ORDER — ONABOTULINUMTOXINA 100 UNIT IJ SOLR
200 [IU] | Freq: Once | INTRAMUSCULAR | 0 refills | Status: CP
Start: 2021-02-08 — End: ?
  Administered 2021-02-08: 18:00:00 195 [IU] via INTRAMUSCULAR

## 2021-02-08 MED ORDER — EMGALITY PEN 120 MG/ML SC PNIJ
120 mg | SUBCUTANEOUS | 11 refills | Status: AC
Start: 2021-02-08 — End: ?

## 2021-02-08 NOTE — Progress Notes
Subjective: Debra Fleming is a 35 y.o. female here today for Botox for chronic migraines. Prior to injections, they experienced 20+?headache days per month with an average duration of 4+?hours. They are located holocephalic. They are associated with nausea, photophobia, phonophobia. Pain scale?5+/10. Modalities for relief tried?rizatriptan, topiramate, venlafaxine, botox, sumatriptan, amitriptyline, metoprolol.    Currently they have 12 headache days a month, lasting on average 3+ hours.    Do you understand the risks of botox as discussed in the consent and wish to proceed?   Patient states yes    Are you currently pregnant or trying to become pregnant, as botox is not indicated while pregnant? No    Vitals:    02/08/21 1056   BP: 122/83   BP Source: Arm, Left Upper   Pulse: 83   SpO2: 98%   PainSc: Three   Height: 170 cm (5' 6.93)       Botox Injection Procedure  Previous Injection Date: 11/19/20    Information given verbally to the patient  today  included, but was not limited to:    Most common side effects: neck pain, headache, eyelid ptosis, migraine, muscular weakness, musculoskeletal stiffness, bronchitis, injection-site pain, musculoskeletal pain, myalgia, facial paresis, hypertension, muscle spasms; infection at injections sites, bruising, bleeding    Most serious side effects/risks: anaphylaxis, dysphagia,  arrhythmia, myocardial infarction, and in some cases, spontaneous death    Consent form was signed.    Confirmed: patient, procedure, side, site, safety procedures followed.     Performed by:  [facility]     Preparation: no contraindications noted to Botox, possible medications prior to procedure:  topical numbing cream prior to procedure, Preparation of site with alcohol    Procedure performed:   Indication: Chronic Migraine Headaches  Medication: Onabotulinum toxin A  2.62ml normal saline as diluent /100 units (Botulinum Toxin 5 units per 0.39mL, 200 units prepared, 185 units used, 15 units of waste)  ?  Location: PREEMPT protocol Jerrell Belfast SK, and coauthors. Cephalalgia, 2010;30:793)  Muscles/Sites Injected  A - Bilateral Corrugator - 10 units divided in 2 sites  B - Midline Procerus - 5 units in 1 site  C - Bilateral Frontalis - 20 units divided in 4 sites, additional 10 units divided in 2 sites  D - Bilateral Temporalis - 40 units divided in 8 sites  E - Bilateral Occipitalis - 30 units divided in 6 sites, additional 10 units divided in 2 sites  F - Bilateral Cervical Paraspinals - 20 units divided in 4 sites  G - Bilateral Trapezius - 30 units divided in 6 sites, additional 20 units divided in 4 sites  ?  Total Dose - 195 Units divided in 39 sites    Lot/Expiration: [id number] 01/2023, [id number] 01/2023     Procedure tolerated: well.    Complications: none.  Diagnosis - Chronic Migraine Headaches   Course:  Progressing as expected.    Counseled: Patient/Family, Regarding diagnosis, Regarding treatment, Regarding medications. If any serious side effects occur, the patient has been instructed to go to the nearest emergency room and call our office.    Follow up: as scheduled prior to next Botox administration.

## 2021-02-23 ENCOUNTER — Encounter: Admit: 2021-02-23 | Discharge: 2021-02-23 | Payer: BC Managed Care – PPO

## 2021-02-23 NOTE — Progress Notes
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 [place] is a premenopausal female diagnosed with right breast cancer at age 35.  Ms. Macrina felt a right breast lump in April 2016 and when it started to increase in size, she went to her PCP. Bilateral diagnostic mammogram 09/08/14 ([facility]) revealed a 2.3 cm irregular mass 9:00, 7 cm FTN in the right breast.  There were a few other focal symmetries in the right breast at 10:00, 12:00, and 6:00. In the left breast there was a 7 mm asymmetry in the lower inner quadrant, 7 cm FTN. Bilateral breast ultrasound 09/08/14 ([facility]) revealed in the right breast a 2.3 cm irregular hypoechoic mass at 9:00. There was also a 1.5 cm mass at 10:00, 7 cm FTN. Additional suspicious findings were noted at 6:00, 7 cm FTN measuring 7 mm and 12:00, 4 cm FTN measuring 5 mm. The right axilla appeared normal. In the left breast there was a 7 mm hypoechoic cluster of cysts with no internal vascularity. Right breast biopsy was recommended. A 6 month follow up left breast ultrasound was recommended (BI-RADS 5).      Imaging was repeated at [facility] 09/11/14.  Bilateral breast US 09/11/14 revealed an irregular right breast mass measuring up to 3.5cm at 9:00, 7cm FTN.  There was also an 8mm area at 10:00, 5cm FTN, a 4mm area at 12:00 4cm FTN, 7mm focus at 6:00 4cm FTN, 1cm area at 12:00 2cm FTN, and at 12:30 subareolar ,measuring 2 cm x 0.5 cm.  The left breast revealed a 9x4x61mm mass at 9:30, 2cm FTN.     She had three core needle biopsies 09/11/14.  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  Right breast biopsy at 12:00, 2cm FTN revealed usual ductal hyperplasia, no evidence of malignancy.  Right breast biopsy at 9:00, 7cm FTN revealed grade II IDC (ER 85%, PR 98%, Her2 1+, Hi-67: 6%).    She had a bilateral mastectomy on 10/22/14 which revealed multicentric right, grade II, IDC. Tumor #1 at 9:00 measured 2.3cm (ER 90%, PR 100%, Her2 2+, FISH: 1.5 with avg #Her2 signals 4.01 (negative), Ki-67: 6%).  Tumor #2 at 6:00 measured 6 mm (ER 96%, PR 100%, Her2 1+, Ki-67 7%).  Tumor #3 (found incidentally) in the Left outer quadrant measured 2mm (ER and PR 100%, HER2 IHC 2+, Ki-67 17%, HER2 FISH negative); 4 negative SLN.  There was a distance of 2.5cm between the first and second mass.  Distance between second and the third lesion are unknown.     She received 4 cycles of adjuvant AC (8/18-10/8/16) followed by Taxol 10/24-12/29/16 - last dose was dose dense.    PRESENT THERAPY: Tamoxifen (started 06/2015), zoladex 12/2015-09/2020.    CHANGES SINCE LAST INTERVENTION: Ms. Minnifield returns to clinic for follow-up. She continues on tamoxifen but has not noticed improvement in her symptoms (stress in marriage, mood, hot flashes, migraines etc) since stopping zoladex.            Review of Systems   Constitutional: Positive for fatigue (stable). Negative for unexpected weight change.        Hot flashes     HENT: Negative.    Eyes: Negative.  Negative for visual disturbance.   Respiratory:  Negative.  Negative for cough and shortness of breath.    Cardiovascular: Negative.  Negative for chest pain.   Gastrointestinal: Negative.  Negative for abdominal pain, constipation, diarrhea, nausea and vomiting.   Endocrine: Negative.    Genitourinary: Negative.  Negative for difficulty urinating.   Musculoskeletal: Negative for arthralgias and back pain.   Skin: Negative.  Negative for rash.   Allergic/Immunologic: Negative.    Neurological: Positive for numbness (feet, hands, stable). Negative for dizziness, weakness and headaches.   Hematological: Negative.  Negative for adenopathy.   Psychiatric/Behavioral: Positive for dysphoric mood (improved) and sleep disturbance (improved with ambien). The patient is nervous/anxious (stable). PMH: Negative  FAMILY HX: No family history of breast cancer.  Paternal grandmother (fallopian ca- genetic test negative). Maternal grandmother and grandfather with lung cancer. Maternal uncle throat cancer (age 41).  GYN: Menarche age 13. G2P2, ovaries/uterus intact.  On birth control x10 years, stopped at diagnosis  SOCIAL:  Hair dresser, married, lives in [place]    Objective:         ? ALPRAZolam (XANAX) 0.5 mg tablet TAKE 1 TABLET BY MOUTH TWICE DAILY as needed for anxiety   ? baclofen 5 mg tablet Take one tablet by mouth at bedtime daily.   ? diazePAM (VALIUM) 5 mg tablet Take one tablet by mouth every 8 hours as needed for Anxiety. Indications: muscle spasm   ? doxycycline monohydrate (MONODOX) 100 mg capsule Take 100 mg by mouth daily.   ? gabapentin (NEURONTIN) 100 mg capsule take 2 capsules BY MOUTH EVERY 8 HOURS   ? gabapentin (NEURONTIN) 300 mg capsule take 3 capsules BY MOUTH at bedtime DAILY   ? galcanezumab-gnlm (EMGALITY PEN) 120 mg/mL subcutaneous PEN Inject 1 mL under the skin every 30 days.   ? GEMTESA 75 mg tablet Take 75 mg by mouth daily.   ? levocetirizine (XYZAL) 5 mg tablet Take 5 mg by mouth at bedtime daily.   ? lisinopriL (ZESTRIL) 20 mg tablet Take 20 mg by mouth daily.   ? ONAbotulinum toxin A (BOTOX) 100 Units/1 mL injection Inject into the muscle as directed. For Migraines.   ? ondansetron (ZOFRAN ODT) 4 mg rapid dissolve tablet Dissolve one tablet by mouth every 6 hours as needed. Place on tongue to dissolve.  Indications: prevent nausea and vomiting after surgery   ? QUEtiapine (SEROQUEL) 50 mg tablet    ? tamoxifen (NOLVADEX) 20 mg tablet TAKE ONE TABLET BY MOUTH ONCE DAILY   ? topiramate (TOPAMAX) 100 mg tablet Take 100 mg by mouth every 12 hours.   ? VIIBRYD 20 mg tablet TAKE 1 TABLET BY MOUTH DAILY. must take with A meal/food     Vitals:    02/25/21 1520   BP: 124/88   Pulse: 90   Temp: 36.6 ?C (97.9 ?F)   Resp: 16   SpO2: 100%       Body mass index is 31.96 kg/m?Marland Kitchen Pain Score: Zero     Pain Addressed:  N/A    Patient Evaluated for a Clinical Trial: Patient not eligible for a treatment trial (including not needing treatment, needs palliative care, in remission).     [place] Cooperative Oncology Group performance status is 0, Fully active, able to carry on all pre-disease performance without restriction.Marland Kitchen     Physical Exam  Vitals reviewed.   Constitutional:       General: She is not in acute distress.     Appearance: She is well-developed.   HENT:  Head: Normocephalic and atraumatic.   Eyes:      General: No scleral icterus.        Right eye: No discharge.         Left eye: No discharge.      Conjunctiva/sclera: Conjunctivae normal.   Cardiovascular:      Rate and Rhythm: Normal rate and regular rhythm.      Heart sounds: Normal heart sounds.   Pulmonary:      Effort: Pulmonary effort is normal. No respiratory distress.      Breath sounds: Normal breath sounds. No wheezing.   Chest:       Abdominal:      General: There is no distension.   Musculoskeletal:         General: No tenderness. Normal range of motion.      Cervical back: Normal range of motion.      Right lower leg: No edema.      Left lower leg: No edema.   Lymphadenopathy:      Cervical: No cervical adenopathy.      Upper Body:      Right upper body: No supraclavicular or axillary adenopathy.      Left upper body: No supraclavicular or axillary adenopathy.   Skin:     General: Skin is warm and dry.      Coloration: Skin is not jaundiced or pale.      Findings: No erythema or rash.   Neurological:      Mental Status: She is alert and oriented to person, place, and time.      Cranial Nerves: No cranial nerve deficit.      Sensory: No sensory deficit.      Motor: No weakness.      Coordination: Coordination normal.      Gait: Gait normal.   Psychiatric:         Behavior: Behavior normal.         Thought Content: Thought content normal.         Judgment: Judgment normal.                  Assessment and Plan:  1. 35 y.o. pre-menopausal female with right, multicentric grade II IDC (ER 85%, PR 98%, Her2 1+, Hi-67: 6%).  S/P bilateral mastectomy/R SLNB, pT2N0.  She was found to have 3 areas of malignancy: Tumor #1 at 9:00 measured 2.3cm (ER 90%, PR 100%, Her2 2+, FISH: 1.5 with avg #Her2 signals 4.01 (negative), Ki-67: 6%).  Tumor #2 at 6:00 measured 6 mm (ER 96%, PR 100%, Her2 1+, Ki-67 7%). Tumor #3 in the Left outer quadrant measured 2mm (ER and PR 100%, HER2 IHC 2+, Ki-67 17%, HER2 FISH negative); 4 negative SLN. Completed adjuvant AC-Taxol 02/2015.  Started adjuvant tamoxifen in 04/2015 and OFS in 12/2015 for rising E2. Zoladex discontinued 09/2020 due to bothersome side effects and she was nearing 5 years of OFS. She has continued on tamoxifen. Discussed that symptoms will likely improve once she regains her ovarian function, which could still take a few months.  She will continue on tamoxifen for now and will get E2, FSH. Once ovarian function resumes she will attempt staying on tamoxifen for an additional 6 months to see if symptoms improve.   Given young age at diagnosis if she can tolerate would suggest endocrine therapy for 7-8 years if she can tolerate it.     2. Hot flashes:  Effexor at 112.5 mg PO QDAY and Gabapentin 300  qam and 600 mg PO QHS. Continue at this time, can try to titrate off as menopausal symptoms improve.      3. Genetic testing. [employer] 26 gene panel testing was negative.    4. Anxiety managed by PCP, now on Topamax with good results.     RTC in 6 months        Francie Massing, APRN-NP      I personally interviewed and examined the patient. I have reviewed the history, physical, impression and plan outlined by the nurse practitioner.     The patient presents with Stage 2 breast cancer.     On examination there is:   This is a  35 y.o.   female   HEENT:  No icterus   Neck:  supple.   Adenopathy:  None.  CV:  RR  Breasts:  As above.   Abdomen: Soft, non-distended   Skin: No rash.   Back:  No tenderness   Extremities: No edema.   Neuro: A&O x3    My impression is:   HR+ breast cancer     My plan is:  Stopped OFS  Monitor symptoms off OFS, check E2 and FSH  Continue with tamoxifen  RTC in 6 motnhs    Total Time Today was 30 minutes in the following activities: Preparing to see the patient, Performing a medically appropriate examination and/or evaluation, Counseling and educating the patient/family/caregiver, Referring and communication with other health care professionals (when not separately reported) and Documenting clinical information in the electronic or other health record  .     Osborn Coho, MD

## 2021-02-24 ENCOUNTER — Encounter: Admit: 2021-02-24 | Discharge: 2021-02-24 | Payer: BC Managed Care – PPO

## 2021-02-25 ENCOUNTER — Encounter: Admit: 2021-02-25 | Discharge: 2021-02-25 | Payer: BC Managed Care – PPO

## 2021-02-25 DIAGNOSIS — Z79810 Long term (current) use of selective estrogen receptor modulators (SERMs): Secondary | ICD-10-CM

## 2021-02-25 DIAGNOSIS — G43909 Migraine, unspecified, not intractable, without status migrainosus: Secondary | ICD-10-CM

## 2021-02-25 DIAGNOSIS — C50411 Malignant neoplasm of upper-outer quadrant of right female breast: Secondary | ICD-10-CM

## 2021-02-25 DIAGNOSIS — R112 Nausea with vomiting, unspecified: Secondary | ICD-10-CM

## 2021-02-25 DIAGNOSIS — Z9221 Personal history of antineoplastic chemotherapy: Secondary | ICD-10-CM

## 2021-02-25 DIAGNOSIS — Z789 Other specified health status: Secondary | ICD-10-CM

## 2021-02-25 DIAGNOSIS — K929 Disease of digestive system, unspecified: Secondary | ICD-10-CM

## 2021-02-25 DIAGNOSIS — F419 Anxiety disorder, unspecified: Secondary | ICD-10-CM

## 2021-02-25 DIAGNOSIS — R1319 Other dysphagia: Secondary | ICD-10-CM

## 2021-02-25 DIAGNOSIS — K219 Gastro-esophageal reflux disease without esophagitis: Secondary | ICD-10-CM

## 2021-02-25 DIAGNOSIS — C50911 Malignant neoplasm of unspecified site of right female breast: Secondary | ICD-10-CM

## 2021-02-25 LAB — FOLLICLE STIMULATING HORMONE: FSH: 6.3 mU/mL

## 2021-02-25 LAB — ESTRADIOL (E2): ESTRADIOL: <15 pg/mL

## 2021-02-25 NOTE — Telephone Encounter
All questions answered except for pain med and something for anxiety. She would like Rx sent to [facility] in [place]

## 2021-02-25 NOTE — Patient Instructions
Team Members:  Dr. Priyanka Sharma, MD  Jaimie Heldstab, APRN  Lisa Her, RN  Kapri Nero, RN    Contact information:   Office phone: [phone]  Office fax: [phone]  After hours phone: [phone]    Address:  [address], [address]  [place], [place] [address]

## 2021-02-27 ENCOUNTER — Encounter: Admit: 2021-02-27 | Discharge: 2021-02-27 | Payer: BC Managed Care – PPO

## 2021-02-27 DIAGNOSIS — Z9013 Acquired absence of bilateral breasts and nipples: Secondary | ICD-10-CM

## 2021-02-27 DIAGNOSIS — Z9889 Other specified postprocedural states: Secondary | ICD-10-CM

## 2021-02-27 MED ORDER — OXYCODONE 5 MG PO TAB
5 mg | ORAL_TABLET | ORAL | 0 refills | 6.00000 days | Status: AC | PRN
Start: 2021-02-27 — End: ?

## 2021-02-27 MED ORDER — OXYCODONE 5 MG PO TAB
5 mg | ORAL_TABLET | ORAL | 0 refills | 6.00000 days | Status: DC | PRN
Start: 2021-02-27 — End: 2021-02-27

## 2021-02-27 MED ORDER — DIAZEPAM 5 MG PO TAB
5 mg | ORAL_TABLET | ORAL | 0 refills | 7.00000 days | Status: DC | PRN
Start: 2021-02-27 — End: 2021-02-27

## 2021-02-27 MED ORDER — DIAZEPAM 5 MG PO TAB
5 mg | ORAL_TABLET | ORAL | 0 refills | 7.00000 days | Status: AC | PRN
Start: 2021-02-27 — End: ?

## 2021-02-27 NOTE — Progress Notes
Requested meds for upcoming surgery

## 2021-03-01 ENCOUNTER — Encounter: Admit: 2021-03-01 | Discharge: 2021-03-01 | Payer: BC Managed Care – PPO

## 2021-03-01 DIAGNOSIS — Z9013 Acquired absence of bilateral breasts and nipples: Secondary | ICD-10-CM

## 2021-03-01 DIAGNOSIS — Z9889 Other specified postprocedural states: Secondary | ICD-10-CM

## 2021-03-01 MED ORDER — OXYCODONE 5 MG PO TAB
5 mg | ORAL_TABLET | ORAL | 0 refills | 6.00000 days | Status: AC | PRN
Start: 2021-03-01 — End: ?

## 2021-03-01 MED ORDER — DIAZEPAM 5 MG PO TAB
5 mg | ORAL_TABLET | ORAL | 0 refills | 7.00000 days | Status: AC | PRN
Start: 2021-03-01 — End: ?

## 2021-03-01 NOTE — Telephone Encounter
PA with clinicals for [facility] submitted on [email or web address].  Immediate approval received. Pharmacy notified of approval.

## 2021-03-01 NOTE — Progress Notes
rx resent, previous did not go through

## 2021-03-03 ENCOUNTER — Encounter: Admit: 2021-03-03 | Discharge: 2021-03-03 | Payer: BC Managed Care – PPO

## 2021-03-03 NOTE — Progress Notes
Pain medication order was resent on Monday, 12/5.

## 2021-03-05 ENCOUNTER — Ambulatory Visit: Admit: 2021-03-05 | Discharge: 2021-03-05 | Payer: BC Managed Care – PPO

## 2021-03-05 ENCOUNTER — Encounter: Admit: 2021-03-05 | Discharge: 2021-03-05 | Payer: BC Managed Care – PPO

## 2021-03-05 DIAGNOSIS — Z789 Other specified health status: Secondary | ICD-10-CM

## 2021-03-05 DIAGNOSIS — R1319 Other dysphagia: Secondary | ICD-10-CM

## 2021-03-05 DIAGNOSIS — F419 Anxiety disorder, unspecified: Secondary | ICD-10-CM

## 2021-03-05 DIAGNOSIS — R112 Nausea with vomiting, unspecified: Secondary | ICD-10-CM

## 2021-03-05 DIAGNOSIS — C50911 Malignant neoplasm of unspecified site of right female breast: Secondary | ICD-10-CM

## 2021-03-05 DIAGNOSIS — G43909 Migraine, unspecified, not intractable, without status migrainosus: Secondary | ICD-10-CM

## 2021-03-05 DIAGNOSIS — K219 Gastro-esophageal reflux disease without esophagitis: Secondary | ICD-10-CM

## 2021-03-05 DIAGNOSIS — Z421 Encounter for breast reconstruction following mastectomy: Secondary | ICD-10-CM

## 2021-03-05 DIAGNOSIS — Z9221 Personal history of antineoplastic chemotherapy: Secondary | ICD-10-CM

## 2021-03-05 DIAGNOSIS — K929 Disease of digestive system, unspecified: Secondary | ICD-10-CM

## 2021-03-05 NOTE — Progress Notes
Subjective:       History of Present Illness  Debra Fleming is a 35 y.o. female who presents with concerns about chest wall contours.  She is to undergo liposuction revision if the chest wall.       Review of Systems   Constitutional: Negative.    HENT: Negative.    Eyes: Negative.    Respiratory: Negative.    Cardiovascular: Negative.    Gastrointestinal: Negative.    Endocrine: Negative.    Genitourinary: Negative.    Musculoskeletal: Negative.    Skin: Negative.    Allergic/Immunologic: Negative.    Neurological: Negative.    Hematological: Negative.    Psychiatric/Behavioral: Negative.          Objective:         ? ALPRAZolam (XANAX) 0.5 mg tablet TAKE 1 TABLET BY MOUTH TWICE DAILY as needed for anxiety   ? baclofen 5 mg tablet Take one tablet by mouth at bedtime daily.   ? diazePAM (VALIUM) 5 mg tablet Take one tablet by mouth every 6 hours as needed for Anxiety. Indications: anxious, muscle spasm   ? doxycycline monohydrate (MONODOX) 100 mg capsule Take 100 mg by mouth daily.   ? gabapentin (NEURONTIN) 100 mg capsule take 2 capsules BY MOUTH EVERY 8 HOURS   ? gabapentin (NEURONTIN) 300 mg capsule take 3 capsules BY MOUTH at bedtime DAILY   ? galcanezumab-gnlm (EMGALITY PEN) 120 mg/mL subcutaneous PEN Inject 1 mL under the skin every 30 days.   ? GEMTESA 75 mg tablet Take 75 mg by mouth daily.   ? levocetirizine (XYZAL) 5 mg tablet Take 5 mg by mouth at bedtime daily.   ? lisinopriL (ZESTRIL) 20 mg tablet Take 20 mg by mouth daily.   ? ONAbotulinum toxin A (BOTOX) 100 Units/1 mL injection Inject into the muscle as directed. For Migraines.   ? ondansetron (ZOFRAN ODT) 4 mg rapid dissolve tablet Dissolve one tablet by mouth every 6 hours as needed. Place on tongue to dissolve.  Indications: prevent nausea and vomiting after surgery   ? oxyCODONE (ROXICODONE) 5 mg tablet Take one tablet by mouth every 6 hours as needed for Pain.   ? QUEtiapine (SEROQUEL) 50 mg tablet    ? tamoxifen (NOLVADEX) 20 mg tablet TAKE ONE TABLET BY MOUTH ONCE DAILY   ? topiramate (TOPAMAX) 100 mg tablet Take 100 mg by mouth every 12 hours.   ? VIIBRYD 20 mg tablet TAKE 1 TABLET BY MOUTH DAILY. must take with A meal/food     Vitals:    03/05/21 0904   PainSc: Zero     There is no height or weight on file to calculate BMI.     Physical Exam  The areas were marked in the standing position.  She was placed on the procedure table and the chest was prepped and draped in the usual manner.  The area to be treated was infiltrated with tumescent solution after the stab incisions were infiltrated with 1% Xylocaine with epinephrine.  After waiting approximately 15 minutes the chest was suctioned using a 3 mm cannula.  Approximately 450 cc was aspirated.  This improve the contours.  The access points were then closed with 5-0 gut suture.  The areas were dressed with antibiotic ointment ABD pads and a chest binder.       Assessment and Plan:  She was discharged and had previously been given a prescription for oxycodone.  She will be followed up in the clinic in 3  months.

## 2021-03-08 ENCOUNTER — Encounter: Admit: 2021-03-08 | Discharge: 2021-03-08 | Payer: BC Managed Care – PPO

## 2021-03-31 NOTE — Progress Notes
Obtained patient's verbal consent to treat them and their agreement to [facility] financial policy and NPP via this telehealth visit during the [employer] Emergency  Zoom Consent: Audio + Video utilized  This visit was completed via Zoom due to the restrictions of the COVID-19 pandemic. All issues documented were discussed and addressed but no physical exam was performed unless allowed by visual confirmation on Zoom. If it was felt that the patient should be evaluated in clinic then they were directed there. Patient verbally consented to visit and understands diagnostic limitations of not having ability to complete full physical examination.       Date of Service: 04/02/2021    CC: Migraines Follow Up    History of Present Illness    Debra Fleming is a 36 year old Female  who is a patient of Dr. Verna Czech who presents to clinic today for routine follow up on chronic migraines. Please see below for any updates or changes to current migraine characteristics.     Onset: Childhood but worsened in the last 4 years with chemotherapy  Location: bilateral frontotemporal and into the neck- same  Quality: pressure (in frontal) constant, sharp (with severe migraines), tension (neck/occipital)   Severity: severe migraines are 6-7/10  Average number of headache days per month: Today she reports averaging 1 migraine per month  Duration: 24-36 hours  Aggravating symptoms: phonophobia, osmophobia, photophobia tingling/numbness in upper brow and moved into cheeks and lips (this has significantly improved since starting Botox and Emgality)  Denies: lacrimation/rhinorrhea, positional changes, ringing/roaring of the ears, nausea, vomiting  Triggers: weather changes, stress, same day migraine w/ Botox  Aura: denies  Sleep/snoring: She does snore. She takes sleeping medication. She has trouble initiating sleep and staying asleep. Previously on Ambien. PCP just switched her over Seroquel. Migraines do not wake her up out of her sleep. Last Eye Exam: yearly. Reports normal    Current Migraine Medications:   Topiramate 100mg  daily (outside provider)- changed recently from BID to daily due to concerns for impaired sleep. Prescribed for migraines and anxiety.  Botox Q11 weeks- works great for reducing frequency and severity of migraines. Q11 weeks due to wearing off effect  Emgality 120mg  monthly CGRP- denies concerns. Works well  Baclofen 5mg  HS- not very beneficial-      Previous tried/failed medications:   Sumatriptan - Effective but mildly bothersome side effects  Amitriptyline - not effective  Metoprolol - not effective   Rizatriptan  Venlafaxine- no benefit          Medical History:   Diagnosis Date   ? Acid reflux     chemo induced   ? Anxiety disorder    ? Gastrointestinal disorder    ? History of chemotherapy 2017   ? Limb alert care status 6/16    right limb alert   ? 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 Migraines    ? Other dysphagia    ? PONV (postoperative nausea and vomiting)      Surgical History:   Procedure Laterality Date   ? LIPOMA RESECTION  2004    back   ? BREAST BIOPSY Right 09/11/2014   ? BILATERAL NIPPLE-SPARING MASTECTOMIES, RIGHT SENTINEL LYMPH NODE BIOPSY Bilateral 10/22/2014    Performed by Cordelia Poche, MD at [facility] [place]   ? INSERTION TISSUE EXPANDER BREAST Bilateral 10/22/2014    Performed by Corliss Skains, MD at [facility] OR   ? INSERTION VENOUS ACCESS PORT  Left 10/22/2014    Performed by Simonne Martinet, MD at [facility]   ? TUNNELED VENOUS PORT REMOVAL  2017   ? RECONSTRUCTION BREAST/ BILATERAL TISSUE EXPANDER REMOVAL/ SILICONE IMPLANT PLACEMENT, PORT REMOVAL Bilateral 05/04/2015    Performed by Corliss Skains, MD at [facility] OR   ? BILATERAL CAPSULOTOMIES Bilateral 05/04/2015    Performed by Corliss Skains, MD at [facility] [place]   ? INSERTION FAT GRAFT BREAST FROM ABDOMEN Bilateral 10/14/2015    Performed by Corliss Skains, MD at [facility] [place]   ? LEFT BREAST MOUND REVISION WITH FAT GRAFTING; RIGHT BREAST RECONSTRUCTION WITH FAT GRAFTING Bilateral 04/01/2016    Performed by Corliss Skains, MD at [facility] [place]   ? Bilateral fat grafting from abdomen and thighs Bilateral 04/01/2016    Performed by Corliss Skains, MD at [facility] [place]   ? BILATERAL BREAST REVISION WITH CASULOTOMY AND IMPLANT EXCHANGE Bilateral 10/12/2016    Performed by Corliss Skains, MD at [facility] [place]   ? LIPOSUCTION TO AXILLARY FOLDS Bilateral 10/12/2016    Performed by Corliss Skains, MD at [facility] [place]   ? TISSUE GRAFT - Bilateral breast fat grafting from abdominal donor site Bilateral 07/26/2017    Performed by Corliss Skains, MD at [facility] OR   ? PERIPROSTHETIC CAPSULECTOMY BREAST-Left lateral capsulectomy with ADM support Left 07/26/2017    Performed by Loney Laurence, Pearletha Furl, MD at [facility] OR   ? REVISION RECONSTRUCTED BREAST Right 07/26/2017    Performed by Corliss Skains, MD at [facility] OR   ? GRAFTING AUTOLOGOUS FAT BY LIPOSUCTION TO BREASTS LEFT 165 CC, RIGHT 168 CC TOTAL 333 CC Bilateral 03/15/2019    Performed by Corliss Skains, MD at [facility] OR   ? GRAFTING AUTOLOGOUS FAT BY LIPOSUCTION TO BREASTS LEFT 165 CC, RIGHT 168 CC TOTAL 333 CC Bilateral 03/15/2019    Performed by Corliss Skains, MD at [facility] OR   ? INSERTION SIENTRA [id number] BREAST PROSTHESIS IN RECONSTRUCTION - IMMEDIATE Bilateral 03/15/2019    Performed by Corliss Skains, MD at [facility] OR   ? GRAFTING AUTOLOGOUS FAT BY LIPOSUCTION TO TRUNK/ BREASTS/ SCALP/ ARMS/ LEGS - 50 CC OR LESS Bilateral 12/09/2019    Performed by Corliss Skains, MD at [facility] OR   ? GRAFTING AUTOLOGOUS FAT BY LIPOSUCTION TO TRUNK/ BREASTS/ SCALP/ ARMS/ LEGS - EACH ADDITIONAL 50 CC Bilateral 12/09/2019    Performed by Corliss Skains, MD at [facility] [place]   ? ADJACENT TISSUE TRANSFER/ REARRANGEMENT 10 SQ CM OR LESS - TORSO Right 12/09/2019    Performed by Corliss Skains, MD at [facility] [place]   ? BREAST RECONSTRUCTION Bilateral 08/17/2020   ? BILATERAL PERI-IMPLANT CAPSULECTOMY BREAST - COMPLETE, INCLUDING REMOVAL ALL INTRACAPSULAR CONTENTS Bilateral 08/17/2020    Performed by Corliss Skains, MD at [facility] OR   ? ABDOMINAL EXCISION EXCESSIVE SKIN/ SUBCUTANEOUS TISSUE - ABDOMEN WITH UMBILICAL TRANSPOSITION AND FASCIAL PLICATION  N/A 08/17/2020    Performed by Corliss Skains, MD at [facility] OR   ? GRAFTING AUTOLOGOUS FAT BY LIPOSUCTION TO BREASTS/ CHEST - 50 CC OR LESS FROM ABDOMEN, FLANKS, HIPS, THIGHS Bilateral 08/17/2020    Performed by Corliss Skains, MD at [facility] OR   ? GRAFTING AUTOLOGOUS FAT BY LIPOSUCTION TO BREASTS/ CHEST - FROM ABDOMEN, FLANKS, HIPS, THIGHS - EACH ADDITIONAL 50 CC  08/17/2020    Performed by Corliss Skains, MD at [facility] OR     Family History   Problem Relation Age  of Onset   ? High Cholesterol Mother    ? Arthritis-rheumatoid Mother    ? Arthritis-rheumatoid Maternal Aunt    ? Cancer-Lung Maternal Grandmother    ? Arthritis-rheumatoid Maternal Grandmother    ? Cancer-Lung Maternal Grandfather    ? Cancer-Uterine Paternal Grandmother    ? Arthritis-rheumatoid Paternal Grandmother    ? Cancer-Prostate Paternal Grandfather    ? Diabetes Paternal Grandfather      Social History     Tobacco Use   ? Smoking status: Former     Packs/day: 0.25     Years: 4.00     Pack years: 1.00     Types: Cigarettes     Quit date: 08/10/2005     Years since quitting: 15.6   ? Smokeless tobacco: Never   Vaping Use   ? Vaping Use: Never used   Substance Use Topics   ? Alcohol use: Not Currently     Alcohol/week: 0.0 standard drinks     Comment: rarely   ? Drug use: No          Review of Systems   Eyes: Positive for photophobia.   Musculoskeletal: Positive for neck pain.   Neurological: Positive for headaches.         Objective:         ? ALPRAZolam (XANAX) 0.5 mg tablet TAKE 1 TABLET BY MOUTH TWICE DAILY as needed for anxiety   ? baclofen 5 mg tablet Take one tablet by mouth at bedtime daily.   ? diazePAM (VALIUM) 5 mg tablet Take one tablet by mouth every 6 hours as needed for Anxiety. Indications: anxious, muscle spasm   ? doxycycline monohydrate (MONODOX) 100 mg capsule Take 100 mg by mouth daily.   ? gabapentin (NEURONTIN) 100 mg capsule take 2 capsules BY MOUTH EVERY 8 HOURS   ? gabapentin (NEURONTIN) 300 mg capsule take 3 capsules BY MOUTH at bedtime DAILY   ? galcanezumab-gnlm (EMGALITY PEN) 120 mg/mL subcutaneous PEN Inject 1 mL under the skin every 30 days.   ? GEMTESA 75 mg tablet Take 75 mg by mouth daily.   ? levocetirizine (XYZAL) 5 mg tablet Take 5 mg by mouth at bedtime daily.   ? lisinopriL (ZESTRIL) 20 mg tablet Take 20 mg by mouth daily.   ? ONAbotulinum toxin A (BOTOX) 100 Units/1 mL injection Inject into the muscle as directed. For Migraines.   ? ondansetron (ZOFRAN ODT) 4 mg rapid dissolve tablet Dissolve one tablet by mouth every 6 hours as needed. Place on tongue to dissolve.  Indications: prevent nausea and vomiting after surgery   ? oxyCODONE (ROXICODONE) 5 mg tablet Take one tablet by mouth every 6 hours as needed for Pain.   ? QUEtiapine (SEROQUEL) 50 mg tablet    ? tamoxifen (NOLVADEX) 20 mg tablet TAKE ONE TABLET BY MOUTH ONCE DAILY   ? topiramate (TOPAMAX) 100 mg tablet Take 100 mg by mouth every 12 hours.   ? VIIBRYD 20 mg tablet TAKE 1 TABLET BY MOUTH DAILY. must take with A meal/food     There were no vitals filed for this visit.  There is no height or weight on file to calculate BMI.     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 Migraines are currently well controlled with Botox and Emgality. Patient denies concerns during today's visit.  Plan:  1. Referral to sleep clinic for ongoing insomnia, snoring, and migraines.  2. Continue  Emgality 120mg  monthly injections for migraine prevention. -- Trial of Emgality discussed. This is a Calcitonin-gene related peptide antagonist to prevent migraines.   -- It should NOT be used in pregnancy or if you are planning to become pregnant, as the risks are NOT known. Stop 5 months prior to conception.  --The main side effects are injection site reaction, rash, pain at injection site, rarely hives or anaphylactic reaction.   --Set medication out 15-30 minutes before to reduce the burning sensation. You may ice area of injection first, then clean with alcohol wipe. You may use clear Benadryl gel after injection to reduce itching or rash.  --We ask you try this medication for 3-4 months due to trials showing it may take this long to notice meaningful benefit for some patients, but you may have response in as early as 2 weeks.    3. Continue Botox injections every 11 weeks for chronic migraine prevention.    4.Increase Baclofen from 5mg  to 10mg  at bedtime for neck tension/spasms. May cause drowsiness/dizziness. Do not combine with alcohol.     5.  Follow up in 1 year or sooner if symptoms worsen.    Total of 22 minutes were spent on the same day of the visit including preparing to see the patient, obtaining   and/or reviewing separately obtained history,  counseling and educating the patient/family/caregiver, ordering medications, tests, or   procedures, referring and communication with other health care professionals, documenting clinical   information in the electronic or other health record, independently interpreting results and communicating   results to the patient/family/caregiver, and care coordination.

## 2021-04-02 ENCOUNTER — Ambulatory Visit: Admit: 2021-04-02 | Discharge: 2021-04-03 | Payer: BC Managed Care – PPO

## 2021-04-02 ENCOUNTER — Encounter: Admit: 2021-04-02 | Discharge: 2021-04-02 | Payer: BC Managed Care – PPO

## 2021-04-02 DIAGNOSIS — G479 Sleep disorder, unspecified: Secondary | ICD-10-CM

## 2021-04-02 DIAGNOSIS — G43909 Migraine, unspecified, not intractable, without status migrainosus: Secondary | ICD-10-CM

## 2021-04-02 DIAGNOSIS — G43709 Chronic migraine without aura, not intractable, without status migrainosus: Secondary | ICD-10-CM

## 2021-04-02 DIAGNOSIS — R112 Nausea with vomiting, unspecified: Secondary | ICD-10-CM

## 2021-04-02 DIAGNOSIS — Z789 Other specified health status: Secondary | ICD-10-CM

## 2021-04-02 DIAGNOSIS — R1319 Other dysphagia: Secondary | ICD-10-CM

## 2021-04-02 DIAGNOSIS — F5101 Primary insomnia: Secondary | ICD-10-CM

## 2021-04-02 DIAGNOSIS — K929 Disease of digestive system, unspecified: Secondary | ICD-10-CM

## 2021-04-02 DIAGNOSIS — C50911 Malignant neoplasm of unspecified site of right female breast: Secondary | ICD-10-CM

## 2021-04-02 DIAGNOSIS — K219 Gastro-esophageal reflux disease without esophagitis: Secondary | ICD-10-CM

## 2021-04-02 DIAGNOSIS — M542 Cervicalgia: Secondary | ICD-10-CM

## 2021-04-02 DIAGNOSIS — Z9221 Personal history of antineoplastic chemotherapy: Secondary | ICD-10-CM

## 2021-04-02 DIAGNOSIS — F419 Anxiety disorder, unspecified: Secondary | ICD-10-CM

## 2021-04-02 MED ORDER — BACLOFEN 10 MG PO TAB
10 mg | ORAL_TABLET | Freq: Every evening | ORAL | 11 refills | Status: AC
Start: 2021-04-02 — End: ?

## 2021-04-05 ENCOUNTER — Encounter: Admit: 2021-04-05 | Discharge: 2021-04-05 | Payer: BC Managed Care – PPO

## 2021-04-05 MED ORDER — GABAPENTIN 100 MG PO CAP
ORAL_CAPSULE | Freq: Three times a day (TID) | 3 refills | Status: AC
Start: 2021-04-05 — End: ?

## 2021-04-17 ENCOUNTER — Encounter: Admit: 2021-04-17 | Discharge: 2021-04-17 | Payer: BC Managed Care – PPO

## 2021-04-29 ENCOUNTER — Encounter: Admit: 2021-04-29 | Discharge: 2021-04-29 | Payer: BC Managed Care – PPO

## 2021-04-29 ENCOUNTER — Ambulatory Visit: Admit: 2021-04-29 | Discharge: 2021-04-29 | Payer: BC Managed Care – PPO

## 2021-04-29 VITALS — BP 125/85 | Temp 84.00000°F

## 2021-04-29 DIAGNOSIS — R1319 Other dysphagia: Secondary | ICD-10-CM

## 2021-04-29 DIAGNOSIS — K219 Gastro-esophageal reflux disease without esophagitis: Secondary | ICD-10-CM

## 2021-04-29 DIAGNOSIS — F419 Anxiety disorder, unspecified: Secondary | ICD-10-CM

## 2021-04-29 DIAGNOSIS — G43709 Chronic migraine without aura, not intractable, without status migrainosus: Principal | ICD-10-CM

## 2021-04-29 DIAGNOSIS — Z9221 Personal history of antineoplastic chemotherapy: Secondary | ICD-10-CM

## 2021-04-29 DIAGNOSIS — Z789 Other specified health status: Secondary | ICD-10-CM

## 2021-04-29 DIAGNOSIS — C50911 Malignant neoplasm of unspecified site of right female breast: Secondary | ICD-10-CM

## 2021-04-29 DIAGNOSIS — G43909 Migraine, unspecified, not intractable, without status migrainosus: Secondary | ICD-10-CM

## 2021-04-29 DIAGNOSIS — K929 Disease of digestive system, unspecified: Secondary | ICD-10-CM

## 2021-04-29 DIAGNOSIS — R112 Nausea with vomiting, unspecified: Secondary | ICD-10-CM

## 2021-04-29 MED ORDER — ONABOTULINUMTOXINA 100 UNIT IJ SOLR
200 [IU] | Freq: Once | INTRAMUSCULAR | 0 refills | Status: CP
Start: 2021-04-29 — End: ?
  Administered 2021-04-29: 17:00:00 195 [IU] via INTRAMUSCULAR

## 2021-04-29 NOTE — Progress Notes
Subjective: Debra Fleming is a 36 y.o. female here today for Botox for chronic migraines. Prior to injections, they experienced 20+?headache days per month with an average duration of 4+?hours. They are located holocephalic. They are associated with nausea, photophobia, phonophobia. Pain scale?5+/10. Modalities for relief tried?rizatriptan, topiramate, venlafaxine, botox, sumatriptan, amitriptyline, metoprolol.    Currently they have 5 headache days a month, no migraines since last botox, lasting on average 4+ hours traditionally.    Do you understand the risks of botox as discussed in the consent and wish to proceed?   Patient states yes    Are you currently pregnant or trying to become pregnant, as botox is not indicated while pregnant? No    There were no vitals filed for this visit.    Botox Injection Procedure  Previous Injection Date: 02/08/21    Information given verbally to the patient  today  included, but was not limited to:    Most common side effects: neck pain, headache, eyelid ptosis, migraine, muscular weakness, musculoskeletal stiffness, bronchitis, injection-site pain, musculoskeletal pain, myalgia, facial paresis, hypertension, muscle spasms; infection at injections sites, bruising, bleeding    Most serious side effects/risks: anaphylaxis, dysphagia,  arrhythmia, myocardial infarction, and in some cases, spontaneous death    Consent form was signed.    Confirmed: patient, procedure, side, site, safety procedures followed.     Performed by:  [facility]     Preparation: no contraindications noted to Botox, possible medications prior to procedure:  topical numbing cream prior to procedure, Preparation of site with alcohol    Procedure performed:   Indication: Chronic Migraine Headaches  Medication: Onabotulinum toxin A ?2.50ml normal saline as diluent /100 units (Botulinum Toxin 5 units per 0.3mL, 200 units prepared, 195 units used,?5 units of waste)  ?  Location: PREEMPT protocol Jerrell Belfast SK, and coauthors. Cephalalgia, 2010;30:793)  Muscles/Sites Injected  A - Bilateral Corrugator - 10 units divided in 2 sites  B - Midline Procerus - 5 units in 1 site  C - Bilateral Frontalis - 20 units divided in 4 sites, additional 10 units divided in 2 sites  D - Bilateral Temporalis - 40 units divided in 8 sites  E - Bilateral Occipitalis - 30 units divided in 6 sites, additional 10 units divided in 2 sites  F - Bilateral Cervical Paraspinals - 20 units divided in 4 sites  G - Bilateral Trapezius - 30 units divided in 6 sites, additional 20 units divided in 4 sites  ?  Total Dose - 195 Units divided in 39?sites    Lot/Expiration: [id number] 05/2023 (both vials)    Procedure tolerated: well.    Complications: none.  Diagnosis - Chronic Migraine Headaches   Course:  Progressing as expected.    Counseled: Patient/Family, Regarding diagnosis, Regarding treatment, Regarding medications. If any serious side effects occur, the patient has been instructed to go to the nearest emergency room and call our office.    Follow up: as scheduled prior to next Botox administration.

## 2021-05-25 IMAGING — CR LOW_EXM
3 series · 3 of 3 positions shown · non-contrast
Comparison: none

[ankle ap]
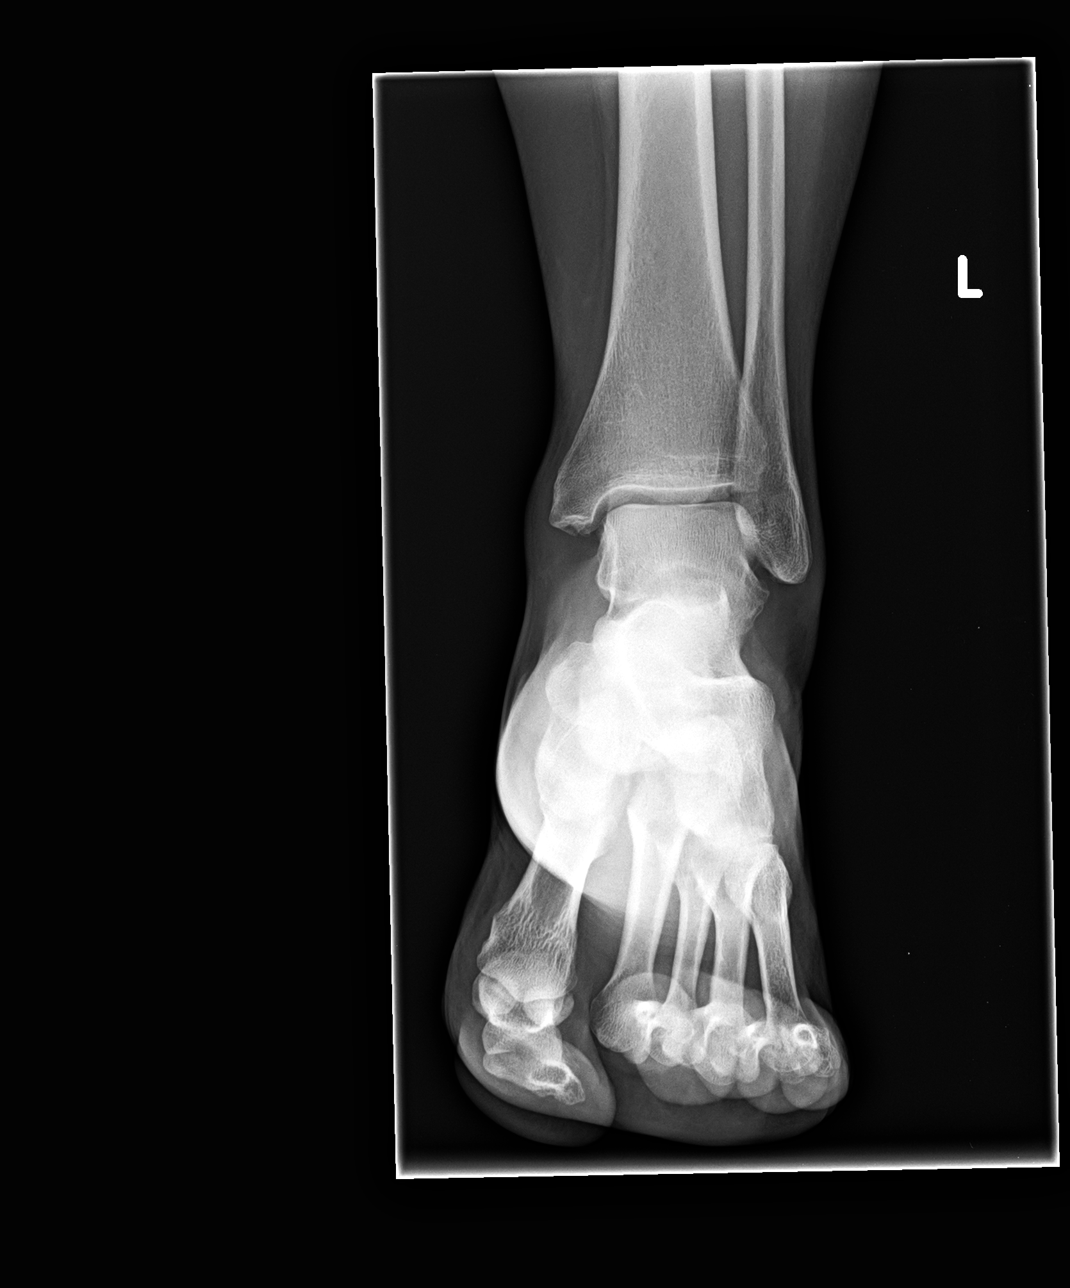

[ankle obl]
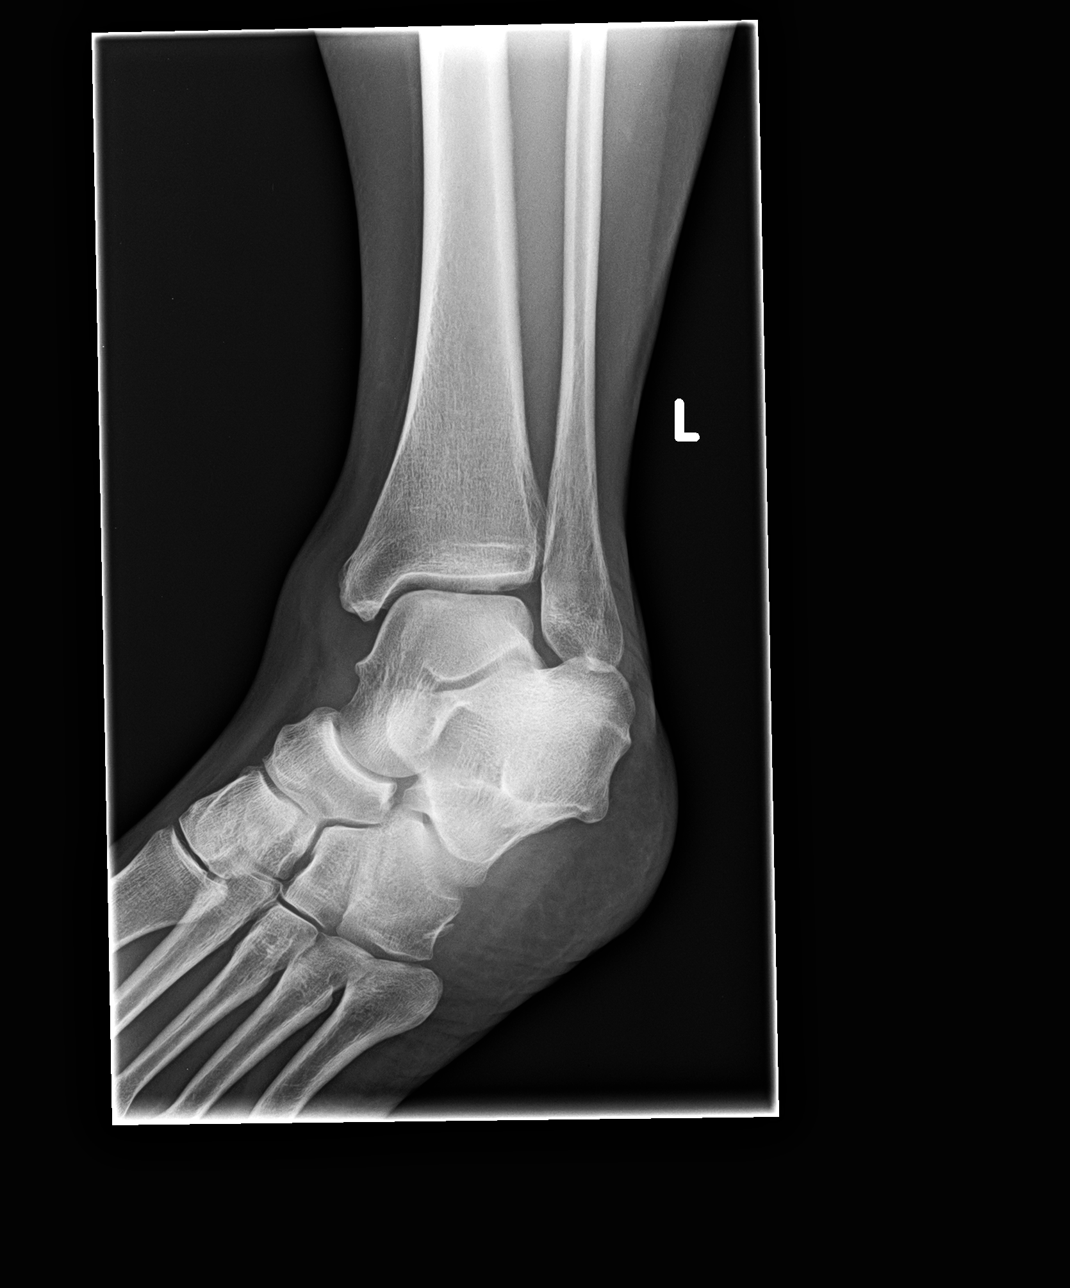

[ankle lat]
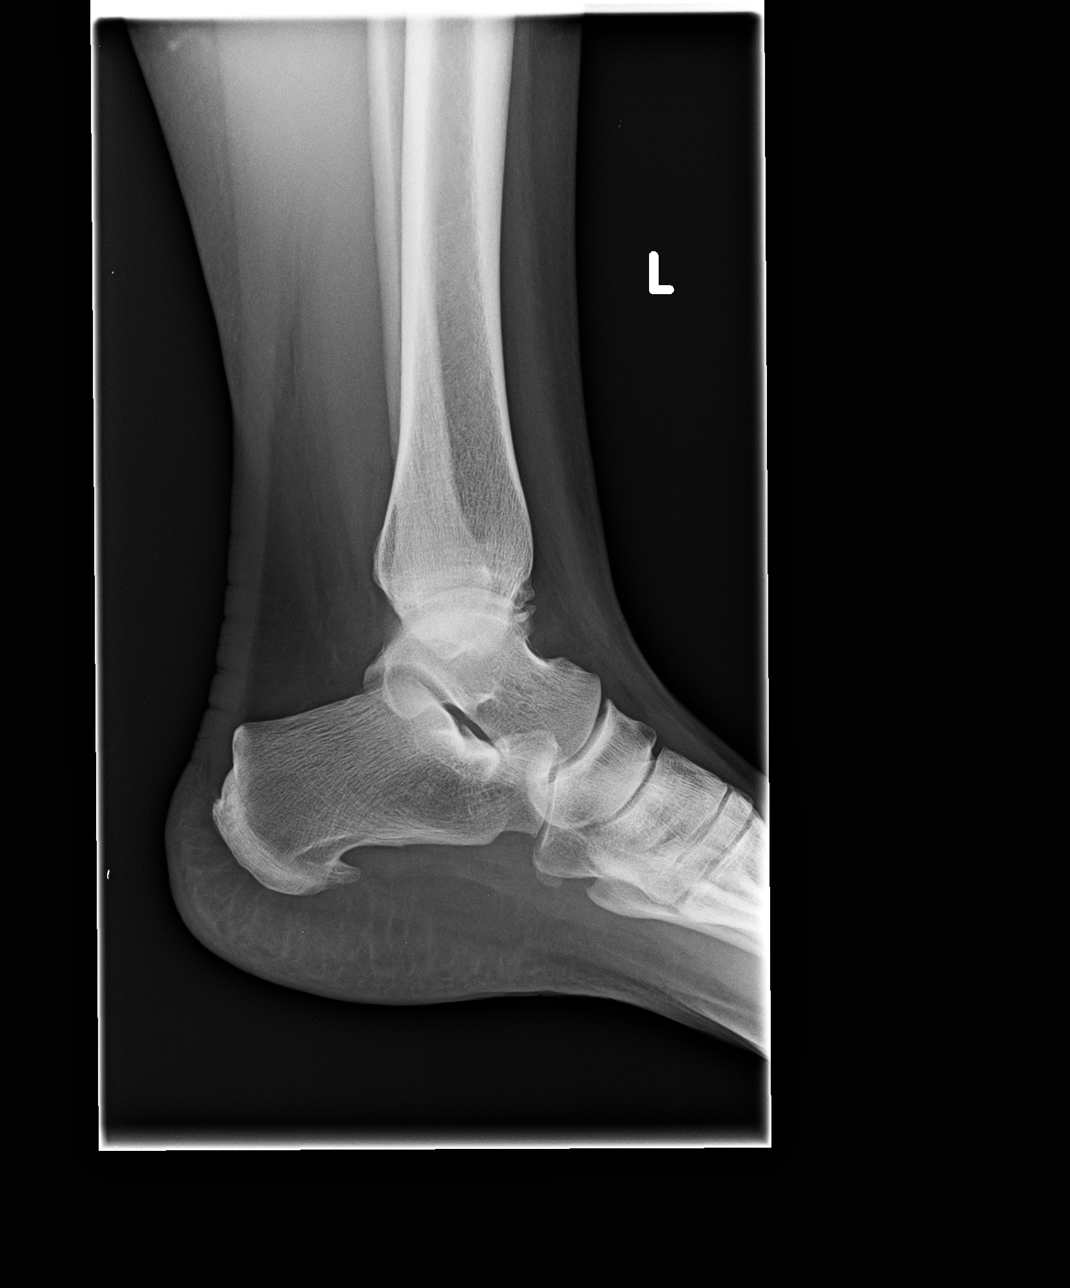

[3 of 3 positions shown; findings below may reference images not displayed]

EXAM

RADIOLOGIC EXAMINATION, ANKLE; 3 OR MORE VIEWS, COMPLETE CPT 37205

INDICATION

Left ankle pain and swelling, injured in February 2019, pain is medial.

TECHNIQUE

AP, lateral and oblique views of the   left ankle were acquired.

COMPARISONS

There are no previous examinations available for comparison at the time of dictation.

FINDINGS

AP, lateral, and mortise views of the left ankle show no displaced fractures or dislocations. Ankle
mortise and syndesmosis appear intact. There is mild soft tissue swelling medially.

Lateral view demonstrates degenerative change along the tibiotalar joint anteriorly. Calcaneal
spurs are noted. Bony mineralization appears within normal limits.

IMPRESSION

Osteophytic spurring and degenerative change along the tibiotalar joint anteriorly. Medial soft
tissue swelling. Prominent plantar calcaneal spur.

Tech Notes:

left ankle pain/swelling, injured in February 2019, pain is medial

## 2021-06-04 ENCOUNTER — Encounter: Admit: 2021-06-04 | Discharge: 2021-06-04 | Payer: BC Managed Care – PPO

## 2021-06-04 MED ORDER — GABAPENTIN 300 MG PO CAP
900 mg | ORAL_CAPSULE | Freq: Every evening | ORAL | 3 refills
Start: 2021-06-04 — End: ?

## 2021-06-11 ENCOUNTER — Encounter: Admit: 2021-06-11 | Discharge: 2021-06-11 | Payer: BC Managed Care – PPO

## 2021-06-14 ENCOUNTER — Ambulatory Visit: Admit: 2021-06-14 | Discharge: 2021-06-15 | Payer: BC Managed Care – PPO

## 2021-06-14 ENCOUNTER — Encounter: Admit: 2021-06-14 | Discharge: 2021-06-14 | Payer: BC Managed Care – PPO

## 2021-06-14 DIAGNOSIS — Z789 Other specified health status: Secondary | ICD-10-CM

## 2021-06-14 DIAGNOSIS — F419 Anxiety disorder, unspecified: Secondary | ICD-10-CM

## 2021-06-14 DIAGNOSIS — R112 Nausea with vomiting, unspecified: Secondary | ICD-10-CM

## 2021-06-14 DIAGNOSIS — Z9013 Acquired absence of bilateral breasts and nipples: Secondary | ICD-10-CM

## 2021-06-14 DIAGNOSIS — K219 Gastro-esophageal reflux disease without esophagitis: Secondary | ICD-10-CM

## 2021-06-14 DIAGNOSIS — G43909 Migraine, unspecified, not intractable, without status migrainosus: Secondary | ICD-10-CM

## 2021-06-14 DIAGNOSIS — C50911 Malignant neoplasm of unspecified site of right female breast: Secondary | ICD-10-CM

## 2021-06-14 DIAGNOSIS — R1319 Other dysphagia: Secondary | ICD-10-CM

## 2021-06-14 DIAGNOSIS — C50411 Malignant neoplasm of upper-outer quadrant of right female breast: Secondary | ICD-10-CM

## 2021-06-14 DIAGNOSIS — K929 Disease of digestive system, unspecified: Secondary | ICD-10-CM

## 2021-06-14 DIAGNOSIS — Z9221 Personal history of antineoplastic chemotherapy: Secondary | ICD-10-CM

## 2021-06-14 NOTE — Progress Notes
Date of Service: 06/14/2021    Subjective:             Debra Fleming is a 36 y.o. female.    History of Present Illness  Debra Fleming still has concerns about some central skin laxity  Full activities    Getting shots for weight loss through [facility]  Has helped and has lost 15 lbs     Review of Systems   Constitutional: Negative.    HENT: Negative.    Eyes: Negative.    Respiratory: Negative.    Cardiovascular: Negative.    Gastrointestinal: Negative.    Endocrine: Negative.    Genitourinary: Negative.    Musculoskeletal: Negative.    Skin: Negative.    Allergic/Immunologic: Negative.    Neurological: Negative.    Hematological: Negative.    Psychiatric/Behavioral: Negative.          Objective:         ? ALPRAZolam (XANAX) 0.5 mg tablet TAKE 1 TABLET BY MOUTH TWICE DAILY as needed for anxiety   ? baclofen (LIORESAL) 10 mg tablet Take one tablet by mouth at bedtime daily.   ? diazePAM (VALIUM) 5 mg tablet Take one tablet by mouth every 6 hours as needed for Anxiety. Indications: anxious, muscle spasm   ? doxycycline monohydrate (MONODOX) 100 mg capsule Take one capsule by mouth daily.   ? gabapentin (NEURONTIN) 100 mg capsule take 2 capsules BY MOUTH EVERY 8 HOURS   ? gabapentin (NEURONTIN) 300 mg capsule take 3 capsules BY MOUTH at bedtime DAILY   ? galcanezumab-gnlm (EMGALITY PEN) 120 mg/mL subcutaneous PEN Inject 1 mL under the skin every 30 days.   ? GEMTESA 75 mg tablet Take one tablet by mouth daily.   ? levocetirizine (XYZAL) 5 mg tablet Take one tablet by mouth at bedtime daily.   ? lisinopriL (ZESTRIL) 20 mg tablet Take one tablet by mouth daily.   ? ONAbotulinum toxin A (BOTOX) 100 Units/1 mL injection Inject into the muscle as directed. For Migraines.   ? ondansetron (ZOFRAN ODT) 4 mg rapid dissolve tablet Dissolve one tablet by mouth every 6 hours as needed. Place on tongue to dissolve.  Indications: prevent nausea and vomiting after surgery   ? oxyCODONE (ROXICODONE) 5 mg tablet Take one tablet by mouth every 6 hours as needed for Pain.   ? QUEtiapine (SEROQUEL) 100 mg tablet Take one tablet by mouth at bedtime daily.   ? SAXENDA 3 mg/0.5 mL (18 mg/3 mL) injection PEN    ? tamoxifen (NOLVADEX) 20 mg tablet TAKE ONE TABLET BY MOUTH ONCE DAILY   ? topiramate (TOPAMAX) 100 mg tablet Take one tablet by mouth every 12 hours.   ? VIIBRYD 20 mg tablet TAKE 1 TABLET BY MOUTH DAILY. must take with A meal/food     Vitals:    06/14/21 1538   BP: 124/66   BP Source: Arm, Left Upper   Pulse: 80   Temp: 36.4 ?C (97.6 ?F)   SpO2: 100%   TempSrc: Temporal   PainSc: Zero   Weight: 89.5 kg (197 lb 6.4 oz)   Height: 167.6 cm (5' 5.98)     Body mass index is 31.88 kg/m?Marland Kitchen     Physical Exam  Well healed  Some medial laxity but overall stable  Scars good       Assessment and Plan:  Discussed with weight loss may see more skin laxity  Check if skin laxity worsens

## 2021-06-15 ENCOUNTER — Encounter: Admit: 2021-06-15 | Discharge: 2021-06-15 | Payer: BC Managed Care – PPO

## 2021-06-15 NOTE — Telephone Encounter
MAIN SLEEP COMPLAINT (S) :  MIGRAINE HISTORY, INSOMNIA, FEELS EXHAUSTED ALL DAY LONG.  WAS ON AMBIEN FOR 10 YEARS AND THAT QUIT WORKING.  SHE IS CURRENTLY ON SEROQUEL 100 MG.  EVEN WITH THAT THERE ARE 4 OUT OF 5 NIGHTS THAT SHE WON'T SLEEP AT ALL.  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  9:00 PM - WILL NOT FALL ASLEEP UNTIL 11 OR 12    WHAT TIME DO YOU GET UP?  7:00 AM     HOW MANY HOURS OF SLEEP TO YOU GET? 4-5     HOW OFTEN YOU TAKE NAPS?  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  WILL SLEEP FOR ABOUT AN HOUR.      DO YOU WAKE UP FEELING UNREFRESHED?  YES    DO YOU SNORE?  YES     DO OTHERS COMPLAIN ABOUT YOUR SNORING?  YES     IS IT WORSE ON YOUR BACK OR ON YOUR SIDE?  BACK     HAS ANYONE WITNESSED APNEA EVENTS?  NO     HAVE YOU EVER AWOKEN FROM SLEEP FEELING SHORT OF BREATH OR HAVE A FEELING THAT YOU ARE CHOKING?  NO     DO YOU HAVE MORNING HEADACHES?  CAN BE ALL DAY     DO YOU HAVE MULTIPLE NOCTURNAL AWAKENINGS?  YES     WHAT WAKES YOU UP?  DOESN'T KNOW BUT WILL WAKE UP OFF AND ON AND GET BACK INTO A DEEP SLEEP     HOW MANY TIMES PER NIGHT ARE YOU WAKING UP?  3-4    DO YOU HAVE DAYTIME SLEEPINESS?  YES    HAVE YOU PREVIOUSLY HAD A SLEEP STUDY?  NO       INSOMNIA:    ARE YOU UNABLE TO FALL ASLEEP IN 15 MIN OR LESS?  YES     DO YOU WAKE UP SEVERAL TIMES DURING THE NIGHT AND CANNOT GET BACK TO SLEEP?  YES     DO YOU WAKE UP ONE OR TWO HOURS EARLY IN THE MORNING?  YES     DO YOU HAVE THOUGHTS RACING THROUGH YOUR MIND WHILE TRYING TO FALL ASLEEP?   YES     DO YOU WATCH A CLOCK WHILE TRYING TO SLEEP?  YES     DO YOU HAVE ANXIETY WHICH KEEPS YOU FROM SLEEPING?  YES     DO YOU MUSCLE TENSION WHICH CAN DISRUPT SLEEP ONSET?  NECK DOES TAKE     ARE YOU BOTHERED BY PAIN DURING THE DAY OR AT NIGHT?  NO     DO YOU WAKE UP FEELING STIFF IN THE MORNING OR HAVE SORE ACHY MUSCLES?  YES

## 2021-06-21 ENCOUNTER — Ambulatory Visit: Admit: 2021-06-21 | Discharge: 2021-06-22 | Payer: BC Managed Care – PPO

## 2021-06-21 ENCOUNTER — Encounter: Admit: 2021-06-21 | Discharge: 2021-06-21 | Payer: BC Managed Care – PPO

## 2021-06-21 DIAGNOSIS — R400 Somnolence: Secondary | ICD-10-CM

## 2021-06-21 DIAGNOSIS — F419 Anxiety disorder, unspecified: Secondary | ICD-10-CM

## 2021-06-21 DIAGNOSIS — K219 Gastro-esophageal reflux disease without esophagitis: Secondary | ICD-10-CM

## 2021-06-21 DIAGNOSIS — F5104 Psychophysiologic insomnia: Secondary | ICD-10-CM

## 2021-06-21 DIAGNOSIS — R1319 Other dysphagia: Secondary | ICD-10-CM

## 2021-06-21 DIAGNOSIS — K929 Disease of digestive system, unspecified: Secondary | ICD-10-CM

## 2021-06-21 DIAGNOSIS — Z789 Other specified health status: Secondary | ICD-10-CM

## 2021-06-21 DIAGNOSIS — R112 Nausea with vomiting, unspecified: Secondary | ICD-10-CM

## 2021-06-21 DIAGNOSIS — G43909 Migraine, unspecified, not intractable, without status migrainosus: Secondary | ICD-10-CM

## 2021-06-21 DIAGNOSIS — C50911 Malignant neoplasm of unspecified site of right female breast: Secondary | ICD-10-CM

## 2021-06-21 DIAGNOSIS — Z9221 Personal history of antineoplastic chemotherapy: Secondary | ICD-10-CM

## 2021-06-22 DIAGNOSIS — F5101 Primary insomnia: Secondary | ICD-10-CM

## 2021-06-22 DIAGNOSIS — R0683 Snoring: Secondary | ICD-10-CM

## 2021-06-22 DIAGNOSIS — G479 Sleep disorder, unspecified: Secondary | ICD-10-CM

## 2021-06-22 DIAGNOSIS — G43709 Chronic migraine without aura, not intractable, without status migrainosus: Secondary | ICD-10-CM

## 2021-06-30 ENCOUNTER — Encounter: Admit: 2021-06-30 | Discharge: 2021-06-30 | Payer: BC Managed Care – PPO

## 2021-07-02 ENCOUNTER — Encounter: Admit: 2021-07-02 | Discharge: 2021-07-02 | Payer: BC Managed Care – PPO

## 2021-07-02 NOTE — Telephone Encounter
ew Visit Date: 06/21/2021    Reason for Visit: chronic insomnia, snoring, and daytime sleepiness     Referrals placed: [facility] FU in 3 weeks.      Sleep study/Machine or supplies ordered: HST      Labs ordered: NA      New Medication started: NA      Education provided: NA     Questions: explained sleep study process.    Follow up appointments scheduled: 07/20/21 AT 330

## 2021-07-13 ENCOUNTER — Encounter: Admit: 2021-07-13 | Discharge: 2021-07-13 | Payer: BC Managed Care – PPO

## 2021-07-13 ENCOUNTER — Ambulatory Visit: Admit: 2021-07-13 | Discharge: 2021-07-13 | Payer: BC Managed Care – PPO

## 2021-07-13 DIAGNOSIS — K219 Gastro-esophageal reflux disease without esophagitis: Secondary | ICD-10-CM

## 2021-07-13 DIAGNOSIS — K929 Disease of digestive system, unspecified: Secondary | ICD-10-CM

## 2021-07-13 DIAGNOSIS — G43909 Migraine, unspecified, not intractable, without status migrainosus: Secondary | ICD-10-CM

## 2021-07-13 DIAGNOSIS — C50911 Malignant neoplasm of unspecified site of right female breast: Secondary | ICD-10-CM

## 2021-07-13 DIAGNOSIS — R1319 Other dysphagia: Secondary | ICD-10-CM

## 2021-07-13 DIAGNOSIS — Z9221 Personal history of antineoplastic chemotherapy: Secondary | ICD-10-CM

## 2021-07-13 DIAGNOSIS — M542 Cervicalgia: Secondary | ICD-10-CM

## 2021-07-13 DIAGNOSIS — G43709 Chronic migraine without aura, not intractable, without status migrainosus: Secondary | ICD-10-CM

## 2021-07-13 DIAGNOSIS — Z789 Other specified health status: Secondary | ICD-10-CM

## 2021-07-13 DIAGNOSIS — R112 Nausea with vomiting, unspecified: Secondary | ICD-10-CM

## 2021-07-13 DIAGNOSIS — F419 Anxiety disorder, unspecified: Secondary | ICD-10-CM

## 2021-07-13 MED ORDER — ONABOTULINUMTOXINA 100 UNIT IJ SOLR
195 [IU] | Freq: Once | INTRAMUSCULAR | 0 refills | Status: CP
Start: 2021-07-13 — End: ?
  Administered 2021-07-13: 21:00:00 195 [IU] via INTRAMUSCULAR

## 2021-07-13 MED ORDER — BACLOFEN 10 MG PO TAB
10 mg | ORAL_TABLET | Freq: Two times a day (BID) | ORAL | 11 refills | Status: AC
Start: 2021-07-13 — End: ?

## 2021-07-20 ENCOUNTER — Encounter: Admit: 2021-07-20 | Discharge: 2021-07-20 | Payer: BC Managed Care – PPO

## 2021-08-25 ENCOUNTER — Encounter: Admit: 2021-08-25 | Discharge: 2021-08-25 | Payer: BC Managed Care – PPO

## 2021-08-25 DIAGNOSIS — R400 Somnolence: Secondary | ICD-10-CM

## 2021-08-25 DIAGNOSIS — F5104 Psychophysiologic insomnia: Secondary | ICD-10-CM

## 2021-08-25 DIAGNOSIS — R0683 Snoring: Secondary | ICD-10-CM

## 2021-08-27 ENCOUNTER — Encounter: Admit: 2021-08-27 | Discharge: 2021-08-27 | Payer: BC Managed Care – PPO

## 2021-08-27 NOTE — Telephone Encounter
Called patient and let her know HST did not show OSA and Dr. Ardelia Fleming is recommending an in lab study for evaluation.     Patient is hesitant to go forward with in lab study with home sleep study not showing OSA. Let her know a home sleep study is not as accurate as the in lab study and and in lab study can give [place] further information. Patient would like to discuss this with Dr. Ardelia Fleming at her follow up in August and decide then if she would like to pursue an in lab study. Patient endorsed she is sleeping better. Offered patient early return appointment, but her schedule did not allow for this so she kept her original appointment time.    Debra Fleming stated her sleep has improved some with the schedule she has been following recommend by Dr. Ardelia Fleming.

## 2021-08-27 NOTE — Telephone Encounter
-----   Message from Aileen Pilot, MD sent at 08/27/2021 10:25 AM CDT -----  Home sleep test did not show OSA. Next step for evaluation is in lab study. If patient agreeable to proceeding, an order can be placed.    Thank you.  ----- Message -----  From: Rozelle Logan  Sent: 08/25/2021  11:44 AM CDT  To: Aileen Pilot, MD    [facility] HST   AHI 4% 0.6 Supine 0.3  AHI 3% 0.6 Supine 0.3

## 2021-10-06 NOTE — Patient Instructions
Do NOT massage or apply pressure on the treated area for 4hrs after treatment since Botox may migrate to areas of undesirable effectiveness.   Do NOT lie down for 4 hours after treatment. This is to avoid the risk of pressure on the treated areas. Also Do NOT lean forward.  Avoid rigorous exercise/activities, extensive heat (eg. sauna, hot tub, tanning) and sun exposure, and alcoholic beverages for the first 24 hours after treatment. This may cause temporary redness, swelling, and/or itching at the injection sites. Feel free to shower and go about most other daily activities.  You may experience a mild headache after Botox. Should this occur, we recommend you avoid aspirin or aspirin containing products. You may opt instead to use acetaminophen, and/or cool compresses. Cold compresses may be used 10 minutes on 10 minutes off to reduce swelling 2-3 times per day during the first 1-2 days if needed.    Note that any bumps or marks will go away in a few hours. If you do develop a bruise it will resolve like any other bruises you may have had in about a week. There is occasionally some mild pain, swelling, itching, or redness at the site of injection similar to most other injections. Redness may last for 1-2 days, rarely longer. You may apply cool compresses or take acetaminophen to reduce swelling or discomfort.  Please call the office with any questions or concerns.        It is patient's responsibility to notify the clinic of any insurance changes, at least 2 weeks prior to any Botox injection appointment, to allow for prior authorization update.      Botox Savings Program:  [employer] will reimburse patients up to $1,000 per treatment  [email or web address] or [phone] Option 4 to register and submit claims.  You will need your EOB with patient responsibility listed.  If your EOB does not include CPT and J-code you will want to write those on the EOB to prevent delayed processing.    CPT code: 98119  J-code:  [id number]     -- If you are having acute (new/sudden onset) or severe/worsening neurologic symptoms, please call 911 or seek care in ED.    -- For scheduling of IMAGING/RADIOLOGY, please call [phone] at your convenience to schedule your studies.-    -- If you have internet access/smart phone please contact me through MyChart. This is our preferred method of contact and the most efficient way to contact your healthcare team. If you do not have internet access/smart phone you can call at [phone].   -- Preferred method of communication is through [employer], if the issue cannot wait until your next scheduled follow up.   -- MyChart may be used for non-emergent communication. Emails are not reviewed after hours or over the weekend/holidays/after 4PM. Staff will reply to your email within 24-48 business hours.     -- Please do not leave multiple voicemails for [place], leaving multiple voicemails delay [place] in getting back to you quicker.    -- If you do not hear from [place] within one week of a lab or imaging study being completed, please call/send my chart email to the office to be sure that we have received the results. This is especially challenging when tests are done outside of the [facility] system, as many times results do not make it back to our office for a variety of reasons. In our office no news is good news does not apply. You should hear from [place] with results  for each test.  [facility] lab/imaging results:  Due to the CARES act, results automatically release to MyChart.  Your provider will continue to send you a result note on any labs that he orders.  With these changes you may see your results before your provider does.   Please allow up to 72 hours for review and response to your results.     -- For referrals placed during the visit, if you have not heard from scheduling within one week, please call the call center at [phone] to get scheduling assistance.  -- For radiology referrals placed during the visit, if you have not heard from scheduling within one week, please call the call center at [phone] to get scheduling assistance.    -- For medication refills, please first contact your pharmacy, who will fax a refill authorization request form to our office.  Weekdays only. Allow up to 2 business days for refills. Please plan ahead.  -- Allow one business week for our office to complete any requested paperwork (FMLA, etc.)     -- Our front desk staff, Wilber Bihari, or Marcelino Duster may be reached at [phone] option 1 for scheduling needs.   -- Huntley Dec, RN, Annabelle Harman, RN, or Heather, LPN may be contacted at [phone] for urgent needs. Staff will return your call within 24 business hours.     For Appointments:   -- Please try to arrive early for your appointment time to help facilitate your visit.   -- If you are late to your appointment, we reserve the right to ask you to reschedule or wait until next available time to be seen in fairness to other patients scheduled that day.   -- There are times when we are running behind in clinic. Our goal is to always be on time, however, there are time when unexpected events occur with patients, which may cause a delay. We appreciate your understanding when this occurs.  -- Please stop at the front desk after each visit to make sure we schedule your follow-up visit and print any referrals or labs for you.

## 2021-10-08 ENCOUNTER — Encounter: Admit: 2021-10-08 | Discharge: 2021-10-08 | Payer: BC Managed Care – PPO

## 2021-10-08 ENCOUNTER — Ambulatory Visit: Admit: 2021-10-08 | Discharge: 2021-10-09 | Payer: BC Managed Care – PPO

## 2021-10-08 DIAGNOSIS — F419 Anxiety disorder, unspecified: Secondary | ICD-10-CM

## 2021-10-08 DIAGNOSIS — K929 Disease of digestive system, unspecified: Secondary | ICD-10-CM

## 2021-10-08 DIAGNOSIS — Z9221 Personal history of antineoplastic chemotherapy: Secondary | ICD-10-CM

## 2021-10-08 DIAGNOSIS — R112 Nausea with vomiting, unspecified: Secondary | ICD-10-CM

## 2021-10-08 DIAGNOSIS — G43909 Migraine, unspecified, not intractable, without status migrainosus: Secondary | ICD-10-CM

## 2021-10-08 DIAGNOSIS — G43709 Chronic migraine without aura, not intractable, without status migrainosus: Secondary | ICD-10-CM

## 2021-10-08 DIAGNOSIS — R1319 Other dysphagia: Secondary | ICD-10-CM

## 2021-10-08 DIAGNOSIS — K219 Gastro-esophageal reflux disease without esophagitis: Secondary | ICD-10-CM

## 2021-10-08 DIAGNOSIS — C50911 Malignant neoplasm of unspecified site of right female breast: Secondary | ICD-10-CM

## 2021-10-08 DIAGNOSIS — Z789 Other specified health status: Secondary | ICD-10-CM

## 2021-10-08 MED ORDER — ONABOTULINUMTOXINA 100 UNIT IJ SOLR
195 [IU] | Freq: Once | INTRAMUSCULAR | 0 refills | Status: CP
Start: 2021-10-08 — End: ?
  Administered 2021-10-08: 14:00:00 195 [IU] via INTRAMUSCULAR

## 2021-10-08 NOTE — Procedures
Subjective: Debra Fleming 36 y.o. female is here today for onabotulinum toxin A for chronic migraines.        Onabotulinum toxin A injection for chronic migraines.    Botox Injection # 10 every 11 weeks    Today she reports averaging 1-2 migraines per month with 5-6 headaches per month.      Use of onabotulinum toxin A injections for people with chronic migraine. Discussed reasonable outcomes and the common side effects.   The plan is to be using the PREEMPT paradigm [facility], et. al. Cephalalgia, [phone])  with follow the pain method as needed.   The patient is aware that botox should not be used during pregnancy and should report to [place] immediately if they find out that they are pregnant.    Time Out Performed: Identified correct patient name/DOB, injection sites, medication: onabotulinum toxin A, using alcohol preps at sites.       Vitals:    10/08/21 0908   BP: 119/79   BP Source: Arm, Left Upper   Pulse: 79   SpO2: 98%   PainSc: Zero       Botox Injection Procedure   Previous Injection Date: 07/13/21  Informed consent given to the patient to include, but not limited to:   Most common side effects: neck pain, headache, eyelid ptosis, migraine, muscular weakness, musculoskeletal stiffness, injection-site pain, musculoskeletal pain, myalgia, facial paresis, hypertension, and muscle spasms; infection at injections sites, bruising, bleeding  Most serious side effects/risks: anaphylaxis, dysphagia, arrhythmia, myocardial infarction, and in some cases, spontaneous death    Confirmed: patient, procedure, side, site, safety procedures followed.     Performed by: Sherene Sires, NP   Preparation: no contraindications noted to Botox; possible medications prior to procedure: Emla cream 2.5%/2.5% topically prior to procedure; Preparation of site with alcohol wipes    Procedure performed:   Indication: Chronic Migraine Headaches  Medication: Onabotulinum toxin A  2.5 ml/100 units (onabotulinum toxin A 5 units per 0.47mL, 200 units prepared, units not used were wasted following protocol)    Location: PREEMPT protocol [facility] SK, and coauthors. Cephalalgia, 2010;30:793)  Muscles/Sites Injected  A - Bilateral Corrugator - 10 units divided in 2 sites  B - Midline Procerus - 5 units in 1 site  C - Bilateral Frontalis - 20 units divided in 4 sites, additional 10 units divided in 2 sites  D - Bilateral Temporalis - 40 units divided in 8 sites  E - Bilateral Occipitalis - 30 units divided in 6 sites, additional 10 units divided in 2 sites  F - Bilateral Cervical Paraspinals - 20 units divided in 4 sites  G - Bilateral Trapezius - 30 units divided in 6 sites, additional 20 units divided in 4 sites  ?  Total Dose - 195 Units divided in 39?sites    Lot/Expiration: [id number], 01/2024- both vials     Procedure tolerated: well.     Complications: none.    Diagnosis - Chronic Migraine Headaches   Course:  Progressing as expected.    Counseled:  Patient/Family, Regarding diagnosis, Regarding treatment, Regarding medications. If any serious side effects occur, the patient has been instructed to go to the nearest emergency room and call our office.  Follow up: as scheduled - call with any concerns

## 2021-11-04 ENCOUNTER — Encounter: Admit: 2021-11-04 | Discharge: 2021-11-04 | Payer: BC Managed Care – PPO

## 2021-11-04 DIAGNOSIS — Z79810 Long term (current) use of selective estrogen receptor modulators (SERMs): Secondary | ICD-10-CM

## 2021-11-04 DIAGNOSIS — G43909 Migraine, unspecified, not intractable, without status migrainosus: Secondary | ICD-10-CM

## 2021-11-04 DIAGNOSIS — R112 Nausea with vomiting, unspecified: Secondary | ICD-10-CM

## 2021-11-04 DIAGNOSIS — F419 Anxiety disorder, unspecified: Secondary | ICD-10-CM

## 2021-11-04 DIAGNOSIS — C50911 Malignant neoplasm of unspecified site of right female breast: Secondary | ICD-10-CM

## 2021-11-04 DIAGNOSIS — R200 Anesthesia of skin: Secondary | ICD-10-CM

## 2021-11-04 DIAGNOSIS — N951 Menopausal and female climacteric states: Secondary | ICD-10-CM

## 2021-11-04 DIAGNOSIS — Z789 Other specified health status: Secondary | ICD-10-CM

## 2021-11-04 DIAGNOSIS — K929 Disease of digestive system, unspecified: Secondary | ICD-10-CM

## 2021-11-04 DIAGNOSIS — Z9221 Personal history of antineoplastic chemotherapy: Secondary | ICD-10-CM

## 2021-11-04 DIAGNOSIS — C50411 Malignant neoplasm of upper-outer quadrant of right female breast: Secondary | ICD-10-CM

## 2021-11-04 DIAGNOSIS — K219 Gastro-esophageal reflux disease without esophagitis: Secondary | ICD-10-CM

## 2021-11-04 DIAGNOSIS — R1319 Other dysphagia: Secondary | ICD-10-CM

## 2021-11-04 NOTE — Patient Instructions
Team Members:  Dr. Priyanka Sharma, MD  Jaimie Heldstab, APRN  Lisa Her, RN  Arnetha Silverthorne, RN    Contact information:   Office phone: [phone]  Office fax: [phone]  After hours phone: [phone]    Address:  [address], [address]  [place], [place] [address]

## 2021-11-04 NOTE — Progress Notes
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  Debra Fleming felt a right breast lump in April 2016 and when it started to increase in size, she went to her PCP. Bilateral diagnostic mammogram 09/08/14 ([facility]) revealed a 2.3 cm irregular mass 9:00, 7 cm FTN in the right breast.  There were a few other focal symmetries in the right breast at 10:00, 12:00, and 6:00. In the left breast there was a 7 mm asymmetry in the lower inner quadrant, 7 cm FTN. Bilateral breast ultrasound 09/08/14 ([facility]) revealed in the right breast a 2.3 cm irregular hypoechoic mass at 9:00. There was also a 1.5 cm mass at 10:00, 7 cm FTN. Additional suspicious findings were noted at 6:00, 7 cm FTN measuring 7 mm and 12:00, 4 cm FTN measuring 5 mm. The right axilla appeared normal. In the left breast there was a 7 mm hypoechoic cluster of cysts with no internal vascularity. Right breast biopsy was recommended. A 6 month follow up left breast ultrasound was recommended (BI-RADS 5).      Imaging was repeated at [facility] 09/11/14.  Bilateral breast US 09/11/14 revealed an irregular right breast mass measuring up to 3.5cm at 9:00, 7cm FTN.  There was also an 8mm area at 10:00, 5cm FTN, a 4mm area at 12:00 4cm FTN, 7mm focus at 6:00 4cm FTN, 1cm area at 12:00 2cm FTN, and at 12:30 subareolar ,measuring 2 cm x 0.5 cm.  The left breast revealed a 9x4x31mm mass at 9:30, 2cm FTN.     She had three core needle biopsies 09/11/14.  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  Right breast biopsy at 12:00, 2cm FTN revealed usual ductal hyperplasia, no evidence of malignancy.  Right breast biopsy at 9:00, 7cm FTN revealed grade II IDC (ER 85%, PR 98%, Her2 1+, Hi-67: 6%).    She had a bilateral mastectomy on 10/22/14 which revealed multicentric right, grade II, IDC. Tumor #1 at 9:00 measured 2.3cm (ER 90%, PR 100%, Her2 2+, FISH: 1.5 with avg #Her2 signals 4.01 (negative), Ki-67: 6%).  Tumor #2 at 6:00 measured 6 mm (ER 96%, PR 100%, Her2 1+, Ki-67 7%).  Tumor #3 (found incidentally) in the Left outer quadrant measured 2mm (ER and PR 100%, HER2 IHC 2+, Ki-67 17%, HER2 FISH negative); 4 negative SLN.  There was a distance of 2.5cm between the first and second mass.  Distance between second and the third lesion are unknown.     She received 4 cycles of adjuvant AC (8/18-10/8/16) followed by Taxol 10/24-12/29/16 - last dose was dose dense.    PRESENT THERAPY: Tamoxifen (started 06/2015), zoladex 12/2015-09/2020.    CHANGES SINCE LAST INTERVENTION: Debra Fleming returns to clinic for follow-up. She continues on tamoxifen. Migraines are still severe, seeing Neurology. Implants were removed 08/17/20 and since then she has noticed increased muscle tightness. Her menstrual cycle returned approximately 6 months ago intermittently.            Review of Systems   Constitutional: Positive for fatigue (stable). Negative for unexpected weight change.        Hot flashes     HENT: Negative.    Eyes:  Negative.  Negative for visual disturbance.   Respiratory: Negative.  Negative for cough and shortness of breath.    Cardiovascular: Negative.  Negative for chest pain.   Gastrointestinal: Negative.  Negative for abdominal pain, constipation, diarrhea, nausea and vomiting.   Endocrine: Negative.    Genitourinary: Negative.  Negative for difficulty urinating.   Musculoskeletal: Negative for arthralgias and back pain.   Skin: Negative.  Negative for rash.   Allergic/Immunologic: Negative.    Neurological: Positive for numbness (hands worse) and headaches (daily, gets botox). Negative for dizziness and weakness.   Hematological: Negative.  Negative for adenopathy.   Psychiatric/Behavioral: Positive for sleep disturbance (improved with ambien). The patient is nervous/anxious (stable).        PMH: Negative  FAMILY HX: No family history of breast cancer.  Paternal grandmother (fallopian ca- genetic test negative). Maternal grandmother and grandfather with lung cancer. Maternal uncle throat cancer (age 89).  GYN: Menarche age 55. G2P2, ovaries/uterus intact.  On birth control x10 years, stopped at diagnosis  SOCIAL:  Hair dresser, married, lives in [place]    Objective:         ? ALPRAZolam (XANAX) 0.5 mg tablet TAKE 1 TABLET BY MOUTH TWICE DAILY as needed for anxiety   ? baclofen (LIORESAL) 10 mg tablet Take one tablet by mouth twice daily.   ? gabapentin (NEURONTIN) 100 mg capsule take 2 capsules BY MOUTH EVERY 8 HOURS   ? gabapentin (NEURONTIN) 300 mg capsule take 3 capsules BY MOUTH at bedtime DAILY   ? galcanezumab-gnlm (EMGALITY PEN) 120 mg/mL subcutaneous PEN Inject 1 mL under the skin every 30 days.   ? GEMTESA 75 mg tablet Take one tablet by mouth daily.   ? levocetirizine (XYZAL) 5 mg tablet Take one tablet by mouth at bedtime daily.   ? lisinopriL (ZESTRIL) 20 mg tablet Take one tablet by mouth daily.   ? ONAbotulinum toxin A (BOTOX) 100 Units/1 mL injection Inject into the muscle as directed. For Migraines.   ? ondansetron (ZOFRAN ODT) 4 mg rapid dissolve tablet Dissolve one tablet by mouth every 6 hours as needed. Place on tongue to dissolve.  Indications: prevent nausea and vomiting after surgery   ? oxyCODONE (ROXICODONE) 5 mg tablet Take one tablet by mouth every 6 hours as needed for Pain.   ? QUEtiapine (SEROQUEL) 100 mg tablet Take one tablet by mouth at bedtime daily.   ? SAXENDA 3 mg/0.5 mL (18 mg/3 mL) injection PEN    ? tamoxifen (NOLVADEX) 20 mg tablet TAKE ONE TABLET BY MOUTH ONCE DAILY   ? topiramate (TOPAMAX) 100 mg tablet Take one tablet by mouth every 12 hours.   ? VIIBRYD 20 mg tablet TAKE 1 TABLET BY MOUTH DAILY. must take with A meal/food     Vitals:    11/04/21 1615   BP: (P) 115/79   Pulse: (P) 88 Temp: (P) 36.6 ?C (97.8 ?F)   Resp: (P) 16   SpO2: (P) 100%       Body mass index is 27.29 kg/m? (pended).     Pain Score: Zero     Pain Addressed:  N/A    Patient Evaluated for a Clinical Trial: Patient not eligible for a treatment trial (including not needing treatment, needs palliative care, in remission).     [place] Cooperative Oncology Group performance status is 0, Fully active, able to carry on all pre-disease performance without restriction.Marland Kitchen     Physical Exam  Vitals reviewed.   Constitutional:  General: She is not in acute distress.     Appearance: Normal appearance. She is well-developed. She is not toxic-appearing.   HENT:      Head: Normocephalic and atraumatic.   Eyes:      General: No scleral icterus.        Right eye: No discharge.         Left eye: No discharge.      Conjunctiva/sclera: Conjunctivae normal.   Cardiovascular:      Rate and Rhythm: Normal rate and regular rhythm.      Heart sounds: Normal heart sounds.   Pulmonary:      Effort: Pulmonary effort is normal. No respiratory distress.      Breath sounds: Normal breath sounds. No wheezing.   Chest:       Abdominal:      General: There is no distension.   Musculoskeletal:         General: No tenderness. Normal range of motion.      Cervical back: Normal range of motion.      Right lower leg: No edema.      Left lower leg: No edema.   Lymphadenopathy:      Cervical: No cervical adenopathy.      Upper Body:      Right upper body: No supraclavicular or axillary adenopathy.      Left upper body: No supraclavicular or axillary adenopathy.   Skin:     General: Skin is warm and dry.      Coloration: Skin is not jaundiced or pale.      Findings: No erythema or rash.   Neurological:      Mental Status: She is alert and oriented to person, place, and time.      Cranial Nerves: No cranial nerve deficit.      Sensory: No sensory deficit.      Motor: No weakness.      Coordination: Coordination normal.      Gait: Gait normal.   Psychiatric: Behavior: Behavior normal.         Thought Content: Thought content normal.         Judgment: Judgment normal.                  Assessment and Plan:  1.  36 y.o. pre-menopausal female with right, multicentric grade II IDC (ER 85%, PR 98%, Her2 1+, Hi-67: 6%).  S/P bilateral mastectomy/R SLNB, pT2N0.  She was found to have 3 areas of malignancy: Tumor #1 at 9:00 measured 2.3cm (ER 90%, PR 100%, Her2 2+, FISH: 1.5 with avg #Her2 signals 4.01 (negative), Ki-67: 6%).  Tumor #2 at 6:00 measured 6 mm (ER 96%, PR 100%, Her2 1+, Ki-67 7%). Tumor #3 in the Left outer quadrant measured 2mm (ER and PR 100%, HER2 IHC 2+, Ki-67 17%, HER2 FISH negative); 4 negative SLN. Completed adjuvant AC-Taxol 02/2015.  Started adjuvant tamoxifen in 04/2015 and OFS in 12/2015 for rising E2. Zoladex discontinued 09/2020 due to bothersome side effects and she was nearing 5 years of OFS.   10/2021: She has continued on tamoxifen and regained her menses. She is having bothersome migraines (gets botox) and we discussed taking a 6 week break from tamoxifen. If she has significant improvement can stop tamoxifen. Given young age at diagnosis if she can tolerate would suggest endocrine therapy for 7-8 years if she can tolerate it.     2. Hot flashes:  Effexor at 112.5 mg PO QDAY and Gabapentin 300  qam and 600 mg PO QHS. Recently started on veozah by her Gyn.     3. Genetic testing. [employer] 26 gene panel testing was negative.    4. Migraines, follows in neurology clinic and gets botox.     5. 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 This recent worsening is Not related to taxane since she completed that >6 years ago. Could be carpal tunnel, will refer to Dr. Caryn Section.  Also check B12    RTC in 1 year       [place], APRN-NP      I personally interviewed and examined the patient. I have reviewed the history, physical, impression and plan outlined by the nurse practitioner.     The patient presents with Stage 2 breast cancer.     On examination there is:   This is a  36 y.o.   female   HEENT:  No icterus   Neck:  supple.   Adenopathy:  None.  CV:  RR  Breasts:  As above.   Abdomen: Soft, non-distended   Skin: No rash.   Back:  No tenderness   Extremities: No edema.   Neuro: A&O x3    My impression is:   HR+ breast cancer     My plan is:  Stopped OFS  Hold tamoxifen for 6 weeks  Refer to Hand surgery   B12 level check     Total Time Today was 30 minutes in the following activities: Preparing to see the patient, Performing a medically appropriate examination and/or evaluation, Counseling and educating the patient/family/caregiver, Referring and communication with other health care professionals (when not separately reported) and Documenting clinical information in the electronic or other health record  .     Osborn Coho, MD

## 2021-11-15 ENCOUNTER — Encounter: Admit: 2021-11-15 | Discharge: 2021-11-15 | Payer: BC Managed Care – PPO

## 2021-11-15 ENCOUNTER — Ambulatory Visit: Admit: 2021-11-15 | Discharge: 2021-11-16 | Payer: BC Managed Care – PPO

## 2021-11-15 DIAGNOSIS — G43909 Migraine, unspecified, not intractable, without status migrainosus: Secondary | ICD-10-CM

## 2021-11-15 DIAGNOSIS — F419 Anxiety disorder, unspecified: Secondary | ICD-10-CM

## 2021-11-15 DIAGNOSIS — C50911 Malignant neoplasm of unspecified site of right female breast: Secondary | ICD-10-CM

## 2021-11-15 DIAGNOSIS — G5603 Carpal tunnel syndrome, bilateral upper limbs: Secondary | ICD-10-CM

## 2021-11-15 DIAGNOSIS — Z789 Other specified health status: Secondary | ICD-10-CM

## 2021-11-15 DIAGNOSIS — Z9221 Personal history of antineoplastic chemotherapy: Secondary | ICD-10-CM

## 2021-11-15 DIAGNOSIS — K219 Gastro-esophageal reflux disease without esophagitis: Secondary | ICD-10-CM

## 2021-11-15 DIAGNOSIS — K929 Disease of digestive system, unspecified: Secondary | ICD-10-CM

## 2021-11-15 DIAGNOSIS — R1319 Other dysphagia: Secondary | ICD-10-CM

## 2021-11-15 DIAGNOSIS — R112 Nausea with vomiting, unspecified: Secondary | ICD-10-CM

## 2021-11-15 NOTE — Progress Notes
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 She is here for evaluation of bilateral hand paresthesias. She has a hx of chemotherapy in 2016 for breast cancer and was diagnosed with neuropathy thereafter. She has worsening paresthesias for many years in her bilateral hands. She describes constant paresthesias. She has paresthesias at night. She notes worsening paresthesias with gripping activities. She drops her combs at work a lot. Gripping such as holding the steering wheel cause numbness.      Medical History  Medical History:   Diagnosis Date   ? Acid reflux     chemo induced   ? Anxiety disorder    ? Gastrointestinal disorder    ? History of chemotherapy 2017   ? Limb alert care status 6/16    right limb alert   ? 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 Migraines    ? Other dysphagia    ? PONV (postoperative nausea and vomiting)        Surgical History  Surgical History:   Procedure Laterality Date   ? LIPOMA RESECTION  2004    back   ? BREAST BIOPSY Right 09/11/2014   ? BILATERAL NIPPLE-SPARING MASTECTOMIES, RIGHT SENTINEL LYMPH NODE BIOPSY Bilateral 10/22/2014    Performed by Cordelia Poche, MD at [facility]   ? INSERTION TISSUE EXPANDER BREAST Bilateral 10/22/2014    Performed by Corliss Skains, MD at [facility] [place]   ? INSERTION VENOUS ACCESS PORT Left 10/22/2014    Performed by Simonne Martinet, MD at [facility] OR   ? TUNNELED VENOUS PORT REMOVAL  2017   ? RECONSTRUCTION BREAST/ BILATERAL TISSUE EXPANDER REMOVAL/ SILICONE IMPLANT PLACEMENT, PORT REMOVAL Bilateral 05/04/2015    Performed by Corliss Skains, MD at [facility] OR   ? BILATERAL CAPSULOTOMIES Bilateral 05/04/2015    Performed by Corliss Skains, MD at [facility] OR   ? INSERTION FAT GRAFT BREAST FROM ABDOMEN Bilateral 10/14/2015    Performed by Corliss Skains, MD at [facility] OR   ? LEFT BREAST MOUND REVISION WITH FAT GRAFTING; RIGHT BREAST RECONSTRUCTION WITH FAT GRAFTING Bilateral 04/01/2016    Performed by Corliss Skains, MD at [facility] OR   ? Bilateral fat grafting from abdomen and thighs Bilateral 04/01/2016    Performed by Corliss Skains, MD at [facility] OR   ? BILATERAL BREAST REVISION WITH CASULOTOMY AND IMPLANT EXCHANGE Bilateral 10/12/2016    Performed by Corliss Skains, MD at [facility] OR   ? LIPOSUCTION TO AXILLARY FOLDS Bilateral 10/12/2016    Performed by Corliss Skains, MD at [facility] OR   ? TISSUE GRAFT - Bilateral breast fat grafting from abdominal donor site Bilateral 07/26/2017    Performed by Corliss Skains, MD at [facility] OR   ? PERIPROSTHETIC CAPSULECTOMY BREAST-Left lateral capsulectomy with ADM support Left 07/26/2017    Performed by Loney Laurence, Pearletha Furl, MD at [facility] OR   ? REVISION RECONSTRUCTED BREAST Right 07/26/2017    Performed by Corliss Skains, MD at [facility] OR   ? GRAFTING AUTOLOGOUS FAT BY LIPOSUCTION TO BREASTS LEFT 165 CC, RIGHT 168 CC TOTAL 333 CC Bilateral 03/15/2019    Performed by Corliss Skains, MD at [facility] OR   ? GRAFTING AUTOLOGOUS FAT BY LIPOSUCTION TO BREASTS LEFT 165 CC, RIGHT  168 CC TOTAL 333 CC Bilateral 03/15/2019    Performed by Corliss Skains, MD at [facility] OR   ? INSERTION SIENTRA [id number] BREAST PROSTHESIS IN RECONSTRUCTION - IMMEDIATE Bilateral 03/15/2019    Performed by Corliss Skains, MD at [facility] OR   ? GRAFTING AUTOLOGOUS FAT BY LIPOSUCTION TO TRUNK/ BREASTS/ SCALP/ ARMS/ LEGS - 50 CC OR LESS Bilateral 12/09/2019    Performed by Corliss Skains, MD at [facility] [place]   ? GRAFTING AUTOLOGOUS FAT BY LIPOSUCTION TO TRUNK/ BREASTS/ SCALP/ ARMS/ LEGS - EACH ADDITIONAL 50 CC Bilateral 12/09/2019    Performed by Corliss Skains, MD at [facility] OR   ? ADJACENT TISSUE TRANSFER/ REARRANGEMENT 10 SQ CM OR LESS - TORSO Right 12/09/2019    Performed by Corliss Skains, MD at [facility] [place]   ? BREAST RECONSTRUCTION Bilateral 08/17/2020   ? BILATERAL PERI-IMPLANT CAPSULECTOMY BREAST - COMPLETE, INCLUDING REMOVAL ALL INTRACAPSULAR CONTENTS Bilateral 08/17/2020    Performed by Corliss Skains, MD at [facility] OR   ? ABDOMINAL EXCISION EXCESSIVE SKIN/ SUBCUTANEOUS TISSUE - ABDOMEN WITH UMBILICAL TRANSPOSITION AND FASCIAL PLICATION  N/A 08/17/2020    Performed by Corliss Skains, MD at [facility] OR   ? GRAFTING AUTOLOGOUS FAT BY LIPOSUCTION TO BREASTS/ CHEST - 50 CC OR LESS FROM ABDOMEN, FLANKS, HIPS, THIGHS Bilateral 08/17/2020    Performed by Corliss Skains, MD at [facility] OR   ? GRAFTING AUTOLOGOUS FAT BY LIPOSUCTION TO BREASTS/ CHEST - FROM ABDOMEN, FLANKS, HIPS, THIGHS - EACH ADDITIONAL 50 CC  08/17/2020    Performed by Corliss Skains, MD at [facility] OR       Family History  Family History   Problem Relation Age of Onset   ? High Cholesterol Mother    ? Arthritis-rheumatoid Mother    ? Arthritis-rheumatoid Maternal Aunt    ? Cancer-Lung Maternal Grandmother    ? Arthritis-rheumatoid Maternal Grandmother    ? Cancer-Lung Maternal Grandfather    ? Cancer-Uterine Paternal Grandmother    ? Arthritis-rheumatoid Paternal Grandmother    ? Cancer-Prostate Paternal Grandfather    ? Diabetes Paternal Grandfather        Social History   reports that she quit smoking about 16 years ago. Her smoking use included cigarettes. She has a 1.00 pack-year smoking history. She has never used smokeless tobacco. She reports that she does not currently use alcohol. She reports that she does not use drugs.    Allergies  Allergies   Allergen Reactions   ? Chlorhexidine RASH     CHG dressing   ? Penicillin G HEADACHE and RASH     childhood allergy   ? Scopolamine NAUSEA AND VOMITING       Review of Systems  Review of Systems      Objective:    Medications    Current Outpatient Medications:   ?  ALPRAZolam (XANAX) 0.5 mg tablet, TAKE 1 TABLET BY MOUTH TWICE DAILY as needed for anxiety, Disp: , Rfl:   ?  baclofen (LIORESAL) 10 mg tablet, Take one tablet by mouth twice daily., Disp: 60 tablet, Rfl: 11  ?  gabapentin (NEURONTIN) 100 mg capsule, take 2 capsules BY MOUTH EVERY 8 HOURS, Disp: 180 capsule, Rfl: 3  ?  gabapentin (NEURONTIN) 300 mg capsule, take 3 capsules BY MOUTH at bedtime DAILY, Disp: 270 capsule, Rfl: 3  ?  galcanezumab-gnlm (EMGALITY PEN) 120 mg/mL subcutaneous PEN, Inject 1 mL under the skin every 30 days., Disp: 1 mL, Rfl: 11  ?  GEMTESA 75 mg tablet, Take one tablet by mouth daily., Disp: , Rfl:   ?  lisinopriL (ZESTRIL) 20 mg tablet, Take one tablet by mouth daily., Disp: , Rfl:   ?  ONAbotulinum toxin A (BOTOX) 100 Units/1 mL injection, Inject into the muscle as directed. For Migraines., Disp: , Rfl:   ?  ondansetron (ZOFRAN ODT) 4 mg rapid dissolve tablet, Dissolve one tablet by mouth every 6 hours as needed. Place on tongue to dissolve.  Indications: prevent nausea and vomiting after surgery, Disp: 10 tablet, Rfl: 0  ?  oxyCODONE (ROXICODONE) 5 mg tablet, Take one tablet by mouth every 6 hours as needed for Pain., Disp: 15 tablet, Rfl: 0  ?  QUEtiapine (SEROQUEL) 100 mg tablet, Take one tablet by mouth at bedtime daily., Disp: , Rfl:   ?  SAXENDA 3 mg/0.5 mL (18 mg/3 mL) injection PEN, , Disp: , Rfl:   ?  tamoxifen (NOLVADEX) 20 mg tablet, TAKE ONE TABLET BY MOUTH ONCE DAILY, Disp: 90 tablet, Rfl: 3  ?  topiramate (TOPAMAX) 100 mg tablet, Take one tablet by mouth every 12 hours., Disp: , Rfl:   ?  VIIBRYD 20 mg tablet, TAKE 1 TABLET BY MOUTH DAILY. must take with A meal/food, Disp: , Rfl:     Vitals  There were no vitals filed for this visit.  Ht Readings from Last 1 Encounters:   11/15/21 167.6 cm (5' 6)      Wt Readings from Last 1 Encounters:   11/15/21 74.3 kg (163 lb 12.8 oz)      Body mass index is 26.44 kg/m?Marland Kitchen      Physical Exam  Ortho Exam  Alert, well-appearing female. She is appropriately dressed and interactive. Oriented x 3.  On evaluation of her bilateral hands, Positive Tinel's at the cubital tunnel bilaterally. Positive Phalen's bilaterally. 2 point discrimination is difficult to assess but is moderately impaired. Sensation grossly intact to light touch in the median, ulnar and radial nerve distributions. Fingers well perfused.       Assessment and Plan:     #1 Bilateral carpal tunnel syndrome     I reviewed with Ms. Scheier the pathophysiology associated with carpal tunnel syndrome.  We discussed treatment options. She would like to take time to consider this and will call if she is interested in carpal tunnel release. All of her questions were answered.         ATTESTATION    I have taken down these notes in the presence of Dr. Hart Robinsons.    STAFF NAME: Babette Relic  DATE: 11/15/2021

## 2021-11-16 DIAGNOSIS — R200 Anesthesia of skin: Secondary | ICD-10-CM

## 2021-11-17 ENCOUNTER — Encounter: Admit: 2021-11-17 | Discharge: 2021-11-17 | Payer: BC Managed Care – PPO

## 2021-12-02 ENCOUNTER — Encounter: Admit: 2021-12-02 | Discharge: 2021-12-02 | Payer: BC Managed Care – PPO

## 2021-12-02 MED ORDER — TAMOXIFEN 20 MG PO TAB
20 mg | ORAL_TABLET | Freq: Every day | ORAL | 3 refills | Status: AC
Start: 2021-12-02 — End: ?

## 2021-12-17 ENCOUNTER — Encounter: Admit: 2021-12-17 | Discharge: 2021-12-17 | Payer: BC Managed Care – PPO

## 2021-12-20 ENCOUNTER — Encounter: Admit: 2021-12-20 | Discharge: 2021-12-20 | Payer: BC Managed Care – PPO

## 2021-12-20 NOTE — Telephone Encounter
Spoke with Debra Fleming to discuss her 6 week break from Tamoxifen. She reports improvement in overall symptoms and in her hands. She has decided at this time to stop Tamoxifen. She will FU with Dr. Donneta Romberg in 1 year.

## 2021-12-22 ENCOUNTER — Encounter: Admit: 2021-12-22 | Discharge: 2021-12-22 | Payer: BC Managed Care – PPO

## 2022-01-20 NOTE — Patient Instructions
Do NOT massage or apply pressure on the treated area for 4hrs after treatment since Botox may migrate to areas of undesirable effectiveness.   Do NOT lie down for 4 hours after treatment. This is to avoid the risk of pressure on the treated areas. Also Do NOT lean forward.  Avoid rigorous exercise/activities, extensive heat (eg. sauna, hot tub, tanning) and sun exposure, and alcoholic beverages for the first 24 hours after treatment. This may cause temporary redness, swelling, and/or itching at the injection sites. Feel free to shower and go about most other daily activities.  You may experience a mild headache after Botox. Should this occur, we recommend you avoid aspirin or aspirin containing products. You may opt instead to use acetaminophen, and/or cool compresses. Cold compresses may be used 10 minutes on 10 minutes off to reduce swelling 2-3 times per day during the first 1-2 days if needed.    Note that any bumps or marks will go away in a few hours. If you do develop a bruise it will resolve like any other bruises you may have had in about a week. There is occasionally some mild pain, swelling, itching, or redness at the site of injection similar to most other injections. Redness may last for 1-2 days, rarely longer. You may apply cool compresses or take acetaminophen to reduce swelling or discomfort.  Please call the office with any questions or concerns.        It is patient's responsibility to notify the clinic of any insurance changes, at least 2 weeks prior to any Botox injection appointment, to allow for prior authorization update.      Botox Savings Program:  [employer] will reimburse patients up to $1,000 per treatment  [email or web address] or [phone] Option 4 to register and submit claims.  You will need your EOB with patient responsibility listed.  If your EOB does not include CPT and J-code you will want to write those on the EOB to prevent delayed processing.    CPT code: 98119  J-code:  [id number]     -- If you are having acute (new/sudden onset) or severe/worsening neurologic symptoms, please call 911 or seek care in ED.    -- For scheduling of IMAGING/RADIOLOGY, please call [phone] at your convenience to schedule your studies.-    -- If you have internet access/smart phone please contact me through MyChart. This is our preferred method of contact and the most efficient way to contact your healthcare team. If you do not have internet access/smart phone you can call at [phone].   -- Preferred method of communication is through [employer], if the issue cannot wait until your next scheduled follow up.   -- MyChart may be used for non-emergent communication. Emails are not reviewed after hours or over the weekend/holidays/after 4PM. Staff will reply to your email within 24-48 business hours.     -- Please do not leave multiple voicemails for [place], leaving multiple voicemails delay [place] in getting back to you quicker.    -- If you do not hear from [place] within one week of a lab or imaging study being completed, please call/send my chart email to the office to be sure that we have received the results. This is especially challenging when tests are done outside of the [facility] system, as many times results do not make it back to our office for a variety of reasons. In our office no news is good news does not apply. You should hear from [place] with results  for each test.  [place] lab/imaging results:  Due to the CARES act, results automatically release to MyChart.  Your provider will continue to send you a result note on any labs that he orders.  With these changes you may see your results before your provider does.   Please allow up to 72 hours for review and response to your results.     -- For referrals placed during the visit, if you have not heard from scheduling within one week, please call the call center at [phone] to get scheduling assistance.  -- For radiology referrals placed during the visit, if you have not heard from scheduling within one week, please call the call center at [phone] to get scheduling assistance.    -- For medication refills, please first contact your pharmacy, who will fax a refill authorization request form to our office.  Weekdays only. Allow up to 2 business days for refills. Please plan ahead.  -- Allow one business week for our office to complete any requested paperwork (FMLA, etc.)     -- Our front desk staff, Vista Deck, or Luisa Hart may be reached at [phone] option 1 for scheduling needs.   -- Herbert Seta, LPN, or Huntley Dec, RN may be contacted at [phone] option 2 for urgent needs. Staff will return your call within 24 business hours.     For Appointments:   -- Please try to arrive early for your appointment time to help facilitate your visit.   -- If you are late to your appointment, we reserve the right to ask you to reschedule or wait until next available time to be seen in fairness to other patients scheduled that day.   -- There are times when we are running behind in clinic. Our goal is to always be on time, however, there are time when unexpected events occur with patients, which may cause a delay. We appreciate your understanding when this occurs.  -- Please stop at the front desk after each visit to make sure we schedule your follow-up visit and print any referrals or labs for you.

## 2022-01-21 ENCOUNTER — Encounter: Admit: 2022-01-21 | Discharge: 2022-01-21 | Payer: BC Managed Care – PPO

## 2022-01-21 ENCOUNTER — Ambulatory Visit: Admit: 2022-01-21 | Discharge: 2022-01-22 | Payer: BC Managed Care – PPO

## 2022-01-21 DIAGNOSIS — G43909 Migraine, unspecified, not intractable, without status migrainosus: Secondary | ICD-10-CM

## 2022-01-21 DIAGNOSIS — Z9221 Personal history of antineoplastic chemotherapy: Secondary | ICD-10-CM

## 2022-01-21 DIAGNOSIS — K219 Gastro-esophageal reflux disease without esophagitis: Secondary | ICD-10-CM

## 2022-01-21 DIAGNOSIS — R112 Nausea with vomiting, unspecified: Secondary | ICD-10-CM

## 2022-01-21 DIAGNOSIS — K929 Disease of digestive system, unspecified: Secondary | ICD-10-CM

## 2022-01-21 DIAGNOSIS — F419 Anxiety disorder, unspecified: Secondary | ICD-10-CM

## 2022-01-21 DIAGNOSIS — Z789 Other specified health status: Secondary | ICD-10-CM

## 2022-01-21 DIAGNOSIS — R1319 Other dysphagia: Secondary | ICD-10-CM

## 2022-01-21 DIAGNOSIS — C50911 Malignant neoplasm of unspecified site of right female breast: Secondary | ICD-10-CM

## 2022-01-21 MED ORDER — ONABOTULINUMTOXINA 100 UNIT IJ SOLR
195 [IU] | Freq: Once | INTRAMUSCULAR | 0 refills | Status: CP
Start: 2022-01-21 — End: ?
  Administered 2022-01-21: 13:00:00 195 [IU] via INTRAMUSCULAR

## 2022-01-21 MED ORDER — TOPIRAMATE 100 MG PO TAB
100 mg | ORAL_TABLET | Freq: Two times a day (BID) | ORAL | 1 refills | Status: AC
Start: 2022-01-21 — End: ?

## 2022-01-21 MED ORDER — EMGALITY PEN 120 MG/ML SC PNIJ
120 mg | SUBCUTANEOUS | 11 refills | Status: AC
Start: 2022-01-21 — End: ?

## 2022-01-21 NOTE — Procedures
Subjective: Debra Fleming 36 y.o. female is here today for onabotulinum toxin A for chronic migraines.        Onabotulinum toxin A injection for chronic migraines.      Currently: Today Debra Fleming reports averaging 1 migraine per month    Vitals:    01/21/22 0803   BP: 112/75   BP Source: Arm, Left Upper   Pulse: 84   SpO2: 99%       Botox Injection Procedure   Informed consent given to the patient to include, but not limited to:   Most common side effects: neck pain, headache, eyelid ptosis, migraine, muscular weakness, musculoskeletal stiffness, injection-site pain, musculoskeletal pain, myalgia, facial paresis, hypertension, and muscle spasms; infection at injections sites, bruising, bleeding  Most serious side effects/risks: anaphylaxis, dysphagia, arrhythmia, myocardial infarction, and in some cases, spontaneous death    Use of onabotulinum toxin A injections for people with chronic migraine. Discussed reasonable outcomes and the common side effects.   The plan is to be using the PREEMPT paradigm [facility] SK, et. al. Cephalalgia, [phone])  with follow the pain method as needed.     Time Out Performed: Identified correct patient name/DOB, injection sites, medication: onabotulinum toxin A, using alcohol preps at sites.   Confirmed: patient, procedure, side, site, safety procedures followed.     Performed by: Sherene Sires, APRN-NP.     Preparation: no contraindications noted to Botox; possible medications prior to procedure: Emla cream 2.5%/2.5% topically prior to procedure; Preparation of site with alcohol wipes  Procedure performed:   Indication: Chronic Migraine Headaches  Medication: Onabotulinum toxin A  2.5 ml/100 units (onabotulinum toxin A 5 units per 0.41mL, 200 units prepared, units not used were wasted following protocol)    Location: PREEMPT protocol Jerrell [facility] SK, and coauthors. Cephalalgia, 2010;30:793)  Muscles/Sites Injected  A - Bilateral Corrugator - 10 units divided in 2 sites  B - Midline Procerus - 5 units in 1 site  C - Bilateral Frontalis - 20 units divided in 4 sites, additional 10 units divided in 2 sites  D - Bilateral Temporalis - 40 units divided in 8 sites  E - Bilateral Occipitalis - 30 units divided in 6 sites, additional 10 units divided in 2 sites  F - Bilateral Cervical Paraspinals - 20 units divided in 4 sites  G - Bilateral Trapezius - 30 units divided in 6 sites, additional 20 units divided in 4 sites  ?  Total Dose - 195 Units divided in 39?sites      Procedure tolerated: well.     Complications: none.    Diagnosis - Chronic Migraine Headaches   Course:  Progressing as expected.    Counseled:  Patient/Family, Regarding diagnosis, Regarding treatment, Regarding medications. If any serious side effects occur, the patient has been instructed to go to the nearest emergency room and call our office.  Follow up: as scheduled - call with any concerns

## 2022-01-21 NOTE — Progress Notes
Today's Date: 01/21/22     The MIDAS (Migraine Disability Assessment) questionnaire was put together to help you measure the impact your headaches have on your life. The information on this questionnaire is also helpful for your provider to determine the level of pain and disability caused by your headaches and to find the best treatment for you.     INSTRUCTIONS    Please answer the following questions about ALL of the headaches you have had over the last 3 months. Select your answer in the box next to each question. Select zero if you did not have the activity in the last 3 months. Please take the completed form to your healthcare professional.   0 1. On how many days in the last 3 months did you miss work or school because of your headaches?  0 2. How many days in the last 3 months was your productivity at work or school reduced by half or more because of your headaches? (Do not include days you counted in question 1 where you missed work or school.)  0 3. On how many days in the last 3 months did you not do household work (such as housework, home repairs and maintenance, shopping, caring for children and relatives) because of your headaches?  5-6 4. How many days in the last 3 months was your productivity in household work reduced by half of more because of your headaches? (Do not include days you counted in question 3 where you did not do household work.)  6-7 5. On how many days in the last 3 months did you miss family, social or leisure activities because of your headaches?  11-13 Total (Questions 1-5)     What your Physician will need to know about your headache:  3 A. On how many days in the last 3 months did you have a headache? (If a headache lasted more than 1 day, count each day.)  6 B. On a scale of 0 - 10, on average how painful were these headaches? (where 0=no pain at all, and 10= pain as bad as it can be.)

## 2022-01-22 DIAGNOSIS — G43709 Chronic migraine without aura, not intractable, without status migrainosus: Secondary | ICD-10-CM

## 2022-03-04 ENCOUNTER — Encounter: Admit: 2022-03-04 | Discharge: 2022-03-04 | Payer: BC Managed Care – PPO

## 2022-03-07 ENCOUNTER — Encounter: Admit: 2022-03-07 | Discharge: 2022-03-07 | Payer: BC Managed Care – PPO

## 2022-03-07 NOTE — Telephone Encounter
PA with clinicals for Emgality submitted on [email or web address].    Information sent to [facility].  Immediate approval received.

## 2022-03-17 ENCOUNTER — Encounter: Admit: 2022-03-17 | Discharge: 2022-03-17 | Payer: BC Managed Care – PPO

## 2022-03-24 ENCOUNTER — Encounter: Admit: 2022-03-24 | Discharge: 2022-03-24 | Payer: BC Managed Care – PPO

## 2022-04-04 ENCOUNTER — Encounter: Admit: 2022-04-04 | Discharge: 2022-04-04 | Payer: BC Managed Care – PPO

## 2022-04-04 ENCOUNTER — Ambulatory Visit: Admit: 2022-04-04 | Discharge: 2022-04-05 | Payer: BC Managed Care – PPO

## 2022-04-04 DIAGNOSIS — G43909 Migraine, unspecified, not intractable, without status migrainosus: Secondary | ICD-10-CM

## 2022-04-04 DIAGNOSIS — C50911 Malignant neoplasm of unspecified site of right female breast: Secondary | ICD-10-CM

## 2022-04-04 DIAGNOSIS — F419 Anxiety disorder, unspecified: Secondary | ICD-10-CM

## 2022-04-04 DIAGNOSIS — G43709 Chronic migraine without aura, not intractable, without status migrainosus: Secondary | ICD-10-CM

## 2022-04-04 DIAGNOSIS — R112 Nausea with vomiting, unspecified: Secondary | ICD-10-CM

## 2022-04-04 DIAGNOSIS — Z9221 Personal history of antineoplastic chemotherapy: Secondary | ICD-10-CM

## 2022-04-04 DIAGNOSIS — K219 Gastro-esophageal reflux disease without esophagitis: Secondary | ICD-10-CM

## 2022-04-04 DIAGNOSIS — R1319 Other dysphagia: Secondary | ICD-10-CM

## 2022-04-04 DIAGNOSIS — Z789 Other specified health status: Secondary | ICD-10-CM

## 2022-04-04 DIAGNOSIS — K929 Disease of digestive system, unspecified: Secondary | ICD-10-CM

## 2022-04-04 MED ORDER — TOPIRAMATE 100 MG PO TAB
100 mg | ORAL_TABLET | Freq: Two times a day (BID) | ORAL | 3 refills | Status: AC
Start: 2022-04-04 — End: ?

## 2022-04-04 MED ORDER — RIMEGEPANT 75 MG PO TBDI
75 mg | ORAL_TABLET | Freq: Every day | ORAL | 11 refills | Status: AC | PRN
Start: 2022-04-04 — End: ?

## 2022-04-04 MED ORDER — RIMEGEPANT 75 MG PO TBDI
75 mg | ORAL_TABLET | Freq: Every day | ORAL | 1 refills | Status: DC | PRN
Start: 2022-04-04 — End: 2022-04-04

## 2022-04-04 NOTE — Progress Notes
All issues documented were discussed and addressed but no physical exam was performed unless allowed by visual confirmation on Zoom. If it was felt that the patient should be evaluated in clinic then they were directed there. Patient verbally consented to visit and understands diagnostic limitations of not having ability to complete full physical examination.       Date of Service: 04/04/2022    CC: Migraines Follow Up    History of Present Illness  Debra Fleming is a 37 year old Female  who is a patient of Dr. Verna Czech who presents tover telehealth today for routine follow up on chronic migraines. Please see below for any updates or changes to current migraine characteristics.     Onset: Childhood but worsened in the last 4 years with chemotherapy  Location: bilateral frontotemporal and into the neck- same  Quality: pressure (in frontal) constant, sharp (with severe migraines), tension (neck/occipital) -same  Severity: Averaging 4-5/10  Average number of headache days per month: Debra Fleming previously reported averaging 1 migraine per month. Today Debra Fleming reports averaging 1-2 migraines per month. She reports ongoing lingering daily sinus pressure. She previously saw ENT and reports normal sinuses.  Duration: 12 hours up to 3 days  Aggravating symptoms: phonophobia, osmophobia, photophobia tingling/numbness in upper brow and moved into cheeks and lips (this has significantly improved since starting Botox and Emgality) -same  Denies: lacrimation/rhinorrhea, positional changes, ringing/roaring of the ears, nausea, vomiting  Triggers: weather changes, stress, same day migraine w/ Botox -same  Aura: denies  Sleep/snoring: She does snore. She takes sleeping medication. She has trouble initiating sleep and staying asleep. Previously on Ambien. PCP switched her over Seroquel. Migraines do not wake her up out of her sleep. Works with Dr. Threasa Heads. At Home Sleep study 08/25/21:  No signs of OSA.  Last Eye Exam: A few months ago (in 2023)- reports normal findings     Current Migraine Medications:   Topiramate 100mg  BID (outside provider)- changed recently from BID to daily due to concerns for impaired sleep. Prescribed for migraines and anxiety.  Botox Q11 weeks- works great for reducing frequency and severity of migraines. Q11 weeks due to wearing off effect  Emgality 120mg  monthly CGRP- denies concerns. 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    Migraines     Other dysphagia     PONV (postoperative nausea and vomiting)      Surgical History:   Procedure Laterality Date    LIPOMA RESECTION  2004    back    BREAST BIOPSY Right 09/11/2014    BILATERAL NIPPLE-SPARING MASTECTOMIES, RIGHT SENTINEL LYMPH NODE BIOPSY Bilateral 10/22/2014    Performed by Cordelia Poche, MD at [facility] OR    INSERTION TISSUE EXPANDER BREAST Bilateral 10/22/2014    Performed by Corliss Skains, MD at [facility] OR    INSERTION VENOUS  ACCESS PORT Left 10/22/2014    Performed by Simonne Martinet, MD at [facility] OR    TUNNELED VENOUS PORT REMOVAL  2017    RECONSTRUCTION BREAST/ BILATERAL TISSUE EXPANDER REMOVAL/ SILICONE IMPLANT PLACEMENT, PORT REMOVAL Bilateral 05/04/2015    Performed by Corliss Skains, MD at [facility] OR    BILATERAL CAPSULOTOMIES Bilateral 05/04/2015    Performed by Corliss Skains, MD at [facility] OR    INSERTION FAT GRAFT BREAST FROM ABDOMEN Bilateral 10/14/2015    Performed by Corliss Skains, MD at [facility] OR    LEFT BREAST MOUND REVISION WITH FAT GRAFTING; RIGHT BREAST RECONSTRUCTION WITH FAT GRAFTING Bilateral 04/01/2016    Performed by Corliss Skains, MD at [facility] OR    Bilateral fat grafting from abdomen and thighs Bilateral 04/01/2016    Performed by Corliss Skains, MD at [facility] OR    BILATERAL BREAST REVISION WITH CASULOTOMY AND IMPLANT EXCHANGE Bilateral 10/12/2016    Performed by Corliss Skains, MD at [facility] OR    LIPOSUCTION TO AXILLARY FOLDS Bilateral 10/12/2016    Performed by Corliss Skains, MD at [facility] OR    TISSUE GRAFT - Bilateral breast fat grafting from abdominal donor site Bilateral 07/26/2017    Performed by Corliss Skains, MD at [facility] OR    PERIPROSTHETIC CAPSULECTOMY BREAST-Left lateral capsulectomy with ADM support Left 07/26/2017    Performed by Corliss Skains, MD at [facility] OR    REVISION RECONSTRUCTED BREAST Right 07/26/2017    Performed by Corliss Skains, MD at [facility] OR    GRAFTING AUTOLOGOUS FAT BY LIPOSUCTION TO BREASTS LEFT 165 CC, RIGHT 168 CC TOTAL 333 CC Bilateral 03/15/2019    Performed by Corliss Skains, MD at [facility] OR    GRAFTING AUTOLOGOUS FAT BY LIPOSUCTION TO BREASTS LEFT 165 CC, RIGHT 168 CC TOTAL 333 CC Bilateral 03/15/2019    Performed by Corliss Skains, MD at [facility] OR    INSERTION SIENTRA [phone] BREAST PROSTHESIS IN RECONSTRUCTION - IMMEDIATE Bilateral 03/15/2019    Performed by Corliss Skains, MD at [facility] OR    GRAFTING AUTOLOGOUS FAT BY LIPOSUCTION TO TRUNK/ BREASTS/ SCALP/ ARMS/ LEGS - 50 CC OR LESS Bilateral 12/09/2019    Performed by Corliss Skains, MD at [facility] OR    GRAFTING AUTOLOGOUS FAT BY LIPOSUCTION TO TRUNK/ BREASTS/ SCALP/ ARMS/ LEGS - EACH ADDITIONAL 50 CC Bilateral 12/09/2019    Performed by Corliss Skains, MD at [facility] OR    ADJACENT TISSUE TRANSFER/ REARRANGEMENT 10 SQ CM OR LESS - TORSO Right 12/09/2019    Performed by Corliss Skains, MD at [facility] OR    BREAST RECONSTRUCTION Bilateral 08/17/2020    BILATERAL PERI-IMPLANT CAPSULECTOMY BREAST - COMPLETE, INCLUDING REMOVAL ALL INTRACAPSULAR CONTENTS Bilateral 08/17/2020    Performed by Corliss Skains, MD at [facility] OR    ABDOMINAL EXCISION EXCESSIVE SKIN/ SUBCUTANEOUS TISSUE - ABDOMEN WITH UMBILICAL TRANSPOSITION AND FASCIAL PLICATION  N/A 08/17/2020    Performed by Corliss Skains, MD at [facility] OR    GRAFTING AUTOLOGOUS FAT BY LIPOSUCTION TO BREASTS/ CHEST - 50 CC OR LESS FROM ABDOMEN, FLANKS, HIPS, THIGHS Bilateral 08/17/2020    Performed by Corliss Skains, MD at [facility] OR    GRAFTING AUTOLOGOUS FAT BY LIPOSUCTION TO BREASTS/ CHEST - FROM ABDOMEN, FLANKS, HIPS, THIGHS - EACH ADDITIONAL 50 CC  08/17/2020    Performed by Corliss Skains, MD at [facility] OR     Family History   Problem  Relation Age of Onset    High Cholesterol Mother     Arthritis-rheumatoid Mother     Arthritis-rheumatoid Maternal Aunt     Cancer-Lung Maternal Grandmother     Arthritis-rheumatoid Maternal Grandmother     Cancer-Lung Maternal Grandfather     Cancer-Uterine Paternal Grandmother     Arthritis-rheumatoid Paternal Grandmother     Cancer-Prostate Paternal Grandfather     Diabetes Paternal Grandfather      Social History     Tobacco Use    Smoking status: Former     Packs/day: 0.25     Years: 4.00     Additional pack years: 0.00     Total pack years: 1.00     Types: Cigarettes     Quit date: 08/10/2005     Years since quitting: 16.6    Smokeless tobacco: Never   Vaping Use    Vaping Use: Never used   Substance Use Topics    Alcohol use: Not Currently     Alcohol/week: 0.0 standard drinks of alcohol     Comment: rarely    Drug use: No          Review of Systems   Eyes:  Positive for photophobia.   Musculoskeletal:  Positive for neck pain.   Neurological:  Positive for headaches.         Objective:          ALPRAZolam (XANAX) 0.5 mg tablet TAKE 1 TABLET BY MOUTH TWICE DAILY as needed for anxiety    baclofen (LIORESAL) 10 mg tablet Take one tablet by mouth twice daily.    gabapentin (NEURONTIN) 300 mg capsule take 3 capsules BY MOUTH at bedtime DAILY galcanezumab-gnlm (EMGALITY PEN) 120 mg/mL subcutaneous PEN Inject 1 mL under the skin every 30 days.    GEMTESA 75 mg tablet Take one tablet by mouth daily.    lisinopriL (ZESTRIL) 20 mg tablet Take one tablet by mouth daily.    ONAbotulinum toxin A (BOTOX) 100 Units/1 mL injection Inject into the muscle as directed. For Migraines.    ondansetron (ZOFRAN ODT) 4 mg rapid dissolve tablet Dissolve one tablet by mouth every 6 hours as needed. Place on tongue to dissolve.  Indications: prevent nausea and vomiting after surgery    QUEtiapine (SEROQUEL) 100 mg tablet Take one tablet by mouth at bedtime daily.    rimegepant (NURTEC ODT) 75 mg rapid dissolve tablet Dissolve one tablet by mouth daily as needed for Migraine symptoms. #8 = 30 days. DO NOT FILL EARLY!!  Indications: a migraine headache    topiramate (TOPAMAX) 100 mg tablet Take one tablet by mouth twice daily. Indications: migraine prevention     There were no vitals filed for this visit.  There is no height or weight on file to calculate BMI.     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 Migraines are currently well controlled with Botox and Emgality. Patient denies concerns during today's visit.     Plan:  Continue  Emgality 120mg  monthly injections for migraine prevention. -- Trial of Emgality discussed. This is a Calcitonin-gene related peptide antagonist to prevent migraines.   -- It should NOT be used in pregnancy or if you are planning to become pregnant, as the risks are NOT known. Stop 5 months prior to conception.  --The main side effects are injection site  reaction, rash, pain at injection site, rarely hives or anaphylactic reaction.   --Set medication out 15-30 minutes before to reduce the burning sensation. You may ice area of injection first, then clean with alcohol wipe. You may use clear Benadryl gel after injection to reduce itching or rash.  --We ask you try this medication for 3-4 months due to trials showing it may take this long to notice meaningful benefit for some patients, but you may have response in as early as 2 weeks.    3. Continue Botox injections every 11 weeks for chronic migraine prevention.    4. Continue Baclofen 10mg  at bedtime for neck tension/spasms. May cause drowsiness/dizziness. Do not combine with alcohol.     5.  Please keep new patient appointment with Dr. Geanie Cooley for ongoing management of chronic migraines.     For Acute Headache:  -- For a more severe headache, take Nurtec 75mg + naproxen 440/500mg  + magnesium oxide 400-500mg  +/- benadryl 25mg  (depending on if you are at work/needing to drive)  -- Please refrain from taking any combination of as needed medications more than 8-10 times per month, as this can lead to medication overuse/rebound headaches.    Total of 15 minutes were spent on the same day of the visit including preparing to see the patient, obtaining   and/or reviewing separately obtained history, counseling and educating the patient/family/caregiver, ordering medications, tests, or   procedures, referring and communication with other health care professionals, documenting clinical   information in the electronic or other health record, independently interpreting results and communicating   results to the patient/family/caregiver, and care coordination.

## 2022-04-04 NOTE — Progress Notes
The MIDAS (Migraine Disability Assessment) questionnaire was put together to help you measure the impact your headaches have on your life. The information on this questionnaire is also helpful for your provider to determine the level of pain and disability caused by your headaches and to find the best treatment for you. This information may also be required by your insurance for authorization for treatments ordered by your provider.      INSTRUCTIONS:     Please answer the following questions about ALL of the headaches you have had over the last 3 months. If necessary, answer the question with your average per month and multiply by 3.  Select zero if you did not have the activity (ex: if you do not work or go to school) in the last 3 months.     0 1. How many days in the last 3 months did you miss work or school because of your headaches?    2 2. How many days in the last 3 months was your productivity at work or school reduced by half or more because of your headaches? (Do not include days you counted in question 1 where you missed work or school.)     5 3. How many days in the last 3 months did you not do household work because of your headaches?    7 4. How many days in the last 3 months was your productivity in household work reduced by half or more because of your headaches? (Do not include days you counted in question 3 where you did not do household work.)    4 5. How many days in the last 3 months did you miss family, social or leisure activities because of your headaches?    18 Total (Questions 1-5)     What your Physician will need to know about your headache:    3-6 A. On how many days in the last 3 months did you have a headache? (If a headache lasted more than 1 day, count each day.)    4-5 B. On a scale of 0 - 10, on average how painful were these headaches? (where 0=no pain at all, and 10= pain as bad as it can be.)

## 2022-04-05 ENCOUNTER — Encounter: Admit: 2022-04-05 | Discharge: 2022-04-05 | Payer: BC Managed Care – PPO

## 2022-04-05 NOTE — Telephone Encounter
PA with clinicals for Nurtec submitted on [email or web address].    Information sent to [employer].

## 2022-04-07 NOTE — Patient Instructions
Do NOT massage or apply pressure on the treated area for 4hrs after treatment since Botox may migrate to areas of undesirable effectiveness.   Do NOT lie down for 4 hours after treatment. This is to avoid the risk of pressure on the treated areas. Also Do NOT lean forward.  Avoid rigorous exercise/activities, extensive heat (eg. sauna, hot tub, tanning) and sun exposure, and alcoholic beverages for the first 24 hours after treatment. This may cause temporary redness, swelling, and/or itching at the injection sites. Feel free to shower and go about most other daily activities.  You may experience a mild headache after Botox. Should this occur, we recommend you avoid aspirin or aspirin containing products. You may opt instead to use acetaminophen, and/or cool compresses. Cold compresses may be used 10 minutes on 10 minutes off to reduce swelling 2-3 times per day during the first 1-2 days if needed.    Note that any bumps or marks will go away in a few hours. If you do develop a bruise it will resolve like any other bruises you may have had in about a week. There is occasionally some mild pain, swelling, itching, or redness at the site of injection similar to most other injections. Redness may last for 1-2 days, rarely longer. You may apply cool compresses or take acetaminophen to reduce swelling or discomfort.  Please call the office with any questions or concerns.        It is patient's responsibility to notify the clinic of any insurance changes, at least 2 weeks prior to any Botox injection appointment, to allow for prior authorization update.      Botox Savings Program:  [employer] will reimburse patients up to $1,000 per treatment  [email or web address] or [phone] Option 4 to register and submit claims.  You will need your EOB with patient responsibility listed.  If your EOB does not include CPT and J-code you will want to write those on the EOB to prevent delayed processing.    CPT code: 98119  J-code:  [id number]     -- If you are having acute (new/sudden onset) or severe/worsening neurologic symptoms, please call 911 or seek care in ED.    -- For scheduling of IMAGING/RADIOLOGY, please call [phone] at your convenience to schedule your studies.-    -- If you have internet access/smart phone please contact me through MyChart. This is our preferred method of contact and the most efficient way to contact your healthcare team. If you do not have internet access/smart phone you can call at [phone].   -- Preferred method of communication is through [employer], if the issue cannot wait until your next scheduled follow up.   -- MyChart may be used for non-emergent communication. Emails are not reviewed after hours or over the weekend/holidays/after 4PM. Staff will reply to your email within 24-48 business hours.     -- Please do not leave multiple voicemails for [place], leaving multiple voicemails delay [place] in getting back to you quicker.    -- If you do not hear from [place] within one week of a lab or imaging study being completed, please call/send my chart email to the office to be sure that we have received the results. This is especially challenging when tests are done outside of the [facility] system, as many times results do not make it back to our office for a variety of reasons. In our office no news is good news does not apply. You should hear from [place] with results  for each test.   lab/imaging results:  Due to the CARES act, results automatically release to MyChart.  Your provider will continue to send you a result note on any labs that he orders.  With these changes you may see your results before your provider does.   Please allow up to 72 hours for review and response to your results.     -- For referrals placed during the visit, if you have not heard from scheduling within one week, please call the call center at [phone] to get scheduling assistance.  -- For radiology referrals placed during the visit, if you have not heard from scheduling within one week, please call the call center at [phone] to get scheduling assistance.    -- For medication refills, please first contact your pharmacy, who will fax a refill authorization request form to our office.  Weekdays only. Allow up to 2 business days for refills. Please plan ahead.  -- Allow one business week for our office to complete any requested paperwork (FMLA, etc.)     -- Our front desk staff, Vista Deck, or Luisa Hart may be reached at [phone] option 1 for scheduling needs.   -- Herbert Seta, RN, or Huntley Dec, RN may be contacted at [phone] option 2 for urgent needs. Staff will return your call within 24 business hours.     For Appointments:   -- Please try to arrive early for your appointment time to help facilitate your visit.   -- If you are late to your appointment, we reserve the right to ask you to reschedule or wait until next available time to be seen in fairness to other patients scheduled that day.   -- There are times when we are running behind in clinic. Our goal is to always be on time, however, there are time when unexpected events occur with patients, which may cause a delay. We appreciate your understanding when this occurs.  -- Please stop at the front desk after each visit to make sure we schedule your follow-up visit and print any referrals or labs for you.

## 2022-04-08 ENCOUNTER — Encounter: Admit: 2022-04-08 | Discharge: 2022-04-08 | Payer: BC Managed Care – PPO

## 2022-04-08 ENCOUNTER — Ambulatory Visit: Admit: 2022-04-08 | Discharge: 2022-04-09 | Payer: BC Managed Care – PPO

## 2022-04-08 DIAGNOSIS — F419 Anxiety disorder, unspecified: Secondary | ICD-10-CM

## 2022-04-08 DIAGNOSIS — R1319 Other dysphagia: Secondary | ICD-10-CM

## 2022-04-08 DIAGNOSIS — K929 Disease of digestive system, unspecified: Secondary | ICD-10-CM

## 2022-04-08 DIAGNOSIS — Z789 Other specified health status: Secondary | ICD-10-CM

## 2022-04-08 DIAGNOSIS — K219 Gastro-esophageal reflux disease without esophagitis: Secondary | ICD-10-CM

## 2022-04-08 DIAGNOSIS — C50911 Malignant neoplasm of unspecified site of right female breast: Secondary | ICD-10-CM

## 2022-04-08 DIAGNOSIS — G43909 Migraine, unspecified, not intractable, without status migrainosus: Secondary | ICD-10-CM

## 2022-04-08 DIAGNOSIS — Z9221 Personal history of antineoplastic chemotherapy: Secondary | ICD-10-CM

## 2022-04-08 DIAGNOSIS — G43709 Chronic migraine without aura, not intractable, without status migrainosus: Secondary | ICD-10-CM

## 2022-04-08 DIAGNOSIS — R112 Nausea with vomiting, unspecified: Secondary | ICD-10-CM

## 2022-04-08 MED ORDER — ONABOTULINUMTOXINA 100 UNIT IJ SOLR
195 [IU] | Freq: Once | INTRAMUSCULAR | 0 refills | Status: CP
Start: 2022-04-08 — End: ?
  Administered 2022-04-08: 18:00:00 195 [IU] via INTRAMUSCULAR

## 2022-04-08 NOTE — Procedures
Subjective: Debra Fleming 37 y.o. female is here today for onabotulinum toxin A for chronic migraines.        Onabotulinum toxin A injection for chronic migraines.      Currently: Today Debra Fleming reports averaging 1 migraine every 2 months.    Vitals:    04/08/22 1123   BP: 95/67   BP Source: Arm, Left Upper   Pulse: 91   SpO2: 99%   PainSc: Two   Height: 167.6 cm (5' 6)       Botox Injection Procedure   Informed consent given to the patient to include, but not limited to:   Most common side effects: neck pain, headache, eyelid ptosis, migraine, muscular weakness, musculoskeletal stiffness, injection-site pain, musculoskeletal pain, myalgia, facial paresis, hypertension, and muscle spasms; infection at injections sites, bruising, bleeding  Most serious side effects/risks: anaphylaxis, dysphagia, arrhythmia, myocardial infarction, and in some cases, spontaneous death    Use of onabotulinum toxin A injections for people with chronic migraine. Discussed reasonable outcomes and the common side effects.   The plan is to be using the PREEMPT paradigm [facility], et. al. Cephalalgia, [phone])  with follow the pain method as needed.     Time Out Performed: Identified correct patient name/DOB, injection sites, medication: onabotulinum toxin A, using alcohol preps at sites.   Confirmed: patient, procedure, side, site, safety procedures followed.     Performed by: Sherene Sires, APRN-NP   Preparation: no contraindications noted to Botox; possible medications prior to procedure: Emla cream 2.5%/2.5% topically prior to procedure; Preparation of site with alcohol wipes  Procedure performed:   Indication: Chronic Migraine Headaches  Medication: Onabotulinum toxin A  2.5 ml/100 units (onabotulinum toxin A 5 units per 0.20mL, 200 units prepared, units not used were wasted following protocol)    Location: PREEMPT protocol Jerrell [facility] SK, and coauthors. Cephalalgia, 2010;30:793)  Muscles/Sites Injected  A - Bilateral Corrugator - 10 units divided in 2 sites  B - Midline Procerus - 5 units in 1 site  C - Bilateral Frontalis - 20 units divided in 4 sites, additional 10 units divided in 2 sites  D - Bilateral Temporalis - 40 units divided in 8 sites  E - Bilateral Occipitalis - 30 units divided in 6 sites, additional 10 units divided in 2 sites  F - Bilateral Cervical Paraspinals - 20 units divided in 4 sites  G - Bilateral Trapezius - 30 units divided in 6 sites, additional 20 units divided in 4 sites     Total Dose - 195 Units divided in 39 sites      Procedure tolerated: well.     Complications: none.    Diagnosis - Chronic Migraine Headaches   Course:  Progressing as expected.    Counseled:  Patient/Family, Regarding diagnosis, Regarding treatment, Regarding medications. If any serious side effects occur, the patient has been instructed to go to the nearest emergency room and call our office.  Follow up: as scheduled - call with any concerns

## 2022-05-23 ENCOUNTER — Encounter: Admit: 2022-05-23 | Discharge: 2022-05-23 | Payer: BC Managed Care – PPO

## 2022-05-26 NOTE — Progress Notes
Subjective:       History of Present Illness  Debra Fleming is a 37 y.o. female presenting to Neurology clinic for follow up regarding chronic migraines last seen by [place], APRN-NP on 04/04/22 currently on baclofen, emgality, topiramate, onabotulinumtoxinA (04/08/22 last injection) and nurtec prn.     In the interim, she reports overall good control in her migraines. She is overall happy with her regimen. She is reporting migraines about once per month with less severe headaches. She is not interested in making any changes today.        HPI:   History of Present Illness  Debra Fleming is a 37 year old Female  who is a patient of Dr. Verna Czech who presents tover telehealth today for routine follow up on chronic migraines. Please see below for any updates or changes to current migraine characteristics.      Onset: Childhood but worsened in the last 4 years with chemotherapy  Location: bilateral frontotemporal and into the neck- same  Quality: pressure (in frontal) constant, sharp (with severe migraines), tension (neck/occipital) -same  Severity: Averaging 4-5/10  Average number of headache days per month: Ms. Stolp previously reported averaging 1 migraine per month. Today Debra Fleming reports averaging 1-2 migraines per month. She reports ongoing lingering daily sinus pressure. She previously saw ENT and reports normal sinuses.  Duration: 12 hours up to 3 days  Aggravating symptoms: phonophobia, osmophobia, photophobia tingling/numbness in upper brow and moved into cheeks and lips (this has significantly improved since starting Botox and Emgality) -same  Denies: lacrimation/rhinorrhea, positional changes, ringing/roaring of the ears, nausea, vomiting  Triggers: weather changes, stress, same day migraine w/ Botox -same  Aura: denies  Sleep/snoring: She does snore. She takes sleeping medication. She has trouble initiating sleep and staying asleep. Previously on Ambien. PCP switched her over Seroquel. Migraines do not wake her up out of her sleep. Works with Dr. Threasa Heads. At Home Sleep study 08/25/21:  No signs of OSA.  Last Eye Exam: A few months ago (in 2023)- reports normal findings      Current Migraine Medications:   Topiramate 100mg  BID (outside provider)- changed recently from BID to daily due to concerns for impaired sleep. Prescribed for migraines and anxiety.  Botox Q11 weeks- works great for reducing frequency and severity of migraines. Q11 weeks due to wearing off effect  Emgality 120mg  monthly CGRP- denies concerns. Works well  Baclofen10mg  HS- helps with tension in the neck     Previous tried/failed medications:   Sumatriptan - Effective but mildly bothersome side effects  Amitriptyline - not effective  Metoprolol - not effective   Rizatriptan- side effects  Venlafaxine- no benefit    Relevant PMHx:  Stage 2A breast cancer s/p mastectomy   Migraines   Insomnia   MDD    Prior Workup:   [place] [facility] Head 04/24/20: normal   __________________________________________________________________________________    Medical History:   Diagnosis Date    Acid reflux     chemo induced    Anxiety disorder     Gastrointestinal disorder     History of chemotherapy 2017    Limb alert care status 6/16    right limb alert    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    Migraines     Other dysphagia     PONV (postoperative nausea and vomiting)      Surgical History:   Procedure Laterality Date    LIPOMA RESECTION  2004  back    BREAST BIOPSY Right 09/11/2014    BILATERAL NIPPLE-SPARING MASTECTOMIES, RIGHT SENTINEL LYMPH NODE BIOPSY Bilateral 10/22/2014    Performed by Cordelia Poche, MD at [facility] OR    INSERTION TISSUE EXPANDER BREAST Bilateral 10/22/2014    Performed by Corliss Skains, MD at [facility] OR    INSERTION VENOUS ACCESS PORT Left 10/22/2014    Performed by Simonne Martinet, MD at [facility] OR    TUNNELED VENOUS PORT REMOVAL  2017    RECONSTRUCTION BREAST/ BILATERAL TISSUE EXPANDER REMOVAL/ SILICONE IMPLANT PLACEMENT, PORT REMOVAL Bilateral 05/04/2015    Performed by Corliss Skains, MD at [facility] OR    BILATERAL CAPSULOTOMIES Bilateral 05/04/2015    Performed by Corliss Skains, MD at [facility] OR    INSERTION FAT GRAFT BREAST FROM ABDOMEN Bilateral 10/14/2015    Performed by Corliss Skains, MD at [facility] OR    LEFT BREAST MOUND REVISION WITH FAT GRAFTING; RIGHT BREAST RECONSTRUCTION WITH FAT GRAFTING Bilateral 04/01/2016    Performed by Corliss Skains, MD at [facility] OR    Bilateral fat grafting from abdomen and thighs Bilateral 04/01/2016    Performed by Corliss Skains, MD at [facility] OR    BILATERAL BREAST REVISION WITH CASULOTOMY AND IMPLANT EXCHANGE Bilateral 10/12/2016    Performed by Corliss Skains, MD at [facility] OR    LIPOSUCTION TO AXILLARY FOLDS Bilateral 10/12/2016    Performed by Corliss Skains, MD at [facility] OR    TISSUE GRAFT - Bilateral breast fat grafting from abdominal donor site Bilateral 07/26/2017    Performed by Corliss Skains, MD at [facility] OR    PERIPROSTHETIC CAPSULECTOMY BREAST-Left lateral capsulectomy with ADM support Left 07/26/2017    Performed by Corliss Skains, MD at [facility] OR    REVISION RECONSTRUCTED BREAST Right 07/26/2017    Performed by Corliss Skains, MD at [facility] OR    GRAFTING AUTOLOGOUS FAT BY LIPOSUCTION TO BREASTS LEFT 165 CC, RIGHT 168 CC TOTAL 333 CC Bilateral 03/15/2019    Performed by Corliss Skains, MD at [facility] OR    GRAFTING AUTOLOGOUS FAT BY LIPOSUCTION TO BREASTS LEFT 165 CC, RIGHT 168 CC TOTAL 333 CC Bilateral 03/15/2019    Performed by Corliss Skains, MD at [facility] OR    INSERTION SIENTRA [phone] BREAST PROSTHESIS IN RECONSTRUCTION - IMMEDIATE Bilateral 03/15/2019    Performed by Corliss Skains, MD at [facility] OR    GRAFTING AUTOLOGOUS FAT BY LIPOSUCTION TO TRUNK/ BREASTS/ SCALP/ ARMS/ LEGS - 50 CC OR LESS Bilateral 12/09/2019    Performed by Corliss Skains, MD at [facility] OR    GRAFTING AUTOLOGOUS FAT BY LIPOSUCTION TO TRUNK/ BREASTS/ SCALP/ ARMS/ LEGS - EACH ADDITIONAL 50 CC Bilateral 12/09/2019    Performed by Corliss Skains, MD at [facility] OR    ADJACENT TISSUE TRANSFER/ REARRANGEMENT 10 SQ CM OR LESS - TORSO Right 12/09/2019    Performed by Corliss Skains, MD at [facility] OR    BREAST RECONSTRUCTION Bilateral 08/17/2020    BILATERAL PERI-IMPLANT CAPSULECTOMY BREAST - COMPLETE, INCLUDING REMOVAL ALL INTRACAPSULAR CONTENTS Bilateral 08/17/2020    Performed by Corliss Skains, MD at [facility] OR    ABDOMINAL EXCISION EXCESSIVE SKIN/ SUBCUTANEOUS TISSUE - ABDOMEN WITH UMBILICAL TRANSPOSITION AND FASCIAL PLICATION  N/A 08/17/2020    Performed by Corliss Skains, MD at [facility] OR    GRAFTING AUTOLOGOUS FAT BY LIPOSUCTION TO BREASTS/ CHEST - 50 CC OR LESS FROM ABDOMEN, FLANKS, HIPS, THIGHS Bilateral  08/17/2020    Performed by Corliss Skains, MD at [facility] OR    GRAFTING AUTOLOGOUS FAT BY LIPOSUCTION TO BREASTS/ CHEST - FROM ABDOMEN, FLANKS, HIPS, THIGHS - EACH ADDITIONAL 50 CC  08/17/2020    Performed by Corliss Skains, MD at [facility] OR     Social History     Tobacco Use    Smoking status: Former     Current packs/day: 0.00     Average packs/day: 0.3 packs/day for 4.0 years (1.0 ttl pk-yrs)     Types: Cigarettes     Start date: 08/10/2001     Quit date: 08/10/2005     Years since quitting: 16.8    Smokeless tobacco: Never   Vaping Use    Vaping status: Never Used   Substance Use Topics    Alcohol use: Not Currently     Alcohol/week: 0.0 standard drinks of alcohol     Comment: rarely    Drug use: No     Family History   Problem Relation Age of Onset    High Cholesterol Mother     Arthritis-rheumatoid Mother     Arthritis-rheumatoid Maternal Aunt     Cancer-Lung Maternal Grandmother     Arthritis-rheumatoid Maternal Grandmother     Cancer-Lung Maternal Grandfather     Cancer-Uterine Paternal Grandmother     Arthritis-rheumatoid Paternal Grandmother     Cancer-Prostate Paternal Grandfather     Diabetes Paternal Grandfather      Allergies   Allergen Reactions Chlorhexidine RASH     CHG dressing    Penicillin G HEADACHE and RASH     childhood allergy    Scopolamine NAUSEA AND VOMITING       Review of Systems  ROS negative unless otherwise specified in HPI    Objective:          ALPRAZolam (XANAX) 0.5 mg tablet TAKE 1 TABLET BY MOUTH TWICE DAILY as needed for anxiety    baclofen (LIORESAL) 10 mg tablet Take one tablet by mouth twice daily.    gabapentin (NEURONTIN) 300 mg capsule take 3 capsules BY MOUTH at bedtime DAILY    galcanezumab-gnlm (EMGALITY PEN) 120 mg/mL subcutaneous PEN Inject 1 mL under the skin every 30 days.    GEMTESA 75 mg tablet Take one tablet by mouth daily.    lisinopriL (ZESTRIL) 20 mg tablet Take one tablet by mouth daily.    mirtazapine (REMERON) 15 mg tablet Take one tablet by mouth at bedtime daily.    ONAbotulinum toxin A (BOTOX) 100 Units/1 mL injection Inject into the muscle as directed. For Migraines.    ondansetron (ZOFRAN ODT) 4 mg rapid dissolve tablet Dissolve one tablet by mouth every 6 hours as needed. Place on tongue to dissolve.  Indications: prevent nausea and vomiting after surgery    QUEtiapine (SEROQUEL) 100 mg tablet Take one tablet by mouth at bedtime daily.    rimegepant (NURTEC ODT) 75 mg rapid dissolve tablet Dissolve one tablet by mouth daily as needed for Migraine symptoms. #8 = 30 days. DO NOT FILL EARLY!!  Indications: a migraine headache    topiramate (TOPAMAX) 100 mg tablet Take one tablet by mouth twice daily. Indications: migraine prevention    WEGOVY 2.4 mg/0.75 mL injector PEN INJECT 2.4MG  ONCE WEEKLY     There were no vitals filed for this visit.  There is no height or weight on file to calculate BMI.     Neurological examination (limited by telehealth encounter):   Mental status: Alert,  oriented to time, place and person  Speech: Normal fluency, production, comprehension and articulation     Cranial Nerves:   CN II III, IV, VI: extraocular movements intact  CN VII: no obvious facial droop, eye closure and eyebrow elevation symmetric/synchronous  CN VIII: hearing grossly intact to conversation  CN IX and X: unable to perform   CN XI: symmetric shoulder raise   CN XII: tongue midline    Motor: Lifts all extremities to gravity without drift     Sensory: unable to perform  Reflexes: unable to perform     Coordination: normal finger to nose  Gait: normal-based      ASSESSMENT/PLAN:  CLINICAL SUMMARY      Fujie Sherrel Reder 37 y.o. female is here today for follow up of chronic migraines and headaches.     Current migraines are under good control reporting about 1x per month. I refilled her medications as she was happy with her current regimen.     Impression:  1. Chronic migraine without aura, not intractable, without status migrainosus    2. Neck pain      RECOMMENDATIONS  1. Continue onabotulinumtoxinA for migraine prevention   2. Continue emgality 120 mg Q30 days for migraine prevention   3. Continue nurtec prn for migraine abortive   4. Continue baclofen 10 mg BID for neck pains     MANAGEMENT AND PLAN  During the visit I discussed my impression, recommended diagnostic studies, prognosis, risks and benefits of management, instructions for management, and importance of compliance.  After a discussion, the patient agrees with the plan.     Total Time Today was 20 minutes in the following activities: Preparing to see the patient, Obtaining and/or reviewing separately obtained history, Performing a medically appropriate examination and/or evaluation, Counseling and educating the patient/family/caregiver, and Ordering medications, tests, or procedures    FOLLOWUP PLAN  Return in about 1 year (around 05/30/2023).  The patient is instructed to contact me if there are any concerns with the agreed plan.    Barb Merino, MD  Clinical Assistant Professor   Department of Neurology  [facility]

## 2022-05-30 ENCOUNTER — Encounter: Admit: 2022-05-30 | Discharge: 2022-05-30 | Payer: BC Managed Care – PPO

## 2022-05-30 ENCOUNTER — Ambulatory Visit: Admit: 2022-05-30 | Discharge: 2022-05-31 | Payer: BC Managed Care – PPO

## 2022-05-30 DIAGNOSIS — M542 Cervicalgia: Secondary | ICD-10-CM

## 2022-05-30 DIAGNOSIS — Z9221 Personal history of antineoplastic chemotherapy: Secondary | ICD-10-CM

## 2022-05-30 DIAGNOSIS — R1319 Other dysphagia: Secondary | ICD-10-CM

## 2022-05-30 DIAGNOSIS — Z789 Other specified health status: Secondary | ICD-10-CM

## 2022-05-30 DIAGNOSIS — K219 Gastro-esophageal reflux disease without esophagitis: Secondary | ICD-10-CM

## 2022-05-30 DIAGNOSIS — K929 Disease of digestive system, unspecified: Secondary | ICD-10-CM

## 2022-05-30 DIAGNOSIS — G43909 Migraine, unspecified, not intractable, without status migrainosus: Secondary | ICD-10-CM

## 2022-05-30 DIAGNOSIS — C50911 Malignant neoplasm of unspecified site of right female breast: Secondary | ICD-10-CM

## 2022-05-30 DIAGNOSIS — R112 Nausea with vomiting, unspecified: Secondary | ICD-10-CM

## 2022-05-30 DIAGNOSIS — F419 Anxiety disorder, unspecified: Secondary | ICD-10-CM

## 2022-05-30 MED ORDER — RIMEGEPANT 75 MG PO TBDI
75 mg | ORAL_TABLET | Freq: Every day | ORAL | 11 refills | Status: AC | PRN
Start: 2022-05-30 — End: ?

## 2022-05-30 MED ORDER — TOPIRAMATE 100 MG PO TAB
100 mg | ORAL_TABLET | Freq: Two times a day (BID) | ORAL | 3 refills | Status: AC
Start: 2022-05-30 — End: ?

## 2022-05-30 MED ORDER — EMGALITY PEN 120 MG/ML SC PNIJ
120 mg | SUBCUTANEOUS | 11 refills | Status: AC
Start: 2022-05-30 — End: ?

## 2022-05-30 MED ORDER — BACLOFEN 10 MG PO TAB
10 mg | ORAL_TABLET | Freq: Two times a day (BID) | ORAL | 11 refills | Status: AC
Start: 2022-05-30 — End: ?

## 2022-05-30 NOTE — Progress Notes
The MIDAS (Migraine Disability Assessment) questionnaire was put together to help you measure the impact your headaches have on your life. The information on this questionnaire is also helpful for your provider to determine the level of pain and disability caused by your headaches and to find the best treatment for you. This information may also be required by your insurance for authorization for treatments ordered by your provider.      INSTRUCTIONS:     Please answer the following questions about ALL of the headaches you have had over the last 3 months. If necessary, answer the question with your average per month and multiply by 3.  Select zero if you did not have the activity (ex: if you do not work or go to school) in the last 3 months.     0 1. How many days in the last 3 months did you miss work or school because of your headaches?    2 2. How many days in the last 3 months was your productivity at work or school reduced by half or more because of your headaches? (Do not include days you counted in question 1 where you missed work or school.)     8-10 3. How many days in the last 3 months did you not do household work because of your headaches?    10-15 4. How many days in the last 3 months was your productivity in household work reduced by half or more because of your headaches? (Do not include days you counted in question 3 where you did not do household work.)    1-2 5. How many days in the last 3 months did you miss family, social or leisure activities because of your headaches?    29 Total (Questions 1-5)     What your Physician will need to know about your headache:    25 headache days, 1 migraine day A. On how many days in the last 3 months did you have a headache? (If a headache lasted more than 1 day, count each day.)    2 B. On a scale of 0 - 10, on average how painful were these headaches? (where 0=no pain at all, and 10= pain as bad as it can be.)

## 2022-05-31 DIAGNOSIS — G43709 Chronic migraine without aura, not intractable, without status migrainosus: Secondary | ICD-10-CM

## 2022-06-20 ENCOUNTER — Ambulatory Visit: Admit: 2022-06-20 | Discharge: 2022-06-21 | Payer: BC Managed Care – PPO

## 2022-06-20 ENCOUNTER — Encounter: Admit: 2022-06-20 | Discharge: 2022-06-20 | Payer: BC Managed Care – PPO

## 2022-06-20 DIAGNOSIS — K929 Disease of digestive system, unspecified: Secondary | ICD-10-CM

## 2022-06-20 DIAGNOSIS — R112 Nausea with vomiting, unspecified: Secondary | ICD-10-CM

## 2022-06-20 DIAGNOSIS — Z9221 Personal history of antineoplastic chemotherapy: Secondary | ICD-10-CM

## 2022-06-20 DIAGNOSIS — F419 Anxiety disorder, unspecified: Secondary | ICD-10-CM

## 2022-06-20 DIAGNOSIS — Z789 Other specified health status: Secondary | ICD-10-CM

## 2022-06-20 DIAGNOSIS — G43909 Migraine, unspecified, not intractable, without status migrainosus: Secondary | ICD-10-CM

## 2022-06-20 DIAGNOSIS — R1319 Other dysphagia: Secondary | ICD-10-CM

## 2022-06-20 DIAGNOSIS — G43709 Chronic migraine without aura, not intractable, without status migrainosus: Secondary | ICD-10-CM

## 2022-06-20 DIAGNOSIS — K219 Gastro-esophageal reflux disease without esophagitis: Secondary | ICD-10-CM

## 2022-06-20 DIAGNOSIS — C50911 Malignant neoplasm of unspecified site of right female breast: Secondary | ICD-10-CM

## 2022-06-20 MED ORDER — ONABOTULINUMTOXINA 100 UNIT IJ SOLR
195 [IU] | Freq: Once | INTRAMUSCULAR | 0 refills | Status: CP
Start: 2022-06-20 — End: ?
  Administered 2022-06-20: 14:00:00 195 [IU] via INTRAMUSCULAR

## 2022-06-20 NOTE — Procedures
Subjective: Debra Fleming 37 y.o. female is here today for onabotulinum toxin A for chronic migraines.        Onabotulinum toxin A injection for chronic migraines.    Botox #13 every 11 weeks    Currently: Debra Fleming reports averaging 1 migraine per month      Vitals:    06/20/22 0903   BP: 115/81   BP Source: Arm, Left Upper   Pulse: 83   SpO2: 99%   PainSc: Two   Height: 167.6 cm (5' 6)       Botox Injection Procedure   Informed consent given to the patient to include, but not limited to:   Most common side effects: neck pain, headache, eyelid ptosis, migraine, muscular weakness, musculoskeletal stiffness, injection-site pain, musculoskeletal pain, myalgia, facial paresis, hypertension, and muscle spasms; infection at injections sites, bruising, bleeding  Most serious side effects/risks: anaphylaxis, dysphagia, arrhythmia, myocardial infarction, and in some cases, spontaneous death    Use of onabotulinum toxin A injections for people with chronic migraine. Discussed reasonable outcomes and the common side effects.   The plan is to be using the PREEMPT paradigm Debra Fleming Fleming, et. al. Cephalalgia, [phone])  with follow the pain method as needed.     Time Out Performed: Identified correct patient name/DOB, injection sites, medication: onabotulinum toxin A, using alcohol preps at sites.   Confirmed: patient, procedure, side, site, safety procedures followed.     Performed by: Sherene Sires, APRN-NP   Preparation: no contraindications noted to Botox; possible medications prior to procedure: Emla cream 2.5%/2.5% topically prior to procedure; Preparation of site with alcohol wipes  Procedure performed:   Indication: Chronic Migraine Headaches  Medication: Onabotulinum toxin A  2.5 ml/100 units (onabotulinum toxin A 5 units per 0.65mL, 200 units prepared, units not used were wasted following protocol)    Location: PREEMPT protocol Debra Fleming, and coauthors. Cephalalgia, 2010;30:793)  Muscles/Sites Injected  A - Bilateral Corrugator - 10 units divided in 2 sites  B - Midline Procerus - 5 units in 1 site  C - Bilateral Frontalis - 20 units divided in 4 sites, additional 10 units divided in 2 sites  D - Bilateral Temporalis - 40 units divided in 8 sites  E - Bilateral Occipitalis - 30 units divided in 6 sites, additional 10 units divided in 2 sites  F - Bilateral Cervical Paraspinals - 20 units divided in 4 sites  G - Bilateral Trapezius - 30 units divided in 6 sites, additional 20 units divided in 4 sites     Total Dose - 195 Units divided in 39 sites      Procedure tolerated: well.     Complications: none.    Diagnosis - Chronic Migraine Headaches   Course:  Progressing as expected.    Counseled:  Patient/Family, Regarding diagnosis, Regarding treatment, Regarding medications. If any serious side effects occur, the patient has been instructed to go to the nearest emergency room and call our office.  Follow up: as scheduled - call with any concerns

## 2022-08-30 ENCOUNTER — Encounter: Admit: 2022-08-30 | Discharge: 2022-08-30 | Payer: BC Managed Care – PPO

## 2022-09-06 ENCOUNTER — Encounter: Admit: 2022-09-06 | Discharge: 2022-09-06 | Payer: BC Managed Care – PPO

## 2022-09-09 ENCOUNTER — Ambulatory Visit: Admit: 2022-09-09 | Discharge: 2022-09-10 | Payer: BC Managed Care – PPO

## 2022-09-09 ENCOUNTER — Encounter: Admit: 2022-09-09 | Discharge: 2022-09-09 | Payer: BC Managed Care – PPO

## 2022-09-09 DIAGNOSIS — Z789 Other specified health status: Secondary | ICD-10-CM

## 2022-09-09 DIAGNOSIS — C50911 Malignant neoplasm of unspecified site of right female breast: Secondary | ICD-10-CM

## 2022-09-09 DIAGNOSIS — G43909 Migraine, unspecified, not intractable, without status migrainosus: Secondary | ICD-10-CM

## 2022-09-09 DIAGNOSIS — K219 Gastro-esophageal reflux disease without esophagitis: Secondary | ICD-10-CM

## 2022-09-09 DIAGNOSIS — K929 Disease of digestive system, unspecified: Secondary | ICD-10-CM

## 2022-09-09 DIAGNOSIS — F419 Anxiety disorder, unspecified: Secondary | ICD-10-CM

## 2022-09-09 DIAGNOSIS — Z9221 Personal history of antineoplastic chemotherapy: Secondary | ICD-10-CM

## 2022-09-09 DIAGNOSIS — R112 Nausea with vomiting, unspecified: Secondary | ICD-10-CM

## 2022-09-09 DIAGNOSIS — R1319 Other dysphagia: Secondary | ICD-10-CM

## 2022-09-09 MED ORDER — ONABOTULINUMTOXINA 100 UNIT IJ SOLR
195 [IU] | Freq: Once | INTRAMUSCULAR | 0 refills | Status: CP
Start: 2022-09-09 — End: ?
  Administered 2022-09-09: 15:00:00 195 [IU] via INTRAMUSCULAR

## 2022-09-09 NOTE — Procedures
Subjective: Debra Fleming 37 y.o. female is here today for onabotulinum toxin A for chronic migraines.        Onabotulinum toxin A injection for chronic migraines.    Botox #14 every 11 weeks  Currently: 1 migraine every 2 months    Patient report s>50% reduction of migraine, >7 less migraines per month and >100 less severe headache hours per month with Botox injections. Use of Botox has lead to reduction in use of acute treatment.      Vitals:    09/09/22 0937   BP: 104/69   BP Source: Arm, Left Upper   Pulse: 86       Botox Injection Procedure   Informed consent given to the patient to include, but not limited to:   Most common side effects: neck pain, headache, eyelid ptosis, migraine, muscular weakness, musculoskeletal stiffness, injection-site pain, musculoskeletal pain, myalgia, facial paresis, hypertension, and muscle spasms; infection at injections sites, bruising, bleeding  Most serious side effects/risks: anaphylaxis, dysphagia, arrhythmia, myocardial infarction, and in some cases, spontaneous death    Use of onabotulinum toxin A injections for people with chronic migraine. Discussed reasonable outcomes and the common side effects.   The plan is to be using the PREEMPT paradigm [facility] SK, et. al. Cephalalgia, [phone])  with follow the pain method as needed.     Time Out Performed: Identified correct patient name/DOB, injection sites, medication: onabotulinum toxin A, using alcohol preps at sites.   Confirmed: patient, procedure, side, site, safety procedures followed.       Preparation: no contraindications noted to Botox; possible medications prior to procedure: Emla cream 2.5%/2.5% topically prior to procedure; Preparation of site with alcohol wipes  Procedure performed: [place] Keyonta Madrid, APRN-NP  Indication: Chronic Migraine Headaches  Medication: Onabotulinum toxin A  2.5 ml/100 units (onabotulinum toxin A 5 units per 0.19mL, 200 units prepared, units not used were wasted following protocol)    Location: PREEMPT protocol Jerrell Belfast SK, and coauthors. Cephalalgia, 2010;30:793)  Muscles/Sites Injected  A - Bilateral Corrugator - 10 units divided in 2 sites  B - Midline Procerus - 5 units in 1 site  C - Bilateral Frontalis - 20 units divided in 4 sites, additional 10 units divided in 2 sites  D - Bilateral Temporalis - 40 units divided in 8 sites  E - Bilateral Occipitalis - 30 units divided in 6 sites, additional 10 units divided in 2 sites  F - Bilateral Cervical Paraspinals - 20 units divided in 4 sites  G - Bilateral Trapezius - 30 units divided in 6 sites, additional 20 units divided in 4 sites     Total Dose - 195 Units divided in 39 sites         Procedure tolerated: well.     Complications: none.    Diagnosis - Chronic Migraine Headaches   Course:  Progressing as expected.    Counseled:  Patient/Family, Regarding diagnosis, Regarding treatment, Regarding medications. If any serious side effects occur, the patient has been instructed to go to the nearest emergency room and call our office.  Follow up: as scheduled - call with any concerns

## 2022-09-09 NOTE — Patient Instructions
Do NOT massage or apply pressure on the treated area for 4hrs after treatment since Botox may migrate to areas of undesirable effectiveness.   Do NOT lie down for 4 hours after treatment. This is to avoid the risk of pressure on the treated areas. Also Do NOT lean forward.  Avoid rigorous exercise/activities, extensive heat (eg. sauna, hot tub, tanning) and sun exposure, and alcoholic beverages for the first 24 hours after treatment. This may cause temporary redness, swelling, and/or itching at the injection sites. Feel free to shower and go about most other daily activities.  You may experience a mild headache after Botox. Should this occur, we recommend you avoid aspirin or aspirin containing products. You may opt instead to use acetaminophen, and/or cool compresses. Cold compresses may be used 10 minutes on 10 minutes off to reduce swelling 2-3 times per day during the first 1-2 days if needed.    Note that any bumps or marks will go away in a few hours. If you do develop a bruise it will resolve like any other bruises you may have had in about a week. There is occasionally some mild pain, swelling, itching, or redness at the site of injection similar to most other injections. Redness may last for 1-2 days, rarely longer. You may apply cool compresses or take acetaminophen to reduce swelling or discomfort.  Please call the office with any questions or concerns.        It is patient's responsibility to notify the clinic of any insurance changes, at least 2 weeks prior to any Botox injection appointment, to allow for prior authorization update.      -- If you are having acute (new/sudden onset) or severe/worsening neurologic symptoms, please call 911 or seek care in ED.    -- For scheduling of IMAGING/RADIOLOGY, please call [phone] at your convenience to schedule your studies.    -- If you have internet access/smart phone please contact me through MyChart. This is our preferred method of contact and the most efficient way to contact your healthcare team. If you do not have internet access/smart phone you can call at [phone].   -- Preferred method of communication is through MyChart message, if the issue cannot wait until your next scheduled follow up.   -- MyChart may be used for non-emergent communication. Emails are not reviewed after hours or over the weekend/holidays/after 4PM. Staff will reply to your email within 24-48 business hours.     -- If you do not hear from us within one week of a lab or imaging study being completed, please call/send my chart email to the office to be sure that we have received the results. This is especially challenging when tests are done outside of the [facility] system, as many times results do not make it back to our office for a variety of reasons. In our office no news is good news does not apply. You should hear from us with results for each test.  [facility] lab/imaging results:  Due to the [employer] act, results automatically release to MyChart.  Your provider will continue to send you a result note on any labs that he orders.  With these changes you may see your results before your provider does.   Please allow up to 72 hours for review and response to your results.     -- For referrals placed during the visit, if you have not heard from scheduling within one week, please call the call center at [phone] to get scheduling   assistance.  -- For radiology referrals placed during the visit, if you have not heard from scheduling within one week, please call the call center at [phone] to get scheduling assistance.    -- For medication refills, please first contact your pharmacy, who will fax a refill authorization request form to our office.  Weekdays only. Allow up to 2 business days for refills. Please plan ahead.  -- Allow one business week for our office to complete any requested paperwork (FMLA, Prior Authorization, etc.)     -- Our front desk staff, Brielle, Michelle, or Patrick may be reached at [phone] option 1 for scheduling needs.   -- Heather, RN, Erin, RN, or Aryam Zhan, RN may be contacted at [phone] option 2 for urgent needs. Staff will return your call within 24 business hours.     For Appointments:   -- Please try to arrive early for your appointment time to help facilitate your visit.   -- If you are late to your appointment, we reserve the right to ask you to reschedule or wait until next available time to be seen in fairness to other patients scheduled that day.   -- There are times when we are running behind in clinic. Our goal is to always be on time, however, there are time when unexpected events occur with patients, which may cause a delay. We appreciate your understanding when this occurs.  -- Please stop at the front desk after each visit to make sure we schedule your follow-up visit and print any referrals or labs for you.

## 2022-09-10 DIAGNOSIS — G43709 Chronic migraine without aura, not intractable, without status migrainosus: Secondary | ICD-10-CM

## 2022-10-03 ENCOUNTER — Encounter: Admit: 2022-10-03 | Discharge: 2022-10-03 | Payer: BC Managed Care – PPO

## 2022-10-03 NOTE — Telephone Encounter
Received PA request for [employer]. Completed on [email or web address] and attached clinicals. Your information has been submitted to [employer]. To check for an updated outcome later, reopen this PA request from your dashboard.    If [employer] has not responded to your request within 24 hours, contact [employer] at [phone]. If you think there may be a problem with your PA request, use our live chat feature at the bottom right. PA Case ID: [id number]

## 2022-11-04 ENCOUNTER — Encounter: Admit: 2022-11-04 | Discharge: 2022-11-04 | Payer: BC Managed Care – PPO

## 2022-11-27 ENCOUNTER — Encounter: Admit: 2022-11-27 | Discharge: 2022-11-27 | Payer: BC Managed Care – PPO

## 2022-12-12 ENCOUNTER — Encounter: Admit: 2022-12-12 | Discharge: 2022-12-12 | Payer: BC Managed Care – PPO

## 2022-12-12 ENCOUNTER — Ambulatory Visit: Admit: 2022-12-12 | Discharge: 2022-12-13 | Payer: BC Managed Care – PPO

## 2022-12-12 DIAGNOSIS — G43709 Chronic migraine without aura, not intractable, without status migrainosus: Secondary | ICD-10-CM

## 2022-12-12 DIAGNOSIS — R112 Nausea with vomiting, unspecified: Secondary | ICD-10-CM

## 2022-12-12 DIAGNOSIS — K219 Gastro-esophageal reflux disease without esophagitis: Secondary | ICD-10-CM

## 2022-12-12 DIAGNOSIS — K929 Disease of digestive system, unspecified: Secondary | ICD-10-CM

## 2022-12-12 DIAGNOSIS — Z9221 Personal history of antineoplastic chemotherapy: Secondary | ICD-10-CM

## 2022-12-12 DIAGNOSIS — Z789 Other specified health status: Secondary | ICD-10-CM

## 2022-12-12 DIAGNOSIS — C50911 Malignant neoplasm of unspecified site of right female breast: Secondary | ICD-10-CM

## 2022-12-12 DIAGNOSIS — G43909 Migraine, unspecified, not intractable, without status migrainosus: Secondary | ICD-10-CM

## 2022-12-12 DIAGNOSIS — R1319 Other dysphagia: Secondary | ICD-10-CM

## 2022-12-12 DIAGNOSIS — F419 Anxiety disorder, unspecified: Secondary | ICD-10-CM

## 2022-12-12 MED ORDER — ONABOTULINUMTOXINA 100 UNIT IJ SOLR
195 [IU] | Freq: Once | INTRAMUSCULAR | 0 refills | Status: CP
Start: 2022-12-12 — End: ?
  Administered 2022-12-12: 16:00:00 195 [IU] via INTRAMUSCULAR

## 2022-12-12 NOTE — Procedures
Subjective: Debra Fleming 37 y.o. female is here today for onabotulinum toxin A for chronic migraines.        Onabotulinum toxin A injection for chronic migraines.      Currently: Debra Fleming is currently reporting 1 migraine per month. She has had a >50% decrease in migraines.       Vitals:    12/12/22 1101   BP: 110/76   BP Source: Arm, Left Upper   Pulse: 88   SpO2: 98%   PainSc: One   Height: 167.6 cm (5' 6)       Botox Injection Procedure   Informed consent given to the patient to include, but not limited to:   Most common side effects: neck pain, headache, eyelid ptosis, migraine, muscular weakness, musculoskeletal stiffness, injection-site pain, musculoskeletal pain, myalgia, facial paresis, hypertension, and muscle spasms; infection at injections sites, bruising, bleeding  Most serious side effects/risks: anaphylaxis, dysphagia, arrhythmia, myocardial infarction, and in some cases, spontaneous death    Use of onabotulinum toxin A injections for people with chronic migraine. Discussed reasonable outcomes and the common side effects.   The plan is to be using the PREEMPT paradigm [facility] SK, et. al. Cephalalgia, [phone])  with follow the pain method as needed.     Time Out Performed: Identified correct patient name/DOB, injection sites, medication: onabotulinum toxin A, using alcohol preps at sites.   Confirmed: patient, procedure, side, site, safety procedures followed.       Preparation: no contraindications noted to Botox; possible medications prior to procedure: Emla cream 2.5%/2.5% topically prior to procedure; Preparation of site with alcohol wipes  Procedure performed by: Mickle Plumb Jermia Rigsby, APRN-NP  Indication: Chronic Migraine Headaches  Medication: Onabotulinum toxin A  2.5 ml/100 units (onabotulinum toxin A 5 units per 0.82mL, 200 units prepared, units not used were wasted following protocol)    Location: PREEMPT protocol [facility] SK, and coauthors. Cephalalgia, 2010;30:793)  Muscles/Sites Injected  A - Bilateral Corrugator - 10 units divided in 2 sites  B - Midline Procerus - 5 units in 1 site  C - Bilateral Frontalis - 20 units divided in 4 sites, additional 10 units divided in 2 sites  D - Bilateral Temporalis - 40 units divided in 8 sites  E - Bilateral Occipitalis - 30 units divided in 6 sites, additional 10 units divided in 2 sites  F - Bilateral Cervical Paraspinals - 20 units divided in 4 sites  G - Bilateral Trapezius - 30 units divided in 6 sites, additional 20 units divided in 4 sites     Total Dose - 195 Units divided in 39 sites      Procedure tolerated: well.     Complications: none.    Diagnosis - Chronic Migraine Headaches   Course:  Progressing as expected.    Counseled:  Patient/Family, Regarding diagnosis, Regarding treatment, Regarding medications. If any serious side effects occur, the patient has been instructed to go to the nearest emergency room and call our office.  Follow up: as scheduled - call with any concerns

## 2022-12-14 ENCOUNTER — Encounter: Admit: 2022-12-14 | Discharge: 2022-12-14 | Payer: BC Managed Care – PPO

## 2022-12-14 DIAGNOSIS — K929 Disease of digestive system, unspecified: Secondary | ICD-10-CM

## 2022-12-14 DIAGNOSIS — Z9221 Personal history of antineoplastic chemotherapy: Secondary | ICD-10-CM

## 2022-12-14 DIAGNOSIS — G709 Myoneural disorder, unspecified: Secondary | ICD-10-CM

## 2022-12-14 DIAGNOSIS — Z789 Other specified health status: Secondary | ICD-10-CM

## 2022-12-14 DIAGNOSIS — C50911 Malignant neoplasm of unspecified site of right female breast: Secondary | ICD-10-CM

## 2022-12-14 DIAGNOSIS — C50411 Malignant neoplasm of upper-outer quadrant of right female breast: Secondary | ICD-10-CM

## 2022-12-14 DIAGNOSIS — R1319 Other dysphagia: Secondary | ICD-10-CM

## 2022-12-14 DIAGNOSIS — F419 Anxiety disorder, unspecified: Secondary | ICD-10-CM

## 2022-12-14 DIAGNOSIS — K219 Gastro-esophageal reflux disease without esophagitis: Secondary | ICD-10-CM

## 2022-12-14 DIAGNOSIS — R112 Nausea with vomiting, unspecified: Secondary | ICD-10-CM

## 2022-12-14 DIAGNOSIS — G43909 Migraine, unspecified, not intractable, without status migrainosus: Secondary | ICD-10-CM

## 2022-12-14 NOTE — Patient Instructions
Team Members:  Dr. Priyanka Sharma, MD  Jaimie Heldstab, APRN  Jaice Digioia, RN   , RN    Contact information:   Office phone: [phone]  Office fax: [phone]  After hours phone: [phone]    Address:  [address], [address]  [place], [place] [address]

## 2023-02-10 ENCOUNTER — Encounter: Admit: 2023-02-10 | Discharge: 2023-02-10 | Payer: BC Managed Care – PPO

## 2023-03-02 ENCOUNTER — Encounter: Admit: 2023-03-02 | Discharge: 2023-03-02 | Payer: BC Managed Care – PPO

## 2023-03-02 ENCOUNTER — Ambulatory Visit: Admit: 2023-03-02 | Discharge: 2023-03-03 | Payer: BC Managed Care – PPO

## 2023-03-02 MED ORDER — ONABOTULINUMTOXINA 100 UNIT IJ SOLR
195 [IU] | Freq: Once | INTRAMUSCULAR | 0 refills | Status: CP | PRN
Start: 2023-03-02 — End: ?

## 2023-03-02 MED ORDER — ONDANSETRON 4 MG PO TBDI
4 mg | ORAL_TABLET | ORAL | 0 refills | 8.00000 days | Status: AC | PRN
Start: 2023-03-02 — End: ?

## 2023-03-02 NOTE — Patient Instructions
 Do NOT massage or apply pressure on the treated area for 4hrs after treatment since Botox may migrate to areas of undesirable effectiveness.   Do NOT lie down for 4 hours after treatment. This is to avoid the risk of pressure on the treated areas. Also Do NOT lean forward.  Avoid rigorous exercise/activities, extensive heat (eg. sauna, hot tub, tanning) and sun exposure, and alcoholic beverages for the first 24 hours after treatment. This may cause temporary redness, swelling, and/or itching at the injection sites. Feel free to shower and go about most other daily activities.  You may experience a mild headache after Botox. Should this occur, we recommend you avoid aspirin or aspirin containing products. You may opt instead to use acetaminophen, and/or cool compresses. Cold compresses may be used 10 minutes on 10 minutes off to reduce swelling 2-3 times per day during the first 1-2 days if needed.    Note that any bumps or marks will go away in a few hours. If you do develop a bruise it will resolve like any other bruises you may have had in about a week. There is occasionally some mild pain, swelling, itching, or redness at the site of injection similar to most other injections. Redness may last for 1-2 days, rarely longer. You may apply cool compresses or take acetaminophen to reduce swelling or discomfort.  Please call the office with any questions or concerns.        It is patient's responsibility to notify the clinic of any insurance changes, at least 2 weeks prior to any Botox injection appointment, to allow for prior authorization update.     Botox Savings Program:  [employer] will reimburse patients up to $1000 per treatment  [email or web address] or [phone] Option 4 to register and submit claims.    You will need your EOB with patient responsibility listed.    If your EOB does not include CPT and J-code you will want to write those on the EOB to prevent delayed processing:   CPT code: 29518  J-code:  [phone]    Preferred method of communication is through MyChart message, if the issue cannot wait until your next scheduled follow up.   MyChart may be used for non-emergent communication. Emails are not reviewed after hours or over the weekend/holidays/after 4PM. Staff will reply to your email within 24-48 business hours.     If you are having acute (new/sudden onset) or severe/worsening neurologic symptoms, please call 911 or seek care in ED.    For scheduling of IMAGING/RADIOLOGY, please call [phone] at your convenience to schedule your studies.  For referrals placed during the visit, if you have not heard from scheduling within one week, please call the call center at [phone] to get scheduling assistance.  For refills on medications, please first contact your pharmacy, who will fax a refill authorization request form to our office.  Weekdays only. Allow up to 2 business days for refills. Please plan ahead, as refills will not be filled after hours or on weekends.    Our front desk staff, Genice Rouge, or Marcelino Duster may be reached at [phone] option 1 for scheduling needs.   My Nurse may be contacted at [phone] option 2 for urgent needs. Staff will return your call within 24 business hours.     For Appointments:   Please try to arrive early for your appointment time to help facilitate your visit. 15 minutes early is recommended.   If you are late to your appointment, we reserve the  right to ask you to reschedule or wait until next available time to be seen in fairness to other patients scheduled that day.   There are times when we are running behind in clinic. Our goal is to always be on time, however, there are time when unexpected events occur with patients, which may cause a delay. We appreciate your understanding when this occurs.

## 2023-03-02 NOTE — Progress Notes
Chemodenervation Muscle Migraine    Date/Time: 03/02/2023 9:20 AM    Performed by: Leslee Home, APRN-NP  Authorized by: Leslee Home, APRN-NP  Consent obtained: written  Consent given by: patient  Procedural risks discussed:  Allergic reaction, bleeding, nerve damage, headache, damage to surrounding structures, infection, pain, vasovagal reaction, weakness and visual impairment  Risks and benefits: risks and benefits discussed  Preparation: Patient was prepped and draped in the usual sterile fashion.    Anesthesia:  Local anesthesia used: no    Procedure:  Laterality: bilateral  Charges: 64615 - PR CHEMODERVATE FACIAL/TRIGEM/CERV MUSC MIGRAINE  Medications administered: 195 Units ONAbotulinumtoxinA 100 Units/1 mL  Total Units Used: 195  Total Units Wasted: 5  Patient tolerance: patient tolerated the procedure well with no immediate complications  Comments: Currently: Averaging 1 migraine per month    Preparation: no contraindications noted to Botox; possible medications prior to procedure: Emla cream 2.5%/2.5% topically prior to procedure; Preparation of site with alcohol wipes or betadine  Muscles/Sites Injected  A - Bilateral Corrugator - 10 units divided in 2 sites  B - Midline Procerus - 5 units in 1 site  C - Bilateral Frontalis - 20 units divided in 4 sites, additional 10 units divided in 2 sites  D - Bilateral Temporalis - 40 units divided in 8 sites  E - Bilateral Occipitalis - 30 units divided in 6 sites, additional 10 units divided in 2 sites  F - Bilateral Cervical Paraspinals - 20 units divided in 4 sites  G - Bilateral Trapezius - 30 units divided in 6 sites, additional 20 units divided in 4 sites     Total Dose - 195 Units divided in 39 sites       The plan is to be using the PREEMPT paradigm ([facility] SK, et. al. Cephalalgia, 2010;30:793)  with follow the pain method as needed.   Time Out Performed: Identified correct patient name/DOB, injection sites, medication: onabotulinum toxin A, using alcohol preps at sites.   Confirmed: patient, procedure, side, site, safety procedures followed.

## 2023-03-03 DIAGNOSIS — G43709 Chronic migraine without aura, not intractable, without status migrainosus: Secondary | ICD-10-CM

## 2023-03-07 ENCOUNTER — Encounter: Admit: 2023-03-07 | Discharge: 2023-03-07 | Payer: BC Managed Care – PPO

## 2023-03-08 ENCOUNTER — Encounter: Admit: 2023-03-08 | Discharge: 2023-03-08 | Payer: BC Managed Care – PPO

## 2023-03-23 ENCOUNTER — Encounter: Admit: 2023-03-23 | Discharge: 2023-03-23 | Payer: BC Managed Care – PPO

## 2023-05-04 ENCOUNTER — Encounter: Admit: 2023-05-04 | Discharge: 2023-05-04 | Payer: BC Managed Care – PPO

## 2023-05-10 ENCOUNTER — Encounter: Admit: 2023-05-10 | Discharge: 2023-05-10 | Payer: BC Managed Care – PPO

## 2023-05-10 DIAGNOSIS — M542 Cervicalgia: Secondary | ICD-10-CM

## 2023-05-10 DIAGNOSIS — G43709 Chronic migraine without aura, not intractable, without status migrainosus: Secondary | ICD-10-CM

## 2023-05-10 MED ORDER — BACLOFEN 10 MG PO TAB
10 mg | ORAL_TABLET | Freq: Two times a day (BID) | ORAL | 11 refills | Status: AC
Start: 2023-05-10 — End: ?

## 2023-05-18 ENCOUNTER — Ambulatory Visit: Admit: 2023-05-18 | Discharge: 2023-05-19 | Payer: BC Managed Care – PPO

## 2023-05-18 ENCOUNTER — Encounter: Admit: 2023-05-18 | Discharge: 2023-05-18 | Payer: BC Managed Care – PPO

## 2023-05-18 MED ORDER — RIMEGEPANT 75 MG PO TBDI
75 mg | ORAL_TABLET | ORAL | 11 refills | Status: AC
Start: 2023-05-18 — End: ?

## 2023-05-18 MED ORDER — EMGALITY PEN 120 MG/ML SC PNIJ
240 mg | SUBCUTANEOUS | 0 refills | Status: AC
Start: 2023-05-18 — End: ?

## 2023-05-18 MED ORDER — EMGALITY PEN 120 MG/ML SC PNIJ
120 mg | SUBCUTANEOUS | 3 refills | Status: AC
Start: 2023-05-18 — End: ?

## 2023-05-18 MED ORDER — ONABOTULINUMTOXINA 100 UNIT IJ SOLR
195 [IU] | Freq: Once | INTRAMUSCULAR | 0 refills | Status: CP | PRN
Start: 2023-05-18 — End: ?

## 2023-05-18 NOTE — Patient Instructions
 Do NOT massage or apply pressure on the treated area for 4hrs after treatment since Botox may migrate to areas of undesirable effectiveness.   Do NOT lie down for 4 hours after treatment. This is to avoid the risk of pressure on the treated areas. Also Do NOT lean forward.  Avoid rigorous exercise/activities, extensive heat (eg. sauna, hot tub, tanning) and sun exposure, and alcoholic beverages for the first 24 hours after treatment. This may cause temporary redness, swelling, and/or itching at the injection sites. Feel free to shower and go about most other daily activities.  You may experience a mild headache after Botox. Should this occur, we recommend you avoid aspirin or aspirin containing products. You may opt instead to use acetaminophen, and/or cool compresses. Cold compresses may be used 10 minutes on 10 minutes off to reduce swelling 2-3 times per day during the first 1-2 days if needed.    Note that any bumps or marks will go away in a few hours. If you do develop a bruise it will resolve like any other bruises you may have had in about a week. There is occasionally some mild pain, swelling, itching, or redness at the site of injection similar to most other injections. Redness may last for 1-2 days, rarely longer. You may apply cool compresses or take acetaminophen to reduce swelling or discomfort.  Please call the office with any questions or concerns.        It is patient's responsibility to notify the clinic of any insurance changes, at least 2 weeks prior to any Botox injection appointment, to allow for prior authorization update.     Botox Savings Program:  [employer] will reimburse patients up to $1000 per treatment  [email or web address] or [phone] Option 4 to register and submit claims.    You will need your EOB with patient responsibility listed.    If your EOB does not include CPT and J-code you will want to write those on the EOB to prevent delayed processing:   CPT code: 84696  J-code:  [phone]    Preferred method of communication is through MyChart message, if the issue cannot wait until your next scheduled follow up.   MyChart may be used for non-emergent communication. Emails are not reviewed after hours or over the weekend/holidays/after 4PM. Staff will reply to your email within 24-48 business hours.     If you are having acute (new/sudden onset) or severe/worsening neurologic symptoms, please call 911 or seek care in ED.    For scheduling of IMAGING/RADIOLOGY, please call [phone] at your convenience to schedule your studies.  For referrals placed during the visit, if you have not heard from scheduling within one week, please call the call center at [phone] to get scheduling assistance.  For refills on medications, please first contact your pharmacy, who will fax a refill authorization request form to our office.  Weekdays only. Allow up to 2 business days for refills. Please plan ahead, as refills will not be filled after hours or on weekends.    Our front desk staff, Genice Rouge, or Marcelino Duster may be reached at [phone] option 1 for scheduling needs.   My Nurse may be contacted at [phone] option 2 for urgent needs. Staff will return your call within 24 business hours.     For Appointments:   Please try to arrive early for your appointment time to help facilitate your visit. 15 minutes early is recommended.   If you are late to your appointment, we reserve the  right to ask you to reschedule or wait until next available time to be seen in fairness to other patients scheduled that day.   There are times when we are running behind in clinic. Our goal is to always be on time, however, there are time when unexpected events occur with patients, which may cause a delay. We appreciate your understanding when this occurs.

## 2023-05-19 ENCOUNTER — Encounter: Admit: 2023-05-19 | Discharge: 2023-05-19 | Payer: BC Managed Care – PPO

## 2023-05-19 DIAGNOSIS — G43709 Chronic migraine without aura, not intractable, without status migrainosus: Secondary | ICD-10-CM

## 2023-06-01 NOTE — Progress Notes
 Subjective:       History of Present Illness  Debra Fleming is a 38 y.o. female presenting to Neurology clinic for follow up regarding chronic migraines last seen currently on baclofen, emgality, topiramate, onabotulinumtoxinA (04/08/22 last injection) and nurtec prn.     In the interim, emgality was denied by insurance. Nurtec Q48 hours prescribed. This was ineffective and emgality was re-prescribed and approved in 04/2023. She has since restarted this last week however she reports continued daily headaches.        Clinical summary at last visit 05/30/22:   Current migraines are under good control reporting about 1x per month. I refilled her medications as she was happy with her current regimen.     HPI:     Debra Fleming is a 38 year old Female  who is a patient of Dr. Verna Czech who presents tover telehealth today for routine follow up on chronic migraines. Please see below for any updates or changes to current migraine characteristics.      Onset: Childhood but worsened in the last 4 years with chemotherapy  Location: bilateral frontotemporal and into the neck- same  Quality: pressure (in frontal) constant, sharp (with severe migraines), tension (neck/occipital) -same  Severity: Averaging 4-5/10  Average number of headache days per month: Debra Fleming previously reported averaging 1 migraine per month. Today Debra Fleming reports averaging 1-2 migraines per month. She reports ongoing lingering daily sinus pressure. She previously saw ENT and reports normal sinuses.  Duration: 12 hours up to 3 days  Aggravating symptoms: phonophobia, osmophobia, photophobia tingling/numbness in upper brow and moved into cheeks and lips (this has significantly improved since starting Botox and Emgality) -same  Denies: lacrimation/rhinorrhea, positional changes, ringing/roaring of the ears, nausea, vomiting  Triggers: weather changes, stress, same day migraine w/ Botox -same  Aura: denies  Sleep/snoring: She does snore. She takes sleeping medication. She has trouble initiating sleep and staying asleep. Previously on Ambien. PCP switched her over Seroquel. Migraines do not wake her up out of her sleep. Works with Dr. Threasa Heads. At Home Sleep study 08/25/21:  No signs of OSA.  Last Eye Exam: A few months ago (in 2023)- reports normal findings      Current Migraine Medications:   Topiramate 100mg  BID (outside provider)- changed recently from BID to daily due to concerns for impaired sleep. Prescribed for migraines and anxiety.  Botox Q11 weeks- works great for reducing frequency and severity of migraines. Q11 weeks due to wearing off effect  Emgality 120mg  monthly CGRP- denies concerns. Works well  Baclofen10mg  HS- helps with tension in the neck     Previous tried/failed medications:   Sumatriptan - Effective but mildly bothersome side effects  Amitriptyline - not effective  Metoprolol - not effective   Rizatriptan- side effects  Venlafaxine- no benefit    Relevant PMHx:  Stage 2A breast cancer s/p mastectomy   Migraines   Insomnia   MDD    Prior Workup:   [place] MRI Head 04/24/20: normal   __________________________________________________________________________________    Past Medical History:    Acid reflux    Allergy    Anxiety    Anxiety disorder    Gastrointestinal disorder    History of chemotherapy    Limb alert care status    Malignant neoplasm of right breast (CMS-HCC)    Migraines    Neuromuscular disorder (CMS-HCC)    Other dysphagia    PONV (postoperative nausea and vomiting)     Surgical History:   Procedure  Laterality Date    LIPOMA RESECTION  2004    back    BREAST BIOPSY Right 09/11/2014    BILATERAL NIPPLE-SPARING MASTECTOMIES, RIGHT SENTINEL LYMPH NODE BIOPSY Bilateral 10/22/2014    Performed by Cordelia Poche, MD at [facility] OR    INSERTION TISSUE EXPANDER BREAST Bilateral 10/22/2014    Performed by Corliss Skains, MD at [facility] [place]    INSERTION VENOUS ACCESS PORT Left 10/22/2014    Performed by Simonne Martinet, MD at [facility] OR    TUNNELED VENOUS PORT REMOVAL  2017    RECONSTRUCTION BREAST/ BILATERAL TISSUE EXPANDER REMOVAL/ SILICONE IMPLANT PLACEMENT, PORT REMOVAL Bilateral 05/04/2015    Performed by Corliss Skains, MD at [facility] OR    BILATERAL CAPSULOTOMIES Bilateral 05/04/2015    Performed by Corliss Skains, MD at [facility] OR    INSERTION FAT GRAFT BREAST FROM ABDOMEN Bilateral 10/14/2015    Performed by Corliss Skains, MD at [facility] OR    LEFT BREAST MOUND REVISION WITH FAT GRAFTING; RIGHT BREAST RECONSTRUCTION WITH FAT GRAFTING Bilateral 04/01/2016    Performed by Corliss Skains, MD at [facility] OR    Bilateral fat grafting from abdomen and thighs Bilateral 04/01/2016    Performed by Corliss Skains, MD at [facility] OR    BILATERAL BREAST REVISION WITH CASULOTOMY AND IMPLANT EXCHANGE Bilateral 10/12/2016    Performed by Corliss Skains, MD at [facility] OR    LIPOSUCTION TO AXILLARY FOLDS Bilateral 10/12/2016    Performed by Corliss Skains, MD at [facility] OR    TISSUE GRAFT - Bilateral breast fat grafting from abdominal donor site Bilateral 07/26/2017    Performed by Corliss Skains, MD at [facility] OR    PERIPROSTHETIC CAPSULECTOMY BREAST-Left lateral capsulectomy with ADM support Left 07/26/2017    Performed by Corliss Skains, MD at [facility] OR    REVISION RECONSTRUCTED BREAST Right 07/26/2017    Performed by Corliss Skains, MD at [facility] OR    GRAFTING AUTOLOGOUS FAT BY LIPOSUCTION TO BREASTS LEFT 165 CC, RIGHT 168 CC TOTAL 333 CC Bilateral 03/15/2019    Performed by Corliss Skains, MD at [facility] OR    GRAFTING AUTOLOGOUS FAT BY LIPOSUCTION TO BREASTS LEFT 165 CC, RIGHT 168 CC TOTAL 333 CC Bilateral 03/15/2019    Performed by Corliss Skains, MD at [facility] OR    INSERTION SIENTRA [phone] BREAST PROSTHESIS IN RECONSTRUCTION - IMMEDIATE Bilateral 03/15/2019    Performed by Corliss Skains, MD at [facility] OR    GRAFTING AUTOLOGOUS FAT BY LIPOSUCTION TO TRUNK/ BREASTS/ SCALP/ ARMS/ LEGS - 50 CC OR LESS Bilateral 12/09/2019    Performed by Corliss Skains, MD at [facility] OR    GRAFTING AUTOLOGOUS FAT BY LIPOSUCTION TO TRUNK/ BREASTS/ SCALP/ ARMS/ LEGS - EACH ADDITIONAL 50 CC Bilateral 12/09/2019    Performed by Corliss Skains, MD at [facility] OR    ADJACENT TISSUE TRANSFER/ REARRANGEMENT 10 SQ CM OR LESS - TORSO Right 12/09/2019    Performed by Corliss Skains, MD at [facility] OR    BREAST RECONSTRUCTION Bilateral 08/17/2020    BILATERAL PERI-IMPLANT CAPSULECTOMY BREAST - COMPLETE, INCLUDING REMOVAL ALL INTRACAPSULAR CONTENTS Bilateral 08/17/2020    Performed by Corliss Skains, MD at [facility] OR    ABDOMINAL EXCISION EXCESSIVE SKIN/ SUBCUTANEOUS TISSUE - ABDOMEN WITH UMBILICAL TRANSPOSITION AND FASCIAL PLICATION  N/A 08/17/2020    Performed by Corliss Skains, MD at [facility] OR    GRAFTING AUTOLOGOUS FAT BY LIPOSUCTION TO BREASTS/  CHEST - 50 CC OR LESS FROM ABDOMEN, FLANKS, HIPS, THIGHS Bilateral 08/17/2020    Performed by Corliss Skains, MD at [facility] OR    GRAFTING AUTOLOGOUS FAT BY LIPOSUCTION TO BREASTS/ CHEST - FROM ABDOMEN, FLANKS, HIPS, THIGHS - EACH ADDITIONAL 50 CC  08/17/2020    Performed by Loney Laurence, Pearletha Furl, MD at [facility] OR    CHEMO CART      COSMETIC SURGERY      HX CHOLECYSTECTOMY      HX HYSTERECTOMY      HX HYSTERECTOMY      LYMPH NODE BIOPSY      TUNNELED VENOUS PORT PLACEMENT       Social History     Tobacco Use    Smoking status: Former     Current packs/day: 0.00     Average packs/day: 0.3 packs/day for 4.0 years (1.0 ttl pk-yrs)     Types: Cigarettes     Start date: 08/10/2001     Quit date: 08/10/2005     Years since quitting: 17.8    Smokeless tobacco: Never   Vaping Use    Vaping status: Never Used   Substance Use Topics    Alcohol use: Not Currently     Comment: rarely    Drug use: No     Family History   Problem Relation Name Age of Onset    High Cholesterol Mother Marcelino Duster     Arthritis-rheumatoid Mother Marcelino Duster     Arthritis-rheumatoid Maternal Aunt Anne?s     Cancer-Lung Maternal Grandmother v     Arthritis-rheumatoid Maternal Grandmother v     Cancer-Lung Maternal Grandfather Dutch     Cancer-Uterine Paternal Grandmother Marisue Ivan     Arthritis-rheumatoid Paternal Grandmother Marisue Ivan     Cancer-Prostate Paternal Addison Bailey     Diabetes Paternal Grandfather Rayna Sexton      Allergies   Allergen Reactions    Adhesive RASH and UNKNOWN    Chlorhexidine RASH     CHG dressing    Penicillin G HEADACHE and RASH     childhood allergy    Scopolamine NAUSEA AND VOMITING       Review of Systems  ROS negative unless otherwise specified in HPI    Objective:          ALPRAZolam (XANAX) 0.5 mg tablet TAKE 1 TABLET BY MOUTH TWICE DAILY as needed for anxiety    baclofen (LIORESAL) 10 mg tablet Take one tablet by mouth twice daily.    doxycycline monohydrate (MONODOX) 100 mg capsule     gabapentin (NEURONTIN) 300 mg capsule take 3 capsules BY MOUTH at bedtime DAILY    galcanezumab-gnlm (EMGALITY PEN) 120 mg/mL subcutaneous PEN Inject 1 mL under the skin every 30 days. Inject 1 mL under the skin every 30 days.    minoxidiL (LONITEN) 2.5 mg tablet take 1/4 tablet BY MOUTH once daily    mirtazapine (REMERON) 15 mg tablet Take one tablet by mouth at bedtime daily.    ONAbotulinum toxin A (BOTOX) 100 Units/1 mL injection Inject into the muscle as directed. For Migraines.    ondansetron (ZOFRAN ODT) 4 mg rapid dissolve tablet Dissolve one tablet by mouth every 6 hours as needed. Place on tongue to dissolve.    QUEtiapine (SEROQUEL) 100 mg tablet Take one tablet by mouth at bedtime daily.    rimegepant (NURTEC ODT) 75 mg rapid dissolve tablet Dissolve one tablet by mouth daily as needed for Migraine symptoms. Indications: a migraine headache    topiramate (TOPAMAX)  100 mg tablet Take one tablet by mouth twice daily. Indications: migraine prevention    WEGOVY 2.4 mg/0.75 mL injector PEN INJECT 2.4MG  ONCE WEEKLY     There were no vitals filed for this visit.  There is no height or weight on file to calculate BMI.     Neurological examination (limited by telehealth encounter):   Mental status: Alert, oriented to time, place and person  Speech: Normal fluency, production, comprehension and articulation     Cranial Nerves:   CN II III, IV, VI: extraocular movements intact  CN VII: no obvious facial droop, eye closure and eyebrow elevation symmetric/synchronous  CN VIII: hearing grossly intact to conversation  CN IX and X: unable to perform   CN XI: symmetric shoulder raise   CN XII: tongue midline    Motor: Lifts all extremities to gravity without drift     Sensory: unable to perform  Reflexes: unable to perform     Coordination: normal finger to nose  Gait: normal-based      ASSESSMENT/PLAN:  CLINICAL SUMMARY      Debra Fleming 38 y.o. female is here today for follow up of chronic migraines and headaches.     She reports interval worsening in her migraines following the cessation of her emgality. Fortunately, this was able to be restarted the past month with her migraines improving. She is otherwise happy with her current regimen. No other changes were made. Refills provided.     Impression:  1. Chronic migraine without aura, not intractable, without status migrainosus      RECOMMENDATIONS  1. Continue onabotulinumtoxinA for migraine prevention   2. Continue emgality 120 mg Q30 days for migraine prevention   3. Continue topiramate 100 mg twice daily for migraine prevention   4. Continue nurtec prn for migraine abortive   5. Continue baclofen 10 mg BID for neck pains     MANAGEMENT AND PLAN  During the visit I discussed my impression, recommended diagnostic studies, prognosis, risks and benefits of management, instructions for management, and importance of compliance.  After a discussion, the patient agrees with the plan.     Total Time Today was 20 minutes in the following activities: Preparing to see the patient, Obtaining and/or reviewing separately obtained history, Performing a medically appropriate examination and/or evaluation, Counseling and educating the patient/family/caregiver, and Ordering medications, tests, or procedures    FOLLOWUP PLAN  No follow-ups on file.  The patient is instructed to contact me if there are any concerns with the agreed plan.    Barb Merino, MD  Clinical Assistant Professor   Department of Neurology  [facility]

## 2023-06-05 ENCOUNTER — Ambulatory Visit: Admit: 2023-06-05 | Discharge: 2023-06-06 | Payer: BC Managed Care – PPO

## 2023-06-05 DIAGNOSIS — G43709 Chronic migraine without aura, not intractable, without status migrainosus: Secondary | ICD-10-CM

## 2023-06-05 MED ORDER — EMGALITY PEN 120 MG/ML SC PNIJ
120 mg | SUBCUTANEOUS | 5 refills | Status: AC
Start: 2023-06-05 — End: ?

## 2023-06-05 MED ORDER — RIMEGEPANT 75 MG PO TBDI
75 mg | ORAL_TABLET | Freq: Every day | ORAL | 11 refills | Status: AC | PRN
Start: 2023-06-05 — End: ?

## 2023-06-05 MED ORDER — TOPIRAMATE 100 MG PO TAB
100 mg | ORAL_TABLET | Freq: Two times a day (BID) | ORAL | 3 refills | Status: AC
Start: 2023-06-05 — End: ?

## 2023-08-10 ENCOUNTER — Encounter: Admit: 2023-08-10 | Discharge: 2023-08-10 | Payer: BLUE CROSS/BLUE SHIELD

## 2023-08-10 ENCOUNTER — Ambulatory Visit: Admit: 2023-08-10 | Discharge: 2023-08-11 | Payer: BLUE CROSS/BLUE SHIELD

## 2023-08-10 MED ORDER — ONABOTULINUMTOXINA 100 UNIT IJ SOLR
195 [IU] | Freq: Once | INTRAMUSCULAR | 0 refills | Status: CP | PRN
Start: 2023-08-10 — End: ?

## 2023-08-10 NOTE — Patient Instructions
 Do NOT massage or apply pressure on the treated area for 4hrs after treatment since Botox may migrate to areas of undesirable effectiveness.   Do NOT lie down for 4 hours after treatment. This is to avoid the risk of pressure on the treated areas. Also Do NOT lean forward.  Avoid rigorous exercise/activities, extensive heat (eg. sauna, hot tub, tanning) and sun exposure, and alcoholic beverages for the first 24 hours after treatment. This may cause temporary redness, swelling, and/or itching at the injection sites. Feel free to shower and go about most other daily activities.  You may experience a mild headache after Botox. Should this occur, we recommend you avoid aspirin or aspirin containing products. You may opt instead to use acetaminophen, and/or cool compresses. Cold compresses may be used 10 minutes on 10 minutes off to reduce swelling 2-3 times per day during the first 1-2 days if needed.    Note that any bumps or marks will go away in a few hours. If you do develop a bruise it will resolve like any other bruises you may have had in about a week. There is occasionally some mild pain, swelling, itching, or redness at the site of injection similar to most other injections. Redness may last for 1-2 days, rarely longer. You may apply cool compresses or take acetaminophen to reduce swelling or discomfort.  Please call the office with any questions or concerns.        It is patient's responsibility to notify the clinic of any insurance changes, at least 2 weeks prior to any Botox injection appointment, to allow for prior authorization update.     Botox Savings Program:  [employer] will reimburse patients up to $1000 per treatment  [email or web address] or [phone] Option 4 to register and submit claims.    You will need your EOB with patient responsibility listed.    If your EOB does not include CPT and J-code you will want to write those on the EOB to prevent delayed processing:   CPT code: 84696  J-code:  [phone]    Preferred method of communication is through MyChart message, if the issue cannot wait until your next scheduled follow up.   MyChart may be used for non-emergent communication. Emails are not reviewed after hours or over the weekend/holidays/after 4PM. Staff will reply to your email within 24-48 business hours.     If you are having acute (new/sudden onset) or severe/worsening neurologic symptoms, please call 911 or seek care in ED.    For scheduling of IMAGING/RADIOLOGY, please call [phone] at your convenience to schedule your studies.  For referrals placed during the visit, if you have not heard from scheduling within one week, please call the call center at [phone] to get scheduling assistance.  For refills on medications, please first contact your pharmacy, who will fax a refill authorization request form to our office.  Weekdays only. Allow up to 2 business days for refills. Please plan ahead, as refills will not be filled after hours or on weekends.    Our front desk staff, Genice Rouge, or Marcelino Duster may be reached at [phone] option 1 for scheduling needs.   My Nurse may be contacted at [phone] option 2 for urgent needs. Staff will return your call within 24 business hours.     For Appointments:   Please try to arrive early for your appointment time to help facilitate your visit. 15 minutes early is recommended.   If you are late to your appointment, we reserve the  right to ask you to reschedule or wait until next available time to be seen in fairness to other patients scheduled that day.   There are times when we are running behind in clinic. Our goal is to always be on time, however, there are time when unexpected events occur with patients, which may cause a delay. We appreciate your understanding when this occurs.

## 2023-08-10 NOTE — Progress Notes
 Procedures

## 2023-08-10 NOTE — Progress Notes
 [facility] SP[facility] CHEMODENERVATION MUSCLE MIGRAINE    Date/Time: 08/10/2023 3:00 PM    Performed by: Michaele Adjutant, APRN-NP  Authorized by: Michaele Adjutant, APRN-NP  Consent obtained: written  Consent given by: patient  Procedural risks discussed:  Allergic reaction, bleeding, nerve damage, headache, damage to surrounding structures, infection, pain, vasovagal reaction, weakness and visual impairment  Risks and benefits: risks and benefits discussed  Preparation: Patient was prepped and draped in the usual sterile fashion.    Anesthesia:  Local anesthesia used: no    Procedure:  Laterality: bilateral  Charges: 64615 - PR CHEMODERVATE FACIAL/TRIGEM/CERV MUSC MIGRAINE  Medications administered: 195 Units ONAbotulinumtoxinA 100 Units/1 mL  Total Units Used: 195  Total Units Wasted: 5  Patient tolerance: patient tolerated the procedure well with no immediate complications  Comments: Currently: 1 migraine per month    Preparation: no contraindications noted to Botox; possible medications prior to procedure: Emla cream 2.5%/2.5% topically prior to procedure; Preparation of site with alcohol wipes or betadine  Muscles/Sites Injected  A - Bilateral Corrugator - 10 units divided in 2 sites  B - Midline Procerus - 5 units in 1 site  C - Bilateral Frontalis - 20 units divided in 4 sites, additional 10 units divided in 2 sites  D - Bilateral Temporalis - 40 units divided in 8 sites  E - Bilateral Occipitalis - 30 units divided in 6 sites, additional 10 units divided in 2 sites  F - Bilateral Cervical Paraspinals - 20 units divided in 4 sites  G - Bilateral Trapezius - 30 units divided in 6 sites, additional 20 units divided in 4 sites     Total Dose - 195 Units divided in 39 sites       The plan is to be using the PREEMPT paradigm (Aurora SK, et. al. Cephalalgia, 2010;30:793)  with follow the pain method as needed.   Time Out Performed: Identified correct patient name/DOB, injection sites, medication: onabotulinum toxin A, using alcohol preps at sites.   Confirmed: patient, procedure, side, site, safety procedures followed.

## 2023-08-11 DIAGNOSIS — G43709 Chronic migraine without aura, not intractable, without status migrainosus: Secondary | ICD-10-CM

## 2023-08-17 ENCOUNTER — Encounter: Admit: 2023-08-17 | Discharge: 2023-08-17 | Payer: BLUE CROSS/BLUE SHIELD

## 2023-08-17 DIAGNOSIS — G43709 Chronic migraine without aura, not intractable, without status migrainosus: Secondary | ICD-10-CM

## 2023-08-17 MED ORDER — ONDANSETRON 4 MG PO TBDI
4 mg | ORAL_TABLET | ORAL | 0 refills | 8.00000 days | Status: AC | PRN
Start: 2023-08-17 — End: ?

## 2023-08-17 NOTE — Telephone Encounter
 [place], refill cannot be delegated. Fax stated this was previously prescribed by Dr. Kristine Phalen. You sent a prescription 03/02/23 #30 + 0 refills. Next appt 11/09/23 for botox and 12/07/23 for follow up.

## 2023-09-20 ENCOUNTER — Encounter: Admit: 2023-09-20 | Discharge: 2023-09-20 | Payer: BLUE CROSS/BLUE SHIELD

## 2023-10-12 ENCOUNTER — Encounter: Admit: 2023-10-12 | Discharge: 2023-10-12 | Payer: BLUE CROSS/BLUE SHIELD

## 2023-10-12 NOTE — Telephone Encounter
 Received PA request for Emgality. Completed on covermymeds and attached clinicals. [employer] has not yet replied to your PA request. Depending on the information you've provided, additional questions may be returned by the plan. You may close this dialog, return to your dashboard, and perform other tasks.    To check for an update later, open this request again from your dashboard.    If [employer] has not replied to your request within 24 hours please contact [employer] at [phone].

## 2023-10-12 NOTE — Telephone Encounter
Received PA approval for Emgality. Notified pharmacy.

## 2023-11-09 ENCOUNTER — Encounter: Admit: 2023-11-09 | Discharge: 2023-11-09 | Payer: BLUE CROSS/BLUE SHIELD

## 2023-11-09 ENCOUNTER — Ambulatory Visit: Admit: 2023-11-09 | Discharge: 2023-11-10 | Payer: BLUE CROSS/BLUE SHIELD

## 2023-11-09 MED ORDER — ONABOTULINUMTOXINA 100 UNIT IJ SOLR
195 [IU] | Freq: Once | INTRAMUSCULAR | 0 refills | Status: CP | PRN
Start: 2023-11-09 — End: ?

## 2023-11-09 MED ORDER — ONDANSETRON 4 MG PO TBDI
4 mg | ORAL_TABLET | ORAL | 0 refills | 8.00000 days | Status: AC | PRN
Start: 2023-11-09 — End: ?

## 2023-11-09 NOTE — Patient Instructions
 Do NOT massage or apply pressure on the treated area for 4hrs after treatment since Botox may migrate to areas of undesirable effectiveness.   Do NOT lie down for 4 hours after treatment. This is to avoid the risk of pressure on the treated areas. Also Do NOT lean forward.  Avoid rigorous exercise/activities, extensive heat (eg. sauna, hot tub, tanning) and sun exposure, and alcoholic beverages for the first 24 hours after treatment. This may cause temporary redness, swelling, and/or itching at the injection sites. Feel free to shower and go about most other daily activities.  You may experience a mild headache after Botox. Should this occur, we recommend you avoid aspirin or aspirin containing products. You may opt instead to use acetaminophen, and/or cool compresses. Cold compresses may be used 10 minutes on 10 minutes off to reduce swelling 2-3 times per day during the first 1-2 days if needed.    Note that any bumps or marks will go away in a few hours. If you do develop a bruise it will resolve like any other bruises you may have had in about a week. There is occasionally some mild pain, swelling, itching, or redness at the site of injection similar to most other injections. Redness may last for 1-2 days, rarely longer. You may apply cool compresses or take acetaminophen to reduce swelling or discomfort.  Please call the office with any questions or concerns.        It is patient's responsibility to notify the clinic of any insurance changes, at least 2 weeks prior to any Botox injection appointment, to allow for prior authorization update.     Botox Savings Program:  [employer] will reimburse patients up to $1000 per treatment  [email or web address] or [phone] Option 4 to register and submit claims.    You will need your EOB with patient responsibility listed.    If your EOB does not include CPT and J-code you will want to write those on the EOB to prevent delayed processing:   CPT code: 84696  J-code:  [phone]    Preferred method of communication is through MyChart message, if the issue cannot wait until your next scheduled follow up.   MyChart may be used for non-emergent communication. Emails are not reviewed after hours or over the weekend/holidays/after 4PM. Staff will reply to your email within 24-48 business hours.     If you are having acute (new/sudden onset) or severe/worsening neurologic symptoms, please call 911 or seek care in ED.    For scheduling of IMAGING/RADIOLOGY, please call [phone] at your convenience to schedule your studies.  For referrals placed during the visit, if you have not heard from scheduling within one week, please call the call center at [phone] to get scheduling assistance.  For refills on medications, please first contact your pharmacy, who will fax a refill authorization request form to our office.  Weekdays only. Allow up to 2 business days for refills. Please plan ahead, as refills will not be filled after hours or on weekends.    Our front desk staff, Genice Rouge, or Marcelino Duster may be reached at [phone] option 1 for scheduling needs.   My Nurse may be contacted at [phone] option 2 for urgent needs. Staff will return your call within 24 business hours.     For Appointments:   Please try to arrive early for your appointment time to help facilitate your visit. 15 minutes early is recommended.   If you are late to your appointment, we reserve the  right to ask you to reschedule or wait until next available time to be seen in fairness to other patients scheduled that day.   There are times when we are running behind in clinic. Our goal is to always be on time, however, there are time when unexpected events occur with patients, which may cause a delay. We appreciate your understanding when this occurs.

## 2023-11-09 NOTE — Progress Notes
 [place] [facility] SPINE CHEMODENERVATION MUSCLE MIGRAINE    Date/Time: 11/09/2023 9:20 AM    Performed by: Georganne Richerd SAUNDERS, APRN-NP  Authorized by: Georganne Richerd SAUNDERS, APRN-NP  Consent obtained: written  Consent given by: patient  Procedural risks discussed:  Allergic reaction, bleeding, nerve damage, headache, damage to surrounding structures, infection, pain, vasovagal reaction, weakness and visual impairment  Risks and benefits: risks and benefits discussed  Preparation: Patient was prepped and draped in the usual sterile fashion.    Anesthesia:  Local anesthesia used: no    Procedure:  Laterality: bilateral  Charges: 64615 - PR CHEMODERVATE FACIAL/TRIGEM/CERV MUSC MIGRAINE  Medications administered: 195 Units ONAbotulinumtoxinA  100 Units/1 mL  Total Units Used: 195  Total Units Wasted: 5  Patient tolerance: patient tolerated the procedure well with no immediate complications  Comments: Currently: 1 migraine per month    Preparation: no contraindications noted to Botox ; possible medications prior to procedure: Emla cream 2.5%/2.5% topically prior to procedure; Preparation of site with alcohol wipes or betadine  Muscles/Sites Injected  A - Bilateral Corrugator - 10 units divided in 2 sites  B - Midline Procerus - 5 units in 1 site  C - Bilateral Frontalis - 20 units divided in 4 sites, additional 10 units divided in 2 sites  D - Bilateral Temporalis - 40 units divided in 8 sites  E - Bilateral Occipitalis - 30 units divided in 6 sites, additional 10 units divided in 2 sites  F - Bilateral Cervical Paraspinals - 20 units divided in 4 sites  G - Bilateral Trapezius - 30 units divided in 6 sites, additional 20 units divided in 4 sites     Total Dose - 195 Units divided in 39 sites       The plan is to be using the PREEMPT paradigm (Aurora SK, et. al. Cephalalgia, 2010;30:793)  with follow the pain method as needed.   Time Out Performed: Identified correct patient name/DOB, injection sites, medication: onabotulinum toxin A, using alcohol preps at sites.   Confirmed: patient, procedure, side, site, safety procedures followed.

## 2023-11-10 DIAGNOSIS — M542 Cervicalgia: Secondary | ICD-10-CM

## 2023-11-10 DIAGNOSIS — G43709 Chronic migraine without aura, not intractable, without status migrainosus: Principal | ICD-10-CM

## 2023-12-04 ENCOUNTER — Ambulatory Visit: Admit: 2023-12-04 | Discharge: 2023-12-04 | Payer: BLUE CROSS/BLUE SHIELD

## 2023-12-04 ENCOUNTER — Encounter: Admit: 2023-12-04 | Discharge: 2023-12-04 | Payer: BLUE CROSS/BLUE SHIELD

## 2023-12-04 DIAGNOSIS — S29019A Strain of muscle and tendon of unspecified wall of thorax, initial encounter: Secondary | ICD-10-CM

## 2023-12-04 MED ORDER — TIZANIDINE 2 MG PO TAB
2 mg | ORAL_TABLET | Freq: Every evening | ORAL | 1 refills | 30.00000 days | Status: AC
Start: 2023-12-04 — End: ?

## 2023-12-04 NOTE — Progress Notes
 [facility] HISTORY AND PHYSICAL         HISTORY OF PRESENT ILLNESS:      Referring provider: Ethel Bruns    Chief complaint: Pain of the Neck and New Patient      12/04/23   Debra Fleming is a 38 y.o. 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    Patient is a 38 year old with a history of breast cancer and a 5-year history of migraines.  She states that her migraines have significantly improved over the last couple of years with her current regimen provided by the neurology team.  Her regimen includes multiple medications Botox  injections for 11 weeks.  She now has about 2 migraines per month.  Today she presents with acute mid back pain between the shoulder blades.  She states that about a week ago she was reaching behind her in a car and had a muscle spasm, which has continued to bother her since that time.  The pain is felt between her shoulder blades bilaterally.  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  She referred here by the neurology team who stated that she had extremely tight muscles Botox  injections every 11 weeks.  She denies any history of back surgeries.  Denies any saddle anesthesia, bowel or bladder incontinence, radicular symptoms.    Patient-entered data is noted with quotes.    Pain location: (Patient-Rptd) Upper back  Pain radiation:   Does the pain move into your arm or leg?: (Patient-Rptd) No  Pain start date: (Patient-Rptd) Greater than 1 year     Inciting event: (Patient-Rptd) Yes       Description of pain: (Patient-Rptd) Aching, Stabbing, Shooting, Sharp Numbness/tingling/weakness: (Patient-Rptd) None      Bladder or bowel retention or incontinence?: No  Exacerbating factors: (Patient-Rptd) Bend      Alleviating factors: (Patient-Rptd) Rest, Heat      Anticoagulants:   Are you taking one of the following blood thinners?: (Patient-Rptd) No    TREATMENT FOR CONDITION DATE  % OF RELIEF   Physical Therapy            How long did you do physical therapy? (in weeks)              Chiropractic Care            How many times did you see the chiropractor?              Injection            (Patient-Rptd) 11/09/23     What type of spine injection have you had in the past?    (Patient-Rptd) Botox        (Patient-Rptd) 50% and greater     Surgery    (Patient-Rptd) No         What type of spine surgery have you had in the past?                Pain diagram:        Past Medical History:    Acid reflux    Allergy    Anxiety    Anxiety disorder    Gastrointestinal disorder  History of chemotherapy    Limb alert care status    Malignant neoplasm of right breast (CMS-HCC)    Migraines    Neuromuscular disorder (CMS-HCC)    Other dysphagia    Other malignant neoplasm without specification of site    PONV (postoperative nausea and vomiting)     Surgical History:   Procedure Laterality Date    LIPOMA RESECTION  2004    back    BREAST BIOPSY Right 09/11/2014    BILATERAL NIPPLE-SPARING MASTECTOMIES, RIGHT SENTINEL LYMPH NODE BIOPSY Bilateral 10/22/2014    Performed by Caleen Palma, MD at [facility] OR    INSERTION TISSUE EXPANDER BREAST Bilateral 10/22/2014    Performed by Glorious Charlie LABOR, MD at [facility] OR    INSERTION VENOUS ACCESS PORT Left 10/22/2014    Performed by Shirlean Rush, MD at [facility] OR    TUNNELED VENOUS PORT REMOVAL  2017    RECONSTRUCTION BREAST/ BILATERAL TISSUE EXPANDER REMOVAL/ SILICONE IMPLANT PLACEMENT, PORT REMOVAL Bilateral 05/04/2015    Performed by Glorious Charlie LABOR, MD at [facility] OR    BILATERAL CAPSULOTOMIES Bilateral 05/04/2015    Performed by Glorious Charlie LABOR, MD at [facility] OR    INSERTION FAT GRAFT BREAST FROM ABDOMEN Bilateral 10/14/2015    Performed by Glorious Charlie LABOR, MD at [facility] OR    LEFT BREAST MOUND REVISION WITH FAT GRAFTING; RIGHT BREAST RECONSTRUCTION WITH FAT GRAFTING Bilateral 04/01/2016    Performed by Glorious Charlie LABOR, MD at [facility] OR    Bilateral fat grafting from abdomen and thighs Bilateral 04/01/2016    Performed by Glorious Charlie LABOR, MD at [facility] OR    BILATERAL BREAST REVISION WITH CASULOTOMY AND IMPLANT EXCHANGE Bilateral 10/12/2016    Performed by Glorious Charlie LABOR, MD at [facility] OR    LIPOSUCTION TO AXILLARY FOLDS Bilateral 10/12/2016    Performed by Glorious Charlie LABOR, MD at [facility] OR    TISSUE GRAFT - Bilateral breast fat grafting from abdominal donor site Bilateral 07/26/2017    Performed by Glorious Charlie LABOR, MD at [facility] OR    PERIPROSTHETIC CAPSULECTOMY BREAST-Left lateral capsulectomy with ADM support Left 07/26/2017    Performed by Glorious Charlie LABOR, MD at [facility] OR    REVISION RECONSTRUCTED BREAST Right 07/26/2017    Performed by Glorious Charlie LABOR, MD at [facility] OR    GRAFTING AUTOLOGOUS FAT BY LIPOSUCTION TO BREASTS LEFT 165 CC, RIGHT 168 CC TOTAL 333 CC Bilateral 03/15/2019    Performed by Glorious Charlie LABOR, MD at [facility] OR    GRAFTING AUTOLOGOUS FAT BY LIPOSUCTION TO BREASTS LEFT 165 CC, RIGHT 168 CC TOTAL 333 CC Bilateral 03/15/2019    Performed by Glorious Charlie LABOR, MD at [facility] OR    INSERTION SIENTRA [phone] BREAST PROSTHESIS IN RECONSTRUCTION - IMMEDIATE Bilateral 03/15/2019    Performed by Glorious Charlie LABOR, MD at [facility] OR    GRAFTING AUTOLOGOUS FAT BY LIPOSUCTION TO TRUNK/ BREASTS/ SCALP/ ARMS/ LEGS - 50 CC OR LESS Bilateral 12/09/2019    Performed by Glorious Charlie LABOR, MD at [facility] OR    GRAFTING AUTOLOGOUS FAT BY LIPOSUCTION TO TRUNK/ BREASTS/ SCALP/ ARMS/ LEGS - EACH ADDITIONAL 50 CC Bilateral 12/09/2019    Performed by Glorious Charlie LABOR, MD at [facility] OR    ADJACENT TISSUE TRANSFER/ REARRANGEMENT 10 SQ CM OR LESS - TORSO Right 12/09/2019    Performed by Glorious Charlie LABOR, MD at [facility] OR    BREAST RECONSTRUCTION Bilateral 08/17/2020    BILATERAL PERI-IMPLANT CAPSULECTOMY BREAST - COMPLETE,  INCLUDING REMOVAL ALL INTRACAPSULAR CONTENTS Bilateral 08/17/2020    Performed by Glorious Charlie LABOR, MD at [facility] OR    ABDOMINAL EXCISION EXCESSIVE SKIN/ SUBCUTANEOUS TISSUE - ABDOMEN WITH UMBILICAL TRANSPOSITION AND FASCIAL PLICATION  N/A 08/17/2020    Performed by Glorious Charlie LABOR, MD at [facility] OR    GRAFTING AUTOLOGOUS FAT BY LIPOSUCTION TO BREASTS/ CHEST - 50 CC OR LESS FROM ABDOMEN, FLANKS, HIPS, THIGHS Bilateral 08/17/2020    Performed by Glorious Charlie LABOR, MD at [facility] OR    GRAFTING AUTOLOGOUS FAT BY LIPOSUCTION TO BREASTS/ CHEST - FROM ABDOMEN, FLANKS, HIPS, THIGHS - EACH ADDITIONAL 50 CC  08/17/2020    Performed by Korentager, Charlie LABOR, MD at [facility] OR    CHEMO CART      COSMETIC SURGERY      HX CHOLECYSTECTOMY      HX HYSTERECTOMY  5-24    HX HYSTERECTOMY      LYMPH NODE BIOPSY      TUNNELED VENOUS PORT PLACEMENT       Allergies[1]    Medications Ordered Prior to Encounter[2]    family history includes Arthritis in her maternal grandmother, mother, and paternal grandmother; Arthritis-rheumatoid in her maternal aunt, maternal grandmother, mother, and paternal grandmother; Back pain in her father; Cancer in her maternal grandfather and paternal grandmother; Cancer-Lung in her maternal grandfather and maternal grandmother; Cancer-Prostate in her paternal grandfather; Cancer-Uterine in her paternal grandmother; Diabetes in her paternal grandfather; High Cholesterol in her mother.    Social History     Socioeconomic History    Marital status: Married   Tobacco Use    Smoking status: Former     Current packs/day: 0.00     Average packs/day: 0.3 packs/day for 4.7 years (1.2 ttl pk-yrs)     Types: Cigarettes     Start date: 08/10/2001     Quit date: 08/10/2005     Years since quitting: 18.3    Smokeless tobacco: Never   Vaping Use    Vaping status: Never Used   Substance and Sexual Activity    Alcohol use: Yes     Alcohol/week: 2.0 - 4.0 standard drinks of alcohol     Types: 2 - 4 Standard drinks or equivalent per week     Comment: rarely    Drug use: Never    Sexual activity: Yes     Partners: Male     Birth control/protection: Surgical     Review of Systems  Full review of systems was obtained. Please see any pertinent positives and negatives as stated above.    PHYSICAL EXAM:    Vitals:    12/04/23 1429   BP: 135/89   BP Source: Arm, Left Upper   Pulse: 95   SpO2: 100%   PainSc: Seven     Oswestry Total Score:: (Patient-Rptd) 26  Pain Score: Seven  There is no height or weight on file to calculate BMI.    General: Alert, cooperative, no acute distress.  HEENT: Normocephalic, atraumatic.  Neck: Supple.  Lungs: Unlabored respirations, bilateral and equal chest excursion.  Heart: Regular rate.  Skin: Warm and dry to touch.  Abdomen: Nondistended.    Cervical spine:  Cervical tenderness: Yes  Pain with extension: No  Pain with lateral flexion: None  Limited neck ROM: No  Sensation to light touch - left upper extremity: intact  Sensation to light touch - right upper extremity: intact  Hoffman sign: Negative bilaterally    Upper extremity strength:   Root  Right Left   Shoulder Abduction C5 5 5   Elbow Flexion C5 5 5   Elbow Extension C7 5 5   Wrist Extension C6 5 5   Finger Flexion C8 5 5   Finger Abduction T1 5 5     Thoracic spine:  Tenderness to palpation between shoulder blades bilaterally, no deformities noted radicular pain  Lumbar spine:  Appearance: No lesions or deformity  Lumbar tenderness: No  SI joint tenderness: Negative bilaterally  Pain with extension: No  Pain with lateral flexion: None  Sensation to light touch - left lower extremity: intact  Sensation to light touch - right lower extremity: intact  Straight leg raise: Negative bilaterally  FABER test: Negative bilaterally  FADIR test: Negative bilaterally  Lateral pelvic compression: Deferred  Thigh thrust: Deferred  Gaenslen test: Deferred    Lower extremity strength:   Root Right Left   Hip Flexion L2 5 5   Knee Flexion L5/S1 5 5   Knee Extension L3 5 5   Dorsiflexion L4 5 5   Plantarflexion S1 5 5   EHL Extension L5 5 5     Neurological: Alert and oriented x3.      RADIOGRAPHIC EVALUATION:    MRI C-Spine Results:    No results found for this or any previous visit.      MRI L-Spine Results:    No results found for this or any previous visit.      MRI T-Spine Results:    No results found for this or any previous visit.         IMPRESSION:    1. Thoracic myofascial strain, initial encounter          PLAN:   Debra Fleming with a history of migraines for 5 years and tight muscles, with an acute strain in the last week of her thoracic muscles    Plan:  External prescription for 6 weeks of physical therapy, 2-3 times per week working on thoracic flexibility and strengthening as well as soft tissue mobilization; follow-up after 6 weeks of physical therapy  Thoracic x-rays today, consider thoracic MRI after physical therapy if no improvement  Will prescribe tizanidine  2 mg nightly, patient is currently on baclofen  instructed patient to take tizanidine  at night only for acute flares in thoracic back pain  Would be reasonable to extend Botox  injections to area between scapula, will defer to neurology who does her Botox  injections  Patient currently has an effective regimen for her migraines, discussed potential occipital nerve blocks or supraorbital nerve blocks in addition to her current regimen should she have worsening of her migraines in the future  Continue current medications otherwise    Jayson Leontine Dayhoff MD  Pain Fellow, PGY5  Attending: Paulita MD     ATTESTATION    I personally performed the key portions of the E/M visit, discussed case with resident and concur with resident documentation of history, physical exam, assessment, and treatment plan unless otherwise noted.    Staff name: Brayton Paulita, MD Date: 12/04/2023              [1]   Allergies  Allergen Reactions    Adhesive RASH and UNKNOWN    Chlorhexidine RASH     CHG dressing    Penicillin G HEADACHE and RASH     childhood allergy    Scopolamine NAUSEA AND VOMITING   [2]   Current Outpatient Medications on File Prior to Visit   Medication Sig Dispense  Refill    ALPRAZolam (XANAX) 0.5 mg tablet TAKE 1 TABLET BY MOUTH TWICE DAILY as needed for anxiety      baclofen  (LIORESAL ) 10 mg tablet Take one tablet by mouth twice daily. 60 tablet 11    doxycycline  monohydrate (MONODOX ) 100 mg capsule Take one capsule by mouth as Needed.      gabapentin  (NEURONTIN ) 300 mg capsule take 3 capsules BY MOUTH at bedtime DAILY 270 capsule 3    galcanezumab -gnlm (EMGALITY  PEN) 120 mg/mL subcutaneous PEN Inject 1 mL under the skin every 30 days. Inject 1 mL under the skin every 30 days. 1 mL 5    minoxidiL (LONITEN) 2.5 mg tablet take 1/4 tablet BY MOUTH once daily      mirtazapine (REMERON) 15 mg tablet Take one tablet by mouth at bedtime daily.      ONAbotulinum toxin A (BOTOX ) 100 Units/1 mL injection Inject into the muscle as directed. For Migraines.      ondansetron  (ZOFRAN  ODT) 4 mg rapid dissolve tablet Dissolve one tablet by mouth every 6 hours as needed. Place on tongue to dissolve. 30 tablet 0    phentermine (ADIPEX-P) 37.5 mg tablet Take one tablet by mouth daily before breakfast.      QUEtiapine (SEROQUEL) 100 mg tablet Take one tablet by mouth at bedtime daily.      rimegepant (NURTEC ODT ) 75 mg rapid dissolve tablet Dissolve one tablet by mouth daily as needed for Migraine symptoms. Indications: a migraine headache 8 tablet 11    topiramate  (TOPAMAX ) 100 mg tablet Take one tablet by mouth twice daily. Indications: migraine prevention 180 tablet 3     No current facility-administered medications on file prior to visit.

## 2023-12-05 DIAGNOSIS — M542 Cervicalgia: Secondary | ICD-10-CM

## 2023-12-05 DIAGNOSIS — G43709 Chronic migraine without aura, not intractable, without status migrainosus: Principal | ICD-10-CM

## 2023-12-06 ENCOUNTER — Encounter: Admit: 2023-12-06 | Discharge: 2023-12-06 | Payer: BLUE CROSS/BLUE SHIELD

## 2023-12-06 MED ORDER — CELECOXIB 100 MG PO CAP
100 mg | ORAL_CAPSULE | Freq: Every day | ORAL | 1 refills | 30.00000 days | Status: AC
Start: 2023-12-06 — End: ?

## 2023-12-07 ENCOUNTER — Ambulatory Visit: Admit: 2023-12-07 | Discharge: 2023-12-08 | Payer: BLUE CROSS/BLUE SHIELD

## 2023-12-07 ENCOUNTER — Encounter: Admit: 2023-12-07 | Discharge: 2023-12-07 | Payer: BLUE CROSS/BLUE SHIELD

## 2023-12-07 DIAGNOSIS — G43709 Chronic migraine without aura, not intractable, without status migrainosus: Principal | ICD-10-CM

## 2023-12-07 DIAGNOSIS — M542 Cervicalgia: Secondary | ICD-10-CM

## 2023-12-07 NOTE — Progress Notes
 Telehealth Consent: Audio + Video utilized  This visit was completed via Landscape [employer]. All issues documented were discussed and addressed but no physical exam was performed unless allowed by visual confirmation on EPIC. If it was felt that the patient should be evaluated in clinic then they were directed there.       Subjective:       History of Present Illness  Debra Fleming is a 38 y.o. female who is a patient of Dr. Evaristo for chronic migraines presenting over telehealth for 6 month follow up. Please see below for any updates or changes to current symptoms.      Onset: Childhood but worsened in the last 4 years with chemotherapy  Location: bilateral frontotemporal and into the neck- same  Quality: pressure (in frontal) constant, sharp (with severe migraines), tension (neck/occipital) -same  Severity: Averaging 4-5/10  Average number of headache days per month: Debra Fleming previously reported averaging 1 migraine per month. Today Debra Fleming reports averaging 1-2 migraines per month. She reports ongoing lingering daily sinus pressure. She previously saw ENT and reports normal sinuses --> 1 migraine per month  Duration: 12 hours up to 3 days  Aggravating symptoms: phonophobia, osmophobia, photophobia tingling/numbness in upper brow and moved into cheeks and lips (this has significantly improved since starting Botox  and Emgality ) -same  Denies: lacrimation/rhinorrhea, positional changes, ringing/roaring of the ears, nausea, vomiting  Triggers: weather changes, stress, same day migraine w/ Botox  -same  Aura: denies  Sleep/snoring: She does snore. She takes sleeping medication. She has trouble initiating sleep and staying asleep. Previously on Ambien . PCP switched her over Seroquel. Migraines do not wake her up out of her sleep. Works with Dr. Zenon. At Home Sleep study 08/25/21:  No signs of OSA.  Last Eye Exam: 2025- goes annually. Normal findings.     Current Migraine Medications:   Topiramate  100mg  daily (prescribed by outsider provider)- denies kidney stones.   Botox  Q11 weeks- works great for reducing frequency and severity of migraines. Q11 weeks due to wearing off effect  Emgality  120mg  monthly CGRP- denies concerns. Works well  Baclofen10mg  HS- helps with tension in the neck  Nurtec 75mg  prn- dulls the migraine. She feels this is effective enough to not want to change at this time.    Referred to pain clinic. OV with Dr. Paulita reviewed from 12/04/23: 6 weeks of physical therapy, 2-3 times per week working on thoracic flexibility and strengthening as well as soft tissue mobilization; follow-up after 6 weeks of physical therapy  Thoracic x-rays today, consider thoracic MRI after physical therapy if no improvement  Will prescribe tizanidine  2 mg nightly, patient is currently on baclofen  instructed patient to take tizanidine  at night only for acute flares in thoracic back pain  Would be reasonable to extend Botox  injections to area between scapula, will defer to neurology who does her Botox  injections     Previous tried/failed medications:   Sumatriptan - Effective but mildly bothersome side effects  Amitriptyline - not effective  Metoprolol - not effective   Rizatriptan- side effects  Venlafaxine - no benefit    Relevant PMHx:  Stage 2A breast cancer s/p mastectomy   Migraines   Insomnia   MDD    Prior Workup:   [facility] MRI Head 04/24/20: normal   Thoracic X-rays 12/04/23: 1.  Mild right convex curvature centered at the thoracolumbar junction.   Otherwise normal alignment. Vertebral body heights are maintained. 2.  Intervertebral disc spaces are within normal limits with minimal  multilevel degenerative endplate spurring   __________________________________________________________________________________    Past Medical History:    Acid reflux    Allergy    Anxiety    Anxiety disorder    Gastrointestinal disorder    History of chemotherapy    Limb alert care status    Malignant neoplasm of right breast (CMS-HCC)    Migraines Neuromuscular disorder (CMS-HCC)    Other dysphagia    Other malignant neoplasm without specification of site    PONV (postoperative nausea and vomiting)     Surgical History:   Procedure Laterality Date    LIPOMA RESECTION  2004    back    BREAST BIOPSY Right 09/11/2014    BILATERAL NIPPLE-SPARING MASTECTOMIES, RIGHT SENTINEL LYMPH NODE BIOPSY Bilateral 10/22/2014    Performed by Caleen Palma, MD at [facility] OR    INSERTION TISSUE EXPANDER BREAST Bilateral 10/22/2014    Performed by Glorious Charlie LABOR, MD at [facility] OR    INSERTION VENOUS ACCESS PORT Left 10/22/2014    Performed by Shirlean Rush, MD at [facility] OR    TUNNELED VENOUS PORT REMOVAL  2017    RECONSTRUCTION BREAST/ BILATERAL TISSUE EXPANDER REMOVAL/ SILICONE IMPLANT PLACEMENT, PORT REMOVAL Bilateral 05/04/2015    Performed by Glorious Charlie LABOR, MD at [facility] OR    BILATERAL CAPSULOTOMIES Bilateral 05/04/2015    Performed by Glorious Charlie LABOR, MD at [facility] OR    INSERTION FAT GRAFT BREAST FROM ABDOMEN Bilateral 10/14/2015    Performed by Glorious Charlie LABOR, MD at [facility] OR    LEFT BREAST MOUND REVISION WITH FAT GRAFTING; RIGHT BREAST RECONSTRUCTION WITH FAT GRAFTING Bilateral 04/01/2016    Performed by Glorious Charlie LABOR, MD at [facility] OR    Bilateral fat grafting from abdomen and thighs Bilateral 04/01/2016    Performed by Glorious Charlie LABOR, MD at [facility] OR    BILATERAL BREAST REVISION WITH CASULOTOMY AND IMPLANT EXCHANGE Bilateral 10/12/2016    Performed by Glorious Charlie LABOR, MD at [facility] OR    LIPOSUCTION TO AXILLARY FOLDS Bilateral 10/12/2016    Performed by Glorious Charlie LABOR, MD at [facility] OR    TISSUE GRAFT - Bilateral breast fat grafting from abdominal donor site Bilateral 07/26/2017    Performed by Glorious Charlie LABOR, MD at [facility] OR    PERIPROSTHETIC CAPSULECTOMY BREAST-Left lateral capsulectomy with ADM support Left 07/26/2017    Performed by Glorious Charlie LABOR, MD at [facility] OR    REVISION RECONSTRUCTED BREAST Right 07/26/2017    Performed by Glorious Charlie LABOR, MD at [facility] OR    GRAFTING AUTOLOGOUS FAT BY LIPOSUCTION TO BREASTS LEFT 165 CC, RIGHT 168 CC TOTAL 333 CC Bilateral 03/15/2019    Performed by Glorious Charlie LABOR, MD at [facility] OR    GRAFTING AUTOLOGOUS FAT BY LIPOSUCTION TO BREASTS LEFT 165 CC, RIGHT 168 CC TOTAL 333 CC Bilateral 03/15/2019    Performed by Glorious Charlie LABOR, MD at [facility] OR    INSERTION SIENTRA [phone] BREAST PROSTHESIS IN RECONSTRUCTION - IMMEDIATE Bilateral 03/15/2019    Performed by Glorious Charlie LABOR, MD at [facility] OR    GRAFTING AUTOLOGOUS FAT BY LIPOSUCTION TO TRUNK/ BREASTS/ SCALP/ ARMS/ LEGS - 50 CC OR LESS Bilateral 12/09/2019    Performed by Glorious Charlie LABOR, MD at [facility] OR    GRAFTING AUTOLOGOUS FAT BY LIPOSUCTION TO TRUNK/ BREASTS/ SCALP/ ARMS/ LEGS - EACH ADDITIONAL 50 CC Bilateral 12/09/2019    Performed by Glorious Charlie LABOR, MD at [facility] OR    ADJACENT TISSUE TRANSFER/ REARRANGEMENT 10  SQ CM OR LESS - TORSO Right 12/09/2019    Performed by Glorious Charlie LABOR, MD at [facility] OR    BREAST RECONSTRUCTION Bilateral 08/17/2020    BILATERAL PERI-IMPLANT CAPSULECTOMY BREAST - COMPLETE, INCLUDING REMOVAL ALL INTRACAPSULAR CONTENTS Bilateral 08/17/2020    Performed by Glorious Charlie LABOR, MD at [facility] OR    ABDOMINAL EXCISION EXCESSIVE SKIN/ SUBCUTANEOUS TISSUE - ABDOMEN WITH UMBILICAL TRANSPOSITION AND FASCIAL PLICATION  N/A 08/17/2020    Performed by Glorious Charlie LABOR, MD at [facility] OR    GRAFTING AUTOLOGOUS FAT BY LIPOSUCTION TO BREASTS/ CHEST - 50 CC OR LESS FROM ABDOMEN, FLANKS, HIPS, THIGHS Bilateral 08/17/2020    Performed by Glorious Charlie LABOR, MD at [facility] OR    GRAFTING AUTOLOGOUS FAT BY LIPOSUCTION TO BREASTS/ CHEST - FROM ABDOMEN, FLANKS, HIPS, THIGHS - EACH ADDITIONAL 50 CC  08/17/2020    Performed by Glorious Charlie LABOR, MD at [facility] OR    CHEMO CART      COSMETIC SURGERY      HX CHOLECYSTECTOMY      HX HYSTERECTOMY  5-24    HX HYSTERECTOMY      LYMPH NODE BIOPSY      TUNNELED VENOUS PORT PLACEMENT       Social History     Tobacco Use Smoking status: Former     Current packs/day: 0.00     Average packs/day: 0.3 packs/day for 4.7 years (1.2 ttl pk-yrs)     Types: Cigarettes     Start date: 08/10/2001     Quit date: 08/10/2005     Years since quitting: 18.3    Smokeless tobacco: Never   Vaping Use    Vaping status: Never Used   Substance Use Topics    Alcohol use: Yes     Alcohol/week: 2.0 - 4.0 standard drinks of alcohol     Types: 2 - 4 Standard drinks or equivalent per week     Comment: rarely    Drug use: Never     Family History   Problem Relation Name Age of Onset    High Cholesterol Mother Rosaline     Arthritis-rheumatoid Mother Rosaline     Arthritis-rheumatoid Maternal Aunt Anne?s     Cancer-Lung Maternal Grandmother v     Arthritis-rheumatoid Maternal Grandmother v     Cancer-Lung Maternal Grandfather Dutch     Cancer-Uterine Paternal Grandmother Vertell     Arthritis-rheumatoid Paternal Grandmother Vertell     Cancer-Prostate Paternal Grandfather Elgin     Diabetes Paternal Grandfather Elgin     Arthritis Mother Rosaline     Arthritis Maternal Grandmother v     Cancer Maternal Grandfather Dutch     Cancer Paternal Grandmother Vertell     Arthritis Paternal Grandmother Vertell     Back pain Father darrell      Allergies   Allergen Reactions    Adhesive RASH and UNKNOWN    Chlorhexidine RASH     CHG dressing    Penicillin G HEADACHE and RASH     childhood allergy    Scopolamine NAUSEA AND VOMITING       Review of Systems  ROS negative unless otherwise specified in HPI    Objective:          ALPRAZolam (XANAX) 0.5 mg tablet TAKE 1 TABLET BY MOUTH TWICE DAILY as needed for anxiety    baclofen  (LIORESAL ) 10 mg tablet Take one tablet by mouth twice daily.    celecoxib  (CELEBREX ) 100 mg capsule Take one capsule  by mouth daily.    doxycycline  monohydrate (MONODOX ) 100 mg capsule Take one capsule by mouth as Needed.    gabapentin  (NEURONTIN ) 300 mg capsule take 3 capsules BY MOUTH at bedtime DAILY    galcanezumab -gnlm (EMGALITY  PEN) 120 mg/mL subcutaneous PEN Inject 1 mL under the skin every 30 days. Inject 1 mL under the skin every 30 days.    minoxidiL (LONITEN) 2.5 mg tablet take 1/4 tablet BY MOUTH once daily    mirtazapine (REMERON) 15 mg tablet Take one tablet by mouth at bedtime daily.    ONAbotulinum toxin A (BOTOX ) 100 Units/1 mL injection Inject into the muscle as directed. For Migraines.    ondansetron  (ZOFRAN  ODT) 4 mg rapid dissolve tablet Dissolve one tablet by mouth every 6 hours as needed. Place on tongue to dissolve.    phentermine (ADIPEX-P) 37.5 mg tablet Take one tablet by mouth daily before breakfast.    QUEtiapine (SEROQUEL) 100 mg tablet Take one tablet by mouth at bedtime daily.    rimegepant (NURTEC ODT ) 75 mg rapid dissolve tablet Dissolve one tablet by mouth daily as needed for Migraine symptoms. Indications: a migraine headache    tiZANidine  (ZANAFLEX ) 2 mg tablet Take one tablet by mouth at bedtime daily.    topiramate  (TOPAMAX ) 100 mg tablet Take one tablet by mouth twice daily. Indications: migraine prevention     There were no vitals filed for this visit.  There is no height or weight on file to calculate BMI.     PHYSICAL EXAM: limited due to telehealth  General: alert, oriented x 3  Speech: normal, no dysarthria    ASSESSMENT/PLAN:  CLINICAL SUMMARY      Debra Fleming 38 y.o. female is here today for follow up of chronic migraines and headaches.     Impression:  1. Chronic migraine without aura, not intractable, without status migrainosus    2. Neck pain        RECOMMENDATIONS  1. Continue onabotulinumtoxinA  for migraine prevention   2. Continue emgality  120 mg Q30 days for migraine prevention   3. Continue topiramate  100 mg daily for migraine prevention   4. Continue baclofen  10 mg twice daily as needed for neck pains. Do not combine with Tizanidine  prescription (from pain clinic)  5. Continue to work with pain clinic for trap/shoulder pain.    For Acute Headache:  -- For a more severe headache, take Nurtec 75mg  + naproxen 440/500mg  + magnesium oxide 400-500mg  +/- benadryl  25mg  (depending on if you are at work/needing to drive)  -- Please refrain from taking any combination of as needed medications more than 8-10 times per month, as this can lead to medication overuse/rebound headaches.    ---> Consider switching to Ubrelvy in the future       FOLLOWUP PLAN  No follow-ups on file.  The patient is instructed to contact me if there are any concerns with the agreed plan.    Total of 20 minutes were spent on the same day of the visit including preparing to see the patient, obtaining   and/or reviewing separately obtained history, performing a medically appropriate examination and/or   evaluation, counseling and educating the patient/family/caregiver, ordering medications, tests, or   procedures, referring and communication with other health care professionals, documenting clinical   information in the electronic or other health record, independently interpreting results and communicating   results to the patient/family/caregiver, and care coordination.

## 2023-12-15 ENCOUNTER — Encounter: Admit: 2023-12-15 | Discharge: 2023-12-15 | Payer: BLUE CROSS/BLUE SHIELD

## 2023-12-15 VITALS — BP 126/91 | HR 82 | Temp 98.10000°F | Resp 16 | Wt 184.0 lb

## 2023-12-15 DIAGNOSIS — C50411 Malignant neoplasm of upper-outer quadrant of right female breast: Principal | ICD-10-CM

## 2023-12-15 DIAGNOSIS — Z9013 Acquired absence of bilateral breasts and nipples: Secondary | ICD-10-CM

## 2023-12-15 NOTE — Patient Instructions
 Team Members:  Dr. Arlin Pottier, MD  Mady Skeeter, APRN  Denece Burkes, APRN  Olam Her, RN  Mliss Chang, RN    Contact information:   Office phone: [phone]  Office fax: [phone]  After hours phone: [phone]    Address:  [address] [place], [address]  [place], [place] [address]

## 2023-12-15 NOTE — Progress Notes
 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 Whiten is a premenopausal female diagnosed with right breast cancer at age 38.  Ms. Hintze felt a right breast lump in April 2016 and when it started to increase in size, she went to her PCP. Bilateral diagnostic mammogram 09/08/14 ([facility]) revealed a 2.3 cm irregular mass 9:00, 7 cm FTN in the right breast.  There were a few other focal symmetries in the right breast at 10:00, 12:00, and 6:00. In the left breast there was a 7 mm asymmetry in the lower inner quadrant, 7 cm FTN. Bilateral breast ultrasound 09/08/14 ([facility]) revealed in the right breast a 2.3 cm irregular hypoechoic mass at 9:00. There was also a 1.5 cm mass at 10:00, 7 cm FTN. Additional suspicious findings were noted at 6:00, 7 cm FTN measuring 7 mm and 12:00, 4 cm FTN measuring 5 mm. The right axilla appeared normal. In the left breast there was a 7 mm hypoechoic cluster of cysts with no internal vascularity. Right breast biopsy was recommended. A 6 month follow up left breast ultrasound was recommended (BI-RADS 5).      Imaging was repeated at [facility] 09/11/14.  Bilateral breast US  09/11/14 revealed an irregular right breast mass measuring up to 3.5cm at 9:00, 7cm FTN.  There was also an 8mm area at 10:00, 5cm FTN, a 4mm area at 12:00 4cm FTN, 7mm focus at 6:00 4cm FTN, 1cm area at 12:00 2cm FTN, and at 12:30 subareolar ,measuring 2 cm x 0.5 cm.  The left breast revealed a 9x4x83mm mass at 9:30, 2cm FTN.     She had three core needle biopsies 09/11/14.  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  Right breast biopsy at 12:00, 2cm FTN revealed usual ductal hyperplasia, no evidence of malignancy.  Right breast biopsy at 9:00, 7cm FTN revealed grade II IDC (ER 85%, PR 98%, Her2 1+, Hi-67: 6%).    She had a bilateral mastectomy on 10/22/14 which revealed multicentric right, grade II, IDC. Tumor #1 at 9:00 measured 2.3cm (ER 90%, PR 100%, Her2 2+, FISH: 1.5 with avg #Her2 signals 4.01 (negative), Ki-67: 6%).  Tumor #2 at 6:00 measured 6 mm (ER 96%, PR 100%, Her2 1+, Ki-67 7%).  Tumor #3 (found incidentally) in the Left outer quadrant measured 2mm (ER and PR 100%, HER2 IHC 2+, Ki-67 17%, HER2 FISH negative); 4 negative SLN.  There was a distance of 2.5cm between the first and second mass.  Distance between second and the third lesion are unknown.     She received 4 cycles of adjuvant AC (8/18-10/8/16) followed by Taxol 10/24-12/29/16 - last dose was dose dense. She took tamoxifen  06/2015-11/2021,  zoladex  12/2015-09/2020.    CHANGES SINCE LAST INTERVENTION: Ms. Isaacs returns to clinic for follow-up. She reports doing well overall. She states she has been seeing [facility] and her headaches are well managed at this time. She reports she has been having back pain, had seen the Spine specialists, and they believe it may be related to her chest wall tightness. She states she will be starting PT today for her back pain. She has noticed more nerve pain to her chest wall over the last month. Hopeful that PT for her back will  help. She does not want to restart gabapentin .             Review of Systems   Constitutional:  Negative for fatigue and unexpected weight change.   HENT: Negative.     Eyes: Negative.  Negative for visual disturbance.   Respiratory: Negative.  Negative for cough and shortness of breath.    Cardiovascular: Negative.  Negative for chest pain.   Gastrointestinal: Negative.  Negative for abdominal pain, constipation, diarrhea, nausea and vomiting.   Endocrine: Negative.    Genitourinary: Negative.  Negative for difficulty urinating.   Musculoskeletal:  Positive for back pain (starting PT). Negative for arthralgias.        Chest wall tightness, nerve pain   Skin: Negative.  Negative for rash. Allergic/Immunologic: Negative.    Neurological:  Positive for numbness (hands, stable) and headaches (improved). Negative for dizziness and weakness.   Hematological: Negative.  Negative for adenopathy.   Psychiatric/Behavioral:  Positive for sleep disturbance (improved). The patient is nervous/anxious (improved).        PMH: Negative  FAMILY HX: No family history of breast cancer.  Paternal grandmother (fallopian ca- genetic test negative). Maternal grandmother and grandfather with lung cancer. Maternal uncle throat cancer (age 11).  GYN: Menarche age 4. G2P2, Hysterectomy 10/2022, ovaries intact.  On birth control x10 years, stopped at diagnosis.   SOCIAL:  Hair dresser, married, lives in [place]    Objective:          ALPRAZolam (XANAX) 0.5 mg tablet TAKE 1 TABLET BY MOUTH TWICE DAILY as needed for anxiety    baclofen  (LIORESAL ) 10 mg tablet Take one tablet by mouth twice daily.    celecoxib  (CELEBREX ) 100 mg capsule Take one capsule by mouth daily.    doxycycline  monohydrate (MONODOX ) 100 mg capsule Take one capsule by mouth as Needed.    gabapentin  (NEURONTIN ) 300 mg capsule take 3 capsules BY MOUTH at bedtime DAILY    galcanezumab -gnlm (EMGALITY  PEN) 120 mg/mL subcutaneous PEN Inject 1 mL under the skin every 30 days. Inject 1 mL under the skin every 30 days.    minoxidiL (LONITEN) 2.5 mg tablet take 1/4 tablet BY MOUTH once daily    mirtazapine (REMERON) 15 mg tablet Take one tablet by mouth at bedtime daily.    ONAbotulinum toxin A (BOTOX ) 100 Units/1 mL injection Inject into the muscle as directed. For Migraines.    ondansetron  (ZOFRAN  ODT) 4 mg rapid dissolve tablet Dissolve one tablet by mouth every 6 hours as needed. Place on tongue to dissolve.    phentermine (ADIPEX-P) 37.5 mg tablet Take one tablet by mouth daily before breakfast.    QUEtiapine (SEROQUEL) 100 mg tablet Take one tablet by mouth at bedtime daily.    rimegepant (NURTEC ODT ) 75 mg rapid dissolve tablet Dissolve one tablet by mouth daily as needed for Migraine symptoms. Indications: a migraine headache    tiZANidine  (ZANAFLEX ) 2 mg tablet Take one tablet by mouth at bedtime daily.    topiramate  (TOPAMAX ) 100 mg tablet Take one tablet by mouth twice daily. Indications: migraine prevention     Vitals:    12/15/23 1142   BP: (!) 126/91   Pulse: 82   Temp: 36.7 ?C (98.1 ?F)   Resp: 16   SpO2: 99%       Body mass index is 29.7 kg/m?[name]     Pain Score: Zero     Pain Addressed:  N/A    Patient Evaluated for a Clinical Trial: Patient not  eligible for a treatment trial (including not needing treatment, needs palliative care, in remission).     [place] Cooperative Oncology Group performance status is 0, Fully active, able to carry on all pre-disease performance without restriction.[name]     Physical Exam  Vitals reviewed.   Constitutional:       General: She is not in acute distress.     Appearance: Normal appearance. She is well-developed and normal weight. She is not ill-appearing or toxic-appearing.   HENT:      Head: Normocephalic and atraumatic.   Eyes:      General: No scleral icterus.        Right eye: No discharge.         Left eye: No discharge.      Conjunctiva/sclera: Conjunctivae normal.   Cardiovascular:      Rate and Rhythm: Normal rate and regular rhythm.      Heart sounds: Normal heart sounds.   Pulmonary:      Effort: Pulmonary effort is normal. No respiratory distress.      Breath sounds: Normal breath sounds. No wheezing.   Chest:       Abdominal:      General: There is no distension.   Musculoskeletal:         General: No tenderness. Normal range of motion.      Cervical back: Normal range of motion.      Right lower leg: No edema.      Left lower leg: No edema.   Lymphadenopathy:      Cervical: No cervical adenopathy.      Upper Body:      Right upper body: No supraclavicular or axillary adenopathy.      Left upper body: No supraclavicular or axillary adenopathy.   Skin:     General: Skin is warm and dry.      Coloration: Skin is not jaundiced or pale.      Findings: No erythema or rash.   Neurological:      Mental Status: She is alert and oriented to person, place, and time.      Cranial Nerves: No cranial nerve deficit.      Sensory: No sensory deficit.      Motor: No weakness.      Coordination: Coordination normal.      Gait: Gait normal.   Psychiatric:         Behavior: Behavior normal.         Thought Content: Thought content normal.         Judgment: Judgment normal.              Assessment and Plan:  1.  38 y.o. pre-menopausal female with right, multicentric grade II IDC (ER 85%, PR 98%, Her2 1+, Hi-67: 6%).  S/P bilateral mastectomy/R SLNB, pT2N0.  She was found to have 3 areas of malignancy: Tumor #1 at 9:00 measured 2.3cm (ER 90%, PR 100%, Her2 2+, FISH: 1.5 with avg #Her2 signals 4.01 (negative), Ki-67: 6%).  Tumor #2 at 6:00 measured 6 mm (ER 96%, PR 100%, Her2 1+, Ki-67 7%). Tumor #3 in the Left outer quadrant measured 2mm (ER and PR 100%, HER2 IHC 2+, Ki-67 17%, HER2 FISH negative); 4 negative SLN. Completed adjuvant AC-Taxol 02/2015.  Started adjuvant tamoxifen  in 04/2015 and OFS in 12/2015 for rising E2. Zoladex  discontinued 09/2020 due to bothersome side effects and she was nearing 5 years of OFS.   She continued on Tamoxifen  until 11/2021 (bothersome migraines that  improved after stopping tamoxifen ). She is now 9 years from diagnosis, no clinical evidence of disease. Continue observation.     2. Genetic testing. [employer] 26 gene panel testing was negative.    3. Migraines, follows in neurology clinic, well managed.    4. Back pain, follows with spine clinic. Starting PT today.     5. Chest wall tightness/nerve pain. Will see if PT for her back helps with this. She does not want to restart gabapentin . She will reach out if she needs [facility] following PT. Continue to monitor.        RTC in 1 year    Total Time Today was 25 minutes in the following activities: Preparing to see the patient, Obtaining and/or reviewing separately obtained history, Performing a medically appropriate examination and/or evaluation, Ordering medications, tests, or procedures, and Documenting clinical information in the electronic or other health record      Mady CHRISTELLA Skeeter, APRN-NP    Collaborating MD: Priyanka Sharma

## 2024-01-18 ENCOUNTER — Encounter: Admit: 2024-01-18 | Discharge: 2024-01-18 | Payer: BLUE CROSS/BLUE SHIELD

## 2024-01-18 DIAGNOSIS — G43709 Chronic migraine without aura, not intractable, without status migrainosus: Principal | ICD-10-CM

## 2024-01-18 MED ORDER — EMGALITY PEN 120 MG/ML SC PNIJ
120 mg | SUBCUTANEOUS | 5 refills | 30.00000 days | Status: AC
Start: 2024-01-18 — End: ?

## 2024-01-19 ENCOUNTER — Encounter: Admit: 2024-01-19 | Discharge: 2024-01-19 | Payer: BLUE CROSS/BLUE SHIELD

## 2024-02-01 ENCOUNTER — Encounter: Admit: 2024-02-01 | Discharge: 2024-02-01 | Payer: BLUE CROSS/BLUE SHIELD

## 2024-02-01 ENCOUNTER — Ambulatory Visit: Admit: 2024-02-01 | Discharge: 2024-02-02 | Payer: BLUE CROSS/BLUE SHIELD

## 2024-02-01 VITALS — BP 141/88 | HR 83

## 2024-02-01 MED ORDER — ONABOTULINUMTOXINA 100 UNIT IJ SOLR
195 [IU] | Freq: Once | INTRAMUSCULAR | 0 refills | Status: CP | PRN
Start: 2024-02-01 — End: ?

## 2024-02-01 MED ORDER — ONDANSETRON 4 MG PO TBDI
4 mg | ORAL_TABLET | ORAL | 0 refills | 8.00000 days | Status: AC | PRN
Start: 2024-02-01 — End: ?

## 2024-02-01 NOTE — Patient Instructions [37]
 Do NOT massage or apply pressure on the treated area for 4hrs after treatment since Botox may migrate to areas of undesirable effectiveness.   Do NOT lie down for 4 hours after treatment. This is to avoid the risk of pressure on the treated areas. Also Do NOT lean forward.  Avoid rigorous exercise/activities, extensive heat (eg. sauna, hot tub, tanning) and sun exposure, and alcoholic beverages for the first 24 hours after treatment. This may cause temporary redness, swelling, and/or itching at the injection sites. Feel free to shower and go about most other daily activities.  You may experience a mild headache after Botox. Should this occur, we recommend you avoid aspirin or aspirin containing products. You may opt instead to use acetaminophen, and/or cool compresses. Cold compresses may be used 10 minutes on 10 minutes off to reduce swelling 2-3 times per day during the first 1-2 days if needed.    Note that any bumps or marks will go away in a few hours. If you do develop a bruise it will resolve like any other bruises you may have had in about a week. There is occasionally some mild pain, swelling, itching, or redness at the site of injection similar to most other injections. Redness may last for 1-2 days, rarely longer. You may apply cool compresses or take acetaminophen to reduce swelling or discomfort.  Please call the office with any questions or concerns.        It is patient's responsibility to notify the clinic of any insurance changes, at least 2 weeks prior to any Botox injection appointment, to allow for prior authorization update.     Botox Savings Program:  [employer] will reimburse patients up to $1000 per treatment  [email or web address] or [phone] Option 4 to register and submit claims.    You will need your EOB with patient responsibility listed.    If your EOB does not include CPT and J-code you will want to write those on the EOB to prevent delayed processing:   CPT code: 84696  J-code:  [phone]    Preferred method of communication is through MyChart message, if the issue cannot wait until your next scheduled follow up.   MyChart may be used for non-emergent communication. Emails are not reviewed after hours or over the weekend/holidays/after 4PM. Staff will reply to your email within 24-48 business hours.     If you are having acute (new/sudden onset) or severe/worsening neurologic symptoms, please call 911 or seek care in ED.    For scheduling of IMAGING/RADIOLOGY, please call [phone] at your convenience to schedule your studies.  For referrals placed during the visit, if you have not heard from scheduling within one week, please call the call center at [phone] to get scheduling assistance.  For refills on medications, please first contact your pharmacy, who will fax a refill authorization request form to our office.  Weekdays only. Allow up to 2 business days for refills. Please plan ahead, as refills will not be filled after hours or on weekends.    Our front desk staff, Genice Rouge, or Marcelino Duster may be reached at [phone] option 1 for scheduling needs.   My Nurse may be contacted at [phone] option 2 for urgent needs. Staff will return your call within 24 business hours.     For Appointments:   Please try to arrive early for your appointment time to help facilitate your visit. 15 minutes early is recommended.   If you are late to your appointment, we reserve the  right to ask you to reschedule or wait until next available time to be seen in fairness to other patients scheduled that day.   There are times when we are running behind in clinic. Our goal is to always be on time, however, there are time when unexpected events occur with patients, which may cause a delay. We appreciate your understanding when this occurs.

## 2024-02-01 NOTE — Progress Notes [1]
 [facility] CHEMODENERVATION MUSCLE MIGRAINE    Date/Time: 02/01/2024 3:00 PM    Performed by: Georganne Richerd SAUNDERS, APRN-NP  Authorized by: Georganne Richerd SAUNDERS, APRN-NP  Consent obtained: written  Consent given by: patient  Procedural risks discussed:  Allergic reaction, bleeding, nerve damage, headache, damage to surrounding structures, infection, pain, vasovagal reaction, weakness and visual impairment  Risks and benefits: risks and benefits discussed  Preparation: Patient was prepped and draped in the usual sterile fashion.    Anesthesia:  Local anesthesia used: no    Procedure:  Laterality: bilateral  Charges: 64615 - PR CHEMODERVATE FACIAL/TRIGEM/CERV MUSC MIGRAINE  Medications administered: 195 Units ONAbotulinumtoxinA  100 Units/1 mL  Total Units Used: 195  Total Units Wasted: 5  Patient tolerance: patient tolerated the procedure well with no immediate complications  Comments: Currently: Averaging 6-7 migraines per month    Preparation: no contraindications noted to Botox ; possible medications prior to procedure: Emla cream 2.5%/2.5% topically prior to procedure; Preparation of site with alcohol wipes or betadine  Muscles/Sites Injected  A - Bilateral Corrugator - 10 units divided in 2 sites  B - Midline Procerus - 5 units in 1 site  C - Bilateral Frontalis - 20 units divided in 4 sites, additional 10 units divided in 2 sites  D - Bilateral Temporalis - 40 units divided in 8 sites  E - Bilateral Occipitalis - 30 units divided in 6 sites, additional 10 units divided in 2 sites  F - Bilateral Cervical Paraspinals - 20 units divided in 4 sites  G - Bilateral Trapezius - 30 units divided in 6 sites, additional 20 units divided in 4 sites     Total Dose - 195 Units divided in 39 sites       The plan is to be using the PREEMPT paradigm (Aurora SK, et. al. Cephalalgia, 2010;30:793)  with follow the pain method as needed.   Time Out Performed: Identified correct patient name/DOB, injection sites, medication: onabotulinum toxin A, using alcohol preps at sites.   Confirmed: patient, procedure, side, site, safety procedures followed.

## 2024-02-02 DIAGNOSIS — G43709 Chronic migraine without aura, not intractable, without status migrainosus: Principal | ICD-10-CM

## 2024-02-13 ENCOUNTER — Encounter: Admit: 2024-02-13 | Discharge: 2024-02-13 | Payer: BLUE CROSS/BLUE SHIELD

## 2024-02-14 ENCOUNTER — Encounter: Admit: 2024-02-14 | Discharge: 2024-02-14 | Payer: BLUE CROSS/BLUE SHIELD

## 2024-03-04 ENCOUNTER — Encounter: Admit: 2024-03-04 | Discharge: 2024-03-04 | Payer: BLUE CROSS/BLUE SHIELD

## 2024-03-04 ENCOUNTER — Ambulatory Visit: Admit: 2024-03-04 | Discharge: 2024-03-05 | Payer: BLUE CROSS/BLUE SHIELD

## 2024-03-04 VITALS — BP 135/86 | HR 111 | Ht 65.0 in | Wt 180.0 lb

## 2024-03-04 DIAGNOSIS — M898X1 Other specified disorders of bone, shoulder: Principal | ICD-10-CM

## 2024-03-04 MED ORDER — METHYLPREDNISOLONE ACETATE 40 MG/ML IJ SUSP
20 mg | Freq: Once | INTRA_ARTICULAR | 0 refills | Status: CP | PRN
Start: 2024-03-04 — End: ?

## 2024-03-04 MED ORDER — BUPIVACAINE (PF) 0.5 % (5 MG/ML) IJ SOLN
4 mL | Freq: Once | INTRAMUSCULAR | 0 refills | Status: CP | PRN
Start: 2024-03-04 — End: ?

## 2024-03-04 NOTE — Procedures [3]
 Attending Surgeon: Brayton Rank, MD    Anesthesia: Local    Pre-Procedure Diagnosis:   1. Chronic scapular pain    2. Chronic myofascial pain        Post-Procedure Diagnosis:   1. Chronic scapular pain    2. Chronic myofascial pain        Pain Score: Four    Trigger Point  Locations: R cervical/thoracic/lumbar paraspinal and R levator scapulae     Consent:   Consent obtained: written  Consent given by: patient  Alternatives discussed: referral, delayed treatment and alternative treatment     Universal Protocol:  Relevant documents: relevant documents present and verified  Imaging studies: imaging studies available  Required items: required blood products, implants, devices, and special equipment available  Patient identity confirmed: Patient identify confirmed verbally with patient.        Time out: Immediately prior to procedure a time out was called to verify the correct patient, procedure, equipment, support staff and site/side marked as required      Medications (Left): 4 mL bupivacaine  PF 0.5 %; 20 mg methylPREDNISolone  ACETATE 40 mg/mL  Procedures Details:       Indications: Non-invasive conservative therapy is not successful as first line treatment    Conservative Therapies attempted -  Acupuncture, Chiropractic and Physical Therapy  Prep: 2% chlorhexidine  Needle size: 27 G  Number of muscles: 1 or 2    Approach: posterior    Patient tolerance: Patient tolerated the procedure well with no immediate complications. Pressure was applied, and hemostasis was accomplished.    Pre Procedure Pain: 9  Post Procedure Pain: 2            Estimated blood loss: none or minimal  Specimens: none  Patient tolerated the procedure well with no immediate complications. Pressure was applied, and hemostasis was accomplished.

## 2024-03-04 NOTE — Progress Notes [1]
 [facility] CLINIC NOTE       SUBJECTIVE:   History of Present Illness  Debra Fleming is a 38 year old female with a history of breast cancer who presents with chronic thoracic pain.    She has several months of persistent right-sided thoracic pain that is sharp, stinging, and burning, and she finds it debilitating. She relates onset to reaching into the back of her car.    She has tried muscle relaxers, physical therapy, and prior Botox  injections without relief. She describes the pain as a pulling sensation similar to internal stitches from prior breast cancer surgeries. There is no pain with breathing. An MRI is scheduled for March 22, 2024 to further evaluate the cause.             Review of Systems  Current Medications[1]  Allergies[2]  Physical Exam  Vitals:    03/04/24 1548   BP: 135/86   Pulse: 111   SpO2: 97%   PainSc: Four   Weight: 81.6 kg (180 lb)   Height: 165.1 cm (5' 5)        Pain Score: Four  Body mass index is 29.95 kg/m?Debra Fleming  Physical Exam  MUSCULOSKELETAL: Right thoracic spine tenderness on palpation.             IMPRESSION:  1. Chronic scapular pain    2. Chronic myofascial pain          PLAN:   Assessment & Plan  Right periscapular myofascial pain syndrome  Chronic right periscapular myofascial pain with ineffective Botox  treatment and unremarkable X-rays. Differential includes intramuscular trigger point versus disc issue. MRI scheduled to rule out disc pathology.  - Administered intramuscular trigger point injection.  - Proceed with scheduled MRI on December 26th, 2025, to evaluate for disc pathology.              [1]   Current Outpatient Medications:     ALPRAZolam (XANAX) 0.5 mg tablet, TAKE 1 TABLET BY MOUTH TWICE DAILY as needed for anxiety, Disp: , Rfl:     baclofen  (LIORESAL ) 10 mg tablet, Take one tablet by mouth twice daily., Disp: 60 tablet, Rfl: 11    celecoxib  (CELEBREX ) 100 mg capsule, Take one capsule by mouth daily., Disp: 30 capsule, Rfl: 1    doxycycline  monohydrate (MONODOX ) 100 mg capsule, Take one capsule by mouth as Needed., Disp: , Rfl:     EMGALITY  PEN 120 mg/mL subcutaneous PEN, INJECT 1 ML UNDER THE SKIN EVERY 30 DAYS, Disp: 1 mL, Rfl: 5    estradioL (ESTRACE) 0.01 % (0.1 mg/g) vaginal cream, APPLY A PEA SIZED AMOUNT IN THE VAGINA EVERY OTHER DAY AT BEDTIME, Disp: , Rfl:     gabapentin  (NEURONTIN ) 300 mg capsule, take 3 capsules BY MOUTH at bedtime DAILY, Disp: 270 capsule, Rfl: 3    minoxidiL (LONITEN) 2.5 mg tablet, take 1/4 tablet BY MOUTH once daily, Disp: , Rfl:     mirtazapine (REMERON) 15 mg tablet, Take one tablet by mouth at bedtime daily., Disp: , Rfl:     ONAbotulinum toxin A (BOTOX ) 100 Units/1 mL injection, Inject into the muscle as directed. For Migraines., Disp: , Rfl:     ondansetron  (ZOFRAN  ODT) 4 mg rapid dissolve tablet, Dissolve one tablet by mouth every 6 hours as needed. Place on tongue to dissolve., Disp: 30 tablet, Rfl: 0    phentermine (ADIPEX-P) 37.5 mg tablet, Take one tablet by mouth daily before breakfast., Disp: , Rfl:     QUEtiapine (  SEROQUEL) 100 mg tablet, Take one tablet by mouth at bedtime daily., Disp: , Rfl:     rimegepant (NURTEC ODT ) 75 mg rapid dissolve tablet, Dissolve one tablet by mouth daily as needed for Migraine symptoms. Indications: a migraine headache, Disp: 8 tablet, Rfl: 11    tiZANidine  (ZANAFLEX ) 2 mg tablet, Take one tablet by mouth at bedtime daily., Disp: 30 tablet, Rfl: 1    topiramate  (TOPAMAX ) 100 mg tablet, Take one tablet by mouth twice daily. Indications: migraine prevention, Disp: 180 tablet, Rfl: 3  [2]   Allergies  Allergen Reactions    Adhesive RASH and UNKNOWN    Chlorhexidine RASH     CHG dressing    Penicillin G HEADACHE and RASH     childhood allergy    Scopolamine NAUSEA AND VOMITING

## 2024-03-05 DIAGNOSIS — M7918 Myalgia, other site: Secondary | ICD-10-CM

## 2024-03-05 DIAGNOSIS — G8929 Other chronic pain: Secondary | ICD-10-CM

## 2024-03-14 ENCOUNTER — Encounter: Admit: 2024-03-14 | Discharge: 2024-03-14 | Payer: BLUE CROSS/BLUE SHIELD

## 2024-03-22 ENCOUNTER — Encounter: Admit: 2024-03-22 | Discharge: 2024-03-22 | Payer: BLUE CROSS/BLUE SHIELD

## 2024-04-05 ENCOUNTER — Encounter: Admit: 2024-04-05 | Discharge: 2024-04-05 | Payer: BLUE CROSS/BLUE SHIELD

## 2024-04-22 ENCOUNTER — Encounter: Admit: 2024-04-22 | Discharge: 2024-04-22 | Payer: BLUE CROSS/BLUE SHIELD

## 2024-04-22 ENCOUNTER — Ambulatory Visit: Admit: 2024-04-22 | Discharge: 2024-04-23 | Payer: BLUE CROSS/BLUE SHIELD

## 2024-04-22 VITALS — BP 144/93 | HR 84 | Ht 66.0 in | Wt 190.0 lb

## 2024-04-22 DIAGNOSIS — M898X1 Other specified disorders of bone, shoulder: Principal | ICD-10-CM

## 2024-04-22 DIAGNOSIS — M7918 Myalgia, other site: Secondary | ICD-10-CM

## 2024-04-22 MED ORDER — BUPIVACAINE (PF) 0.5 % (5 MG/ML) IJ SOLN
3 mL | Freq: Once | INTRAMUSCULAR | 0 refills | Status: CP | PRN
Start: 2024-04-22 — End: ?

## 2024-04-22 NOTE — Procedures [3]
 Attending Surgeon: Brayton Rank, MD    Anesthesia: Local      [facility] TRIGGER POINT INJECTION  Locations: R levator scapulae, L levator scapulae, L cervical/thoracic/lumbar paraspinal and R cervical/thoracic/lumbar paraspinal     Consent:   Consent obtained: written  Consent given by: patient  Alternatives discussed: referral, delayed treatment and alternative treatment     Universal Protocol:  Relevant documents: relevant documents present and verified  Imaging studies: imaging studies available  Required items: required blood products, implants, devices, and special equipment available  Patient identity confirmed: Patient identify confirmed verbally with patient.        Time out: Immediately prior to procedure a time out was called to verify the correct patient, procedure, equipment, support staff and site/side marked as required      Medications (Left): 3 mL bupivacaine  PF 0.5 %  Procedures Details:       Indications: Non-invasive conservative therapy is not successful as first line treatment    Conservative Therapies attempted -  Acupuncture, Physical Therapy and Chiropractic  Prep: 2% chlorhexidine  Needle size: 27 G  Number of muscles: 3 or more    Approach: posterior    Patient tolerance: Patient tolerated the procedure well with no immediate complications. Pressure was applied, and hemostasis was accomplished.  Right Medication Administered - 3 mL bupivacaine  PF 0.5 %  Left Medication Administered - 3 mL bupivacaine  PF 0.5 %    Pre Procedure Pain: 8  Post Procedure Pain: 2        Estimated blood loss: none or minimal  Specimens: none  Patient tolerated the procedure well with no immediate complications. Pressure was applied, and hemostasis was accomplished.

## 2024-04-23 DIAGNOSIS — M549 Dorsalgia, unspecified: Secondary | ICD-10-CM

## 2024-04-23 DIAGNOSIS — G8929 Other chronic pain: Secondary | ICD-10-CM

## 2024-04-29 ENCOUNTER — Encounter: Admit: 2024-04-29 | Discharge: 2024-04-29 | Payer: BLUE CROSS/BLUE SHIELD

## 2024-05-02 ENCOUNTER — Encounter: Admit: 2024-05-02 | Discharge: 2024-05-02 | Payer: BLUE CROSS/BLUE SHIELD

## 2024-05-02 ENCOUNTER — Ambulatory Visit: Admit: 2024-05-02 | Discharge: 2024-05-03 | Payer: BLUE CROSS/BLUE SHIELD

## 2024-05-02 MED ORDER — ONABOTULINUMTOXINA 100 UNIT IJ SOLR
195 [IU] | Freq: Once | INTRAMUSCULAR | 0 refills | Status: CP | PRN
Start: 2024-05-02 — End: ?

## 2024-05-02 NOTE — Patient Instructions [37]
 Do NOT massage or apply pressure on the treated area for 4hrs after treatment since Botox may migrate to areas of undesirable effectiveness.   Do NOT lie down for 4 hours after treatment. This is to avoid the risk of pressure on the treated areas. Also Do NOT lean forward.  Avoid rigorous exercise/activities, extensive heat (eg. sauna, hot tub, tanning) and sun exposure, and alcoholic beverages for the first 24 hours after treatment. This may cause temporary redness, swelling, and/or itching at the injection sites. Feel free to shower and go about most other daily activities.  You may experience a mild headache after Botox. Should this occur, we recommend you avoid aspirin or aspirin containing products. You may opt instead to use acetaminophen, and/or cool compresses. Cold compresses may be used 10 minutes on 10 minutes off to reduce swelling 2-3 times per day during the first 1-2 days if needed.    Note that any bumps or marks will go away in a few hours. If you do develop a bruise it will resolve like any other bruises you may have had in about a week. There is occasionally some mild pain, swelling, itching, or redness at the site of injection similar to most other injections. Redness may last for 1-2 days, rarely longer. You may apply cool compresses or take acetaminophen to reduce swelling or discomfort.  Please call the office with any questions or concerns.        It is patient's responsibility to notify the clinic of any insurance changes, at least 2 weeks prior to any Botox injection appointment, to allow for prior authorization update.     Botox Savings Program:  [employer] will reimburse patients up to $1000 per treatment  [email or web address] or [phone] Option 4 to register and submit claims.    You will need your EOB with patient responsibility listed.    If your EOB does not include CPT and J-code you will want to write those on the EOB to prevent delayed processing:   CPT code: 84696  J-code:  [phone]    Preferred method of communication is through MyChart message, if the issue cannot wait until your next scheduled follow up.   MyChart may be used for non-emergent communication. Emails are not reviewed after hours or over the weekend/holidays/after 4PM. Staff will reply to your email within 24-48 business hours.     If you are having acute (new/sudden onset) or severe/worsening neurologic symptoms, please call 911 or seek care in ED.    For scheduling of IMAGING/RADIOLOGY, please call [phone] at your convenience to schedule your studies.  For referrals placed during the visit, if you have not heard from scheduling within one week, please call the call center at [phone] to get scheduling assistance.  For refills on medications, please first contact your pharmacy, who will fax a refill authorization request form to our office.  Weekdays only. Allow up to 2 business days for refills. Please plan ahead, as refills will not be filled after hours or on weekends.    Our front desk staff, Genice Rouge, or Marcelino Duster may be reached at [phone] option 1 for scheduling needs.   My Nurse may be contacted at [phone] option 2 for urgent needs. Staff will return your call within 24 business hours.     For Appointments:   Please try to arrive early for your appointment time to help facilitate your visit. 15 minutes early is recommended.   If you are late to your appointment, we reserve the  right to ask you to reschedule or wait until next available time to be seen in fairness to other patients scheduled that day.   There are times when we are running behind in clinic. Our goal is to always be on time, however, there are time when unexpected events occur with patients, which may cause a delay. We appreciate your understanding when this occurs.

## 2024-05-02 NOTE — Progress Notes [1]
 [facility] SP[facility] CHEMODENERVATION MUSCLE MIGRAINE    Date/Time: 05/02/2024 3:00 PM    Performed by: Georganne Richerd SAUNDERS, APRN-NP  Authorized by: Georganne Richerd SAUNDERS, APRN-NP  Consent obtained: written  Consent given by: patient  Procedural risks discussed:  Allergic reaction, bleeding, nerve damage, headache, damage to surrounding structures, infection, pain, vasovagal reaction, weakness and visual impairment  Risks and benefits: risks and benefits discussed  Preparation: Patient was prepped and draped in the usual sterile fashion.    Anesthesia:  Local anesthesia used: no    Procedure:  Laterality: bilateral  Charges: 64615 - PR CHEMODERVATE FACIAL/TRIGEM/CERV MUSC MIGRAINE  Medications administered: 195 Units ONAbotulinumtoxinA  100 Units/1 mL  Total Units Used: 195  Total Units Wasted: 5  Patient tolerance: patient tolerated the procedure well with no immediate complications  Comments: Currently: 1-2 migraines per month    Preparation: no contraindications noted to Botox ; possible medications prior to procedure: Emla cream 2.5%/2.5% topically prior to procedure; Preparation of site with alcohol wipes or betadine  Muscles/Sites Injected  A - Bilateral Corrugator - 10 units divided in 2 sites  B - Midline Procerus - 5 units in 1 site  C - Bilateral Frontalis - 20 units divided in 4 sites, additional 10 units divided in 2 sites  D - Bilateral Temporalis - 40 units divided in 8 sites  E - Bilateral Occipitalis - 30 units divided in 6 sites, additional 10 units divided in 2 sites  F - Bilateral Cervical Paraspinals - 20 units divided in 4 sites  G - Bilateral Trapezius - 30 units divided in 6 sites, additional 20 units divided in 4 sites     Total Dose - 195 Units divided in 39 sites       The plan is to be using the PREEMPT paradigm (Aurora SK, et. al. Cephalalgia, 2010;30:793)  with follow the pain method as needed.   Time Out Performed: Identified correct patient name/DOB, injection sites, medication: onabotulinum toxin A, using alcohol preps at sites.   Confirmed: patient, procedure, side, site, safety procedures followed.

## 2024-05-03 DIAGNOSIS — G43709 Chronic migraine without aura, not intractable, without status migrainosus: Principal | ICD-10-CM
